# Patient Record
Sex: Male | Born: 1955
Health system: Southern US, Community
[De-identification: ages and names within clinical notes are randomized; demographics above are authoritative.]

## PROBLEM LIST (undated history)

## (undated) DIAGNOSIS — Z959 Presence of cardiac and vascular implant and graft, unspecified: Secondary | ICD-10-CM

## (undated) DIAGNOSIS — J309 Allergic rhinitis, unspecified: Secondary | ICD-10-CM

## (undated) DIAGNOSIS — I219 Acute myocardial infarction, unspecified: Secondary | ICD-10-CM

## (undated) DIAGNOSIS — I10 Essential (primary) hypertension: Secondary | ICD-10-CM

## (undated) DIAGNOSIS — E785 Hyperlipidemia, unspecified: Secondary | ICD-10-CM

## (undated) DIAGNOSIS — M1990 Unspecified osteoarthritis, unspecified site: Secondary | ICD-10-CM

## (undated) DIAGNOSIS — F411 Generalized anxiety disorder: Secondary | ICD-10-CM

## (undated) DIAGNOSIS — M545 Low back pain, unspecified: Secondary | ICD-10-CM

## (undated) DIAGNOSIS — Z95810 Presence of automatic (implantable) cardiac defibrillator: Secondary | ICD-10-CM

## (undated) DIAGNOSIS — I2510 Atherosclerotic heart disease of native coronary artery without angina pectoris: Secondary | ICD-10-CM

## (undated) DIAGNOSIS — I480 Paroxysmal atrial fibrillation: Secondary | ICD-10-CM

## (undated) DIAGNOSIS — I469 Cardiac arrest, cause unspecified: Secondary | ICD-10-CM

## (undated) HISTORY — DX: Hyperlipidemia, unspecified: E78.5

## (undated) HISTORY — DX: Cardiac arrest, cause unspecified: I46.9

## (undated) HISTORY — DX: Essential (primary) hypertension: I10

## (undated) HISTORY — DX: Presence of cardiac and vascular implant and graft, unspecified: Z95.9

## (undated) HISTORY — DX: Generalized anxiety disorder: F41.1

## (undated) HISTORY — DX: Allergic rhinitis, unspecified: J30.9

## (undated) HISTORY — DX: Paroxysmal atrial fibrillation: I48.0

## (undated) HISTORY — DX: Low back pain: M54.5

## (undated) HISTORY — DX: Low back pain, unspecified: M54.50

---

## 2003-07-31 ENCOUNTER — Emergency Department (HOSPITAL_COMMUNITY): Admission: EM | Admit: 2003-07-31 | Discharge: 2003-07-31 | Payer: Self-pay | Admitting: *Deleted

## 2003-08-07 ENCOUNTER — Emergency Department (HOSPITAL_COMMUNITY): Admission: EM | Admit: 2003-08-07 | Discharge: 2003-08-07 | Payer: Self-pay | Admitting: Emergency Medicine

## 2004-11-15 ENCOUNTER — Ambulatory Visit: Payer: Self-pay | Admitting: Internal Medicine

## 2004-11-30 ENCOUNTER — Ambulatory Visit: Payer: Self-pay | Admitting: Internal Medicine

## 2005-07-17 ENCOUNTER — Ambulatory Visit: Payer: Self-pay | Admitting: Internal Medicine

## 2005-11-27 ENCOUNTER — Emergency Department (HOSPITAL_COMMUNITY): Admission: EM | Admit: 2005-11-27 | Discharge: 2005-11-27 | Payer: Self-pay | Admitting: Emergency Medicine

## 2005-11-29 ENCOUNTER — Ambulatory Visit: Payer: Self-pay | Admitting: Internal Medicine

## 2006-03-13 ENCOUNTER — Ambulatory Visit: Payer: Self-pay | Admitting: Internal Medicine

## 2007-04-02 ENCOUNTER — Ambulatory Visit: Payer: Self-pay | Admitting: Internal Medicine

## 2007-04-02 LAB — CONVERTED CEMR LAB
ALT: 18 units/L (ref 0–40)
AST: 23 units/L (ref 0–37)
Albumin: 4.2 g/dL (ref 3.5–5.2)
Alkaline Phosphatase: 84 units/L (ref 39–117)
BUN: 5 mg/dL — ABNORMAL LOW (ref 6–23)
Bacteria, UA: NEGATIVE
Basophils Absolute: 0 10*3/uL (ref 0.0–0.1)
Basophils Relative: 0.3 % (ref 0.0–1.0)
Bilirubin Urine: NEGATIVE
Bilirubin, Direct: 0.1 mg/dL (ref 0.0–0.3)
CO2: 31 meq/L (ref 19–32)
Calcium: 9.3 mg/dL (ref 8.4–10.5)
Chloride: 107 meq/L (ref 96–112)
Cholesterol: 240 mg/dL (ref 0–200)
Creatinine, Ser: 0.7 mg/dL (ref 0.4–1.5)
Crystals: NEGATIVE
Direct LDL: 169.7 mg/dL
Eosinophils Absolute: 0 10*3/uL (ref 0.0–0.6)
Eosinophils Relative: 0.4 % (ref 0.0–5.0)
GFR calc Af Amer: 154 mL/min
GFR calc non Af Amer: 127 mL/min
Glucose, Bld: 95 mg/dL (ref 70–99)
HCT: 45.4 % (ref 39.0–52.0)
HDL: 45.4 mg/dL (ref >39.0)
Hemoglobin, Urine: NEGATIVE
Hemoglobin: 15.5 g/dL (ref 13.0–17.0)
Ketones, ur: NEGATIVE mg/dL
Leukocytes, UA: NEGATIVE
Lymphocytes Relative: 27.2 % (ref 12.0–46.0)
MCHC: 34.3 g/dL (ref 30.0–36.0)
MCV: 96.4 fL (ref 78.0–100.0)
Monocytes Absolute: 0.8 10*3/uL — ABNORMAL HIGH (ref 0.2–0.7)
Monocytes Relative: 8.8 % (ref 3.0–11.0)
Mucus, UA: NEGATIVE
Neutro Abs: 5.7 10*3/uL (ref 1.4–7.7)
Neutrophils Relative %: 63.3 % (ref 43.0–77.0)
Nitrite: NEGATIVE
PSA: 0.31 ng/mL (ref 0.10–4.00)
Platelets: 276 10*3/uL (ref 150–400)
Potassium: 4.3 meq/L (ref 3.5–5.1)
RBC / HPF: NONE SEEN
RBC: 4.71 M/uL (ref 4.22–5.81)
RDW: 13.1 % (ref 11.5–14.6)
Sodium: 143 meq/L (ref 135–145)
Specific Gravity, Urine: <=1.005 (ref 1.000–1.03)
Squamous Epithelial / LPF: NEGATIVE /lpf
TSH: 1.53 microintl units/mL (ref 0.35–5.50)
Total Bilirubin: 0.7 mg/dL (ref 0.3–1.2)
Total CHOL/HDL Ratio: 5.3
Total Protein, Urine: NEGATIVE mg/dL
Total Protein: 7.4 g/dL (ref 6.0–8.3)
Triglycerides: 147 mg/dL (ref 0–149)
Urine Glucose: NEGATIVE mg/dL
Urobilinogen, UA: 0.2 (ref 0.0–1.0)
VLDL: 29 mg/dL (ref 0–40)
WBC, UA: NONE SEEN cells/hpf
WBC: 8.9 10*3/uL (ref 4.5–10.5)
pH: 6.5 (ref 5.0–8.0)

## 2007-07-15 ENCOUNTER — Ambulatory Visit: Payer: Self-pay | Admitting: Internal Medicine

## 2007-07-15 LAB — CONVERTED CEMR LAB
Cholesterol: 200 mg/dL (ref 0–200)
HDL: 49.4 mg/dL (ref >39.0)
LDL Cholesterol: 133 mg/dL — ABNORMAL HIGH (ref 0–99)
Total CHOL/HDL Ratio: 4
Triglycerides: 87 mg/dL (ref 0–149)
VLDL: 17 mg/dL (ref 0–40)

## 2007-07-16 DIAGNOSIS — M545 Low back pain, unspecified: Secondary | ICD-10-CM | POA: Insufficient documentation

## 2007-07-16 DIAGNOSIS — F411 Generalized anxiety disorder: Secondary | ICD-10-CM | POA: Insufficient documentation

## 2007-07-16 DIAGNOSIS — J309 Allergic rhinitis, unspecified: Secondary | ICD-10-CM | POA: Insufficient documentation

## 2007-07-16 DIAGNOSIS — I10 Essential (primary) hypertension: Secondary | ICD-10-CM | POA: Insufficient documentation

## 2007-09-03 ENCOUNTER — Ambulatory Visit: Payer: Self-pay | Admitting: Internal Medicine

## 2007-09-03 LAB — CONVERTED CEMR LAB
ALT: 24 units/L (ref 0–53)
AST: 27 units/L (ref 0–37)
Albumin: 4 g/dL (ref 3.5–5.2)
Alkaline Phosphatase: 102 units/L (ref 39–117)
Bilirubin, Direct: 0.1 mg/dL (ref 0.0–0.3)
Cholesterol: 194 mg/dL (ref 0–200)
HDL: 38.3 mg/dL — ABNORMAL LOW (ref >39.0)
LDL Cholesterol: 122 mg/dL — ABNORMAL HIGH (ref 0–99)
Total Bilirubin: 0.6 mg/dL (ref 0.3–1.2)
Total CHOL/HDL Ratio: 5.1
Total Protein: 7.6 g/dL (ref 6.0–8.3)
Triglycerides: 171 mg/dL — ABNORMAL HIGH (ref 0–149)
VLDL: 34 mg/dL (ref 0–40)

## 2008-08-04 ENCOUNTER — Telehealth: Payer: Self-pay | Admitting: Internal Medicine

## 2009-09-01 ENCOUNTER — Ambulatory Visit: Payer: Self-pay | Admitting: Internal Medicine

## 2009-10-07 ENCOUNTER — Ambulatory Visit: Payer: Self-pay | Admitting: Internal Medicine

## 2009-12-24 ENCOUNTER — Ambulatory Visit: Payer: Self-pay | Admitting: Internal Medicine

## 2009-12-24 DIAGNOSIS — E785 Hyperlipidemia, unspecified: Secondary | ICD-10-CM | POA: Insufficient documentation

## 2009-12-25 LAB — CONVERTED CEMR LAB
ALT: 26 units/L (ref 0–53)
AST: 28 units/L (ref 0–37)
Albumin: 4.5 g/dL (ref 3.5–5.2)
Alkaline Phosphatase: 85 units/L (ref 39–117)
BUN: 11 mg/dL (ref 6–23)
Basophils Absolute: 0 10*3/uL (ref 0.0–0.1)
Basophils Relative: 0 % (ref 0.0–3.0)
Bilirubin Urine: NEGATIVE
Bilirubin, Direct: <0.1 mg/dL (ref 0.0–0.3)
CO2: 19 meq/L (ref 19–32)
Calcium: 8.9 mg/dL (ref 8.4–10.5)
Chloride: 105 meq/L (ref 96–112)
Cholesterol: 193 mg/dL (ref 0–200)
Creatinine, Ser: 0.67 mg/dL (ref 0.40–1.50)
Eosinophils Absolute: 0.1 10*3/uL (ref 0.0–0.7)
Eosinophils Relative: 1.1 % (ref 0.0–5.0)
Glucose, Bld: 71 mg/dL (ref 70–99)
HCT: 44.5 % (ref 39.0–52.0)
HDL: 39 mg/dL — ABNORMAL LOW (ref >39)
Hemoglobin, Urine: NEGATIVE
Hemoglobin: 14.6 g/dL (ref 13.0–17.0)
Ketones, ur: NEGATIVE mg/dL
LDL Cholesterol: 120 mg/dL — ABNORMAL HIGH (ref 0–99)
Leukocytes, UA: NEGATIVE
Lymphocytes Relative: 33.1 % (ref 12.0–46.0)
Lymphs Abs: 2.8 10*3/uL (ref 0.7–4.0)
MCHC: 32.9 g/dL (ref 30.0–36.0)
MCV: 98.8 fL (ref 78.0–100.0)
Monocytes Absolute: 0.7 10*3/uL (ref 0.1–1.0)
Monocytes Relative: 7.7 % (ref 3.0–12.0)
Neutro Abs: 4.9 10*3/uL (ref 1.4–7.7)
Neutrophils Relative %: 58.1 % (ref 43.0–77.0)
Nitrite: NEGATIVE
PSA: 0.43 ng/mL (ref 0.10–4.00)
Platelets: 254 10*3/uL (ref 150.0–400.0)
Potassium: 4.6 meq/L (ref 3.5–5.3)
RBC: 4.5 M/uL (ref 4.22–5.81)
RDW: 12.4 % (ref 11.5–14.6)
Sodium: 141 meq/L (ref 135–145)
Specific Gravity, Urine: <=1.005 (ref 1.000–1.030)
TSH: 0.92 microintl units/mL (ref 0.35–5.50)
Total Bilirubin: 0.5 mg/dL (ref 0.3–1.2)
Total CHOL/HDL Ratio: 4.9
Total Protein, Urine: NEGATIVE mg/dL
Total Protein: 8 g/dL (ref 6.0–8.3)
Triglycerides: 171 mg/dL — ABNORMAL HIGH (ref <150)
Urine Glucose: NEGATIVE mg/dL
Urobilinogen, UA: 0.2 (ref 0.0–1.0)
VLDL: 34 mg/dL (ref 0–40)
WBC: 8.5 10*3/uL (ref 4.5–10.5)
pH: 6.5 (ref 5.0–8.0)

## 2010-05-13 ENCOUNTER — Ambulatory Visit: Payer: Self-pay | Admitting: Internal Medicine

## 2010-10-21 ENCOUNTER — Ambulatory Visit: Payer: Self-pay | Admitting: Internal Medicine

## 2010-12-08 ENCOUNTER — Ambulatory Visit
Admission: RE | Admit: 2010-12-08 | Discharge: 2010-12-08 | Payer: Self-pay | Source: Home / Self Care | Attending: Internal Medicine | Admitting: Internal Medicine

## 2011-01-10 NOTE — Progress Notes (Signed)
Summary: flexeril denial  Phone Note Refill Request Message from:  Fax from Pharmacy on August 04, 2008 12:25 PM  Refills Requested: Medication #1:  Cyclobenzaprine 5mg    Notes: TID Not on pt's EMR med list, pt last seen 09/03/07. Is this ok [facility] [address] fax [phone]  Initial call taken by: Rock Nephew CMA,  August 04, 2008 12:27 PM  Follow-up for Phone Call        pt not seen for > 1 yr - this is controlled med - needs ROV Follow-up by: Corwin Levins MD,  August 04, 2008 12:34 PM  Additional Follow-up for Phone Call Additional follow up Details #1::        pharmacy notified Additional Follow-up by: Rock Nephew CMA,  August 04, 2008 1:28 PM

## 2011-01-10 NOTE — Assessment & Plan Note (Signed)
Summary: headache/face pain/cd   Vital Signs:  Patient profile:   55 year old male Height:      72 inches Weight:      175.50 pounds BMI:     23.89 O2 Sat:      96 % on Room air Temp:     97.7 degrees F oral Pulse rate:   78 / minute BP sitting:   152 / 80  (left arm) Cuff size:   regular  Vitals Entered By: Zella Ball Ewing CMA Duncan Dull) (October 21, 2010 2:17 PM)  O2 Flow:  Room air  CC: Headache, face pain, sore throat, right ear pain/RE   Primary Care Provider:  Corwin Levins MD  CC:  Headache, face pain, sore throat, and right ear pain/RE.  History of Present Illness: here with acute onset mild to mod 2-3 days facial pain, pressure, fever, headache,  and greenish d/c; with mild ST, no cough and Pt denies CP, worsening sob, doe, wheezing, orthopnea, pnd, worsening LE edema, palps, dizziness or syncope  Pt denies new neuro symptoms such as headache, facial or extremity weakness Pt denies polydipsia, polyuria.  No recent wt loss, night sweats, loss of appetite or other constitutional symptoms . Denies worsening depressive symptoms, suicidal ideation, or panic, and some less anxious recently after he has been able to transition to a new job.  BP at home < 140/90.  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    Problems Prior to Update: 1)  Sinusitis- Acute-nos  (ICD-461.9) 2)  Blepharitis, Unspecified  (ICD-373.00) 3)  Hyperlipidemia  (ICD-272.4) 4)  Otitis Media, Right  (ICD-382.9) 5)  Preventive Health Care  (ICD-V70.0) 6)  Sialadenitis, Left  (ICD-527.2) 7)  Hypertension  (ICD-401.9) 8)  Low Back Pain  (ICD-724.2) 9)  Anxiety  (ICD-300.00) 10)  Allergic Rhinitis  (ICD-477.9)  Medications Prior to Update: 1)  Alprazolam 0.25 Mg  Tbdp (Alprazolam) .Marland Kitchen.. 1 By Mouth Two Times A Day Prn 2)  Flexeril 5 Mg Tabs (Cyclobenzaprine Hcl) .Marland Kitchen.. 1 By Mouth Three Times A Day As  Needed 3)  Doxycycline Hyclate 100 Mg Caps (Doxycycline Hyclate) .Marland Kitchen.. 1po Two Times A Day 4)  Erythromycin 5 Mg/gm Oint (Erythromycin) .... Use Asd Four Times Per Day For 10 Days  Current Medications (verified): 1)  Alprazolam 0.25 Mg  Tbdp (Alprazolam) .Marland Kitchen.. 1 By Mouth Two Times A Day Prn 2)  Flexeril 5 Mg Tabs (Cyclobenzaprine Hcl) .Marland Kitchen.. 1 By Mouth Three Times A Day As Needed 3)  Levofloxacin 500 Mg Tabs (Levofloxacin) .Marland Kitchen.. 1po Once Daily  Allergies (verified): 1)  ! 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  , mild ill  Head:  normocephalic and atraumatic.   Eyes:  vision grossly intact, pupils equal, and pupils round.   Ears:  right and left TM's mild erythema, canals clear, sinus tender bilat Nose:  nasal dischargemucosal pallor and mucosal edema.   Mouth:  pharyngeal erythema  and fair dentition.   Neck:  supple and no masses.   Lungs:  normal respiratory effort and normal breath sounds.   Heart:  normal rate and regular rhythm.   Msk:  no joint tenderness and no joint swelling.  , spine nontender, mild bilat lumbar paravertebral tender noted Extremities:  no edema, no erythema  Neurologic:  strength normal in all extremities and gait normal.   Psych:  not anxious appearing and not depressed appearing.     Impression & Recommendations:  Problem # 1:  SINUSITIS- ACUTE-NOS (ICD-461.9)  His updated medication list for this problem includes:    Levofloxacin 500 Mg Tabs (Levofloxacin)  .Marland Kitchen... 1po once daily treat as above, f/u any worsening signs or symptoms   Problem # 2:  ANXIETY (ICD-300.00)  His updated medication list for this problem includes:    Alprazolam 0.25 Mg Tbdp (Alprazolam) .Marland Kitchen... 1 by mouth two times a day prn stable overall by hx and exam, ok to continue meds/tx as is   Problem # 3:  LOW BACK PAIN (ICD-724.2)  His updated medication list for this problem includes:    Flexeril 5 Mg Tabs (Cyclobenzaprine hcl) .Marland Kitchen... 1 by mouth three times a day as needed stable overall by hx and exam, ok to continue meds/tx as is   Problem # 4:  HYPERTENSION (ICD-401.9)  BP today: 152/80 Prior BP: 150/100 (05/13/2010)  Labs Reviewed: K+: 4.6 (12/24/2009) Creat: : 0.67 (12/24/2009)   Chol: 193 (12/24/2009)   HDL: 39 (12/24/2009)   LDL: 120 (12/24/2009)   TG: 171 (12/24/2009) I suspect he  needs BP med, pt decines, mild elev today, ? situational, to follow closely , continue same treatment  - avoid salt, wt control, regular excercise, ETOH in moderation  Complete Medication List: 1)  Alprazolam 0.25 Mg Tbdp (Alprazolam) .Marland Kitchen.. 1 by mouth two times a day prn 2)  Flexeril 5 Mg Tabs (Cyclobenzaprine hcl) .Marland Kitchen.. 1 by mouth three times a day as needed 3)  Levofloxacin 500 Mg Tabs (Levofloxacin) .Marland Kitchen.. 1po once daily  Patient Instructions: 1)  Please take all new medications as prescribed 2)  Continue all previous medications as before this visit  3)  You can also use Mucinex OTC or it's generic for congestion 4)  Check your Blood Pressure regularly. If it is above 140/90: you should make an appointment. 5)  Please schedule a follow-up appointment in 2 months for CPX with labs Prescriptions: FLEXERIL 5 MG TABS (CYCLOBENZAPRINE HCL) 1 by mouth three times a day as needed  #60 x 1   Entered and Authorized by:   Corwin Levins MD   Signed by:   Corwin Levins MD on 10/21/2010   Method used:   Print then Give to Patient   RxID:   [id number] ALPRAZOLAM 0.25 MG  TBDP  (ALPRAZOLAM) 1 by mouth two times a day prn  #60 x 1   Entered and Authorized by:   Corwin Levins MD   Signed by:   Corwin Levins MD on 10/21/2010   Method used:   Print then Give to Patient   RxID:   [id number] LEVOFLOXACIN 500 MG TABS (LEVOFLOXACIN) 1po once daily  #10 x 0   Entered and Authorized by:   Corwin Levins MD   Signed by:   Corwin Levins MD on 10/21/2010   Method used:   Print then Give to Patient   RxID:   [id number]    Orders Added: 1)  Est. Patient Level  IV [id number]

## 2011-01-10 NOTE — Assessment & Plan Note (Signed)
Summary: SOMETHING IN EYE OR SOMETHING BIT HIM / EYE IS SWOLLEN/NWS   Vital Signs:  Patient profile:   55 year old male Height:      72 inches Weight:      171 pounds BMI:     23.28 O2 Sat:      98 % on Room air Temp:     97.3 degrees F oral BP sitting:   150 / 100  (left arm) Cuff size:   regular  Vitals Entered ByZella Ball Ewing (May 13, 2010 10:24 AM)  O2 Flow:  Room air  CC: Left eye red, swollen for 1 week/RE   Referring Provider:  21  Primary Care Provider:  Corwin Levins MD  CC:  Left eye red and swollen for 1 week/RE.  History of Present Illness: overall doing ok, except for new onset 1 wk gradually worsening eyelid red, pain, tender and sweling with a small aspect of the mid lower lid with a firmness to it;  has slight itching but more pain than itch, no other rash to arms or other to suggest contact derm;  and has some watering of the eye as well (clearish) without vision problem or blurriness;  no high fever, chills, headache, ST, cough and Pt denies CP, sob, doe, wheezing, orthopnea, pnd, worsening LE edema, palps, dizziness or syncope   Overall good compliance with meds, good tolerance as well.    Preventive Screening-Counseling & Management      Drug Use:  no.    Problems Prior to Update: 1)  Blepharitis, Unspecified  (ICD-373.00) 2)  Hyperlipidemia  (ICD-272.4) 3)  Otitis Media, Right  (ICD-382.9) 4)  Preventive Health Care  (ICD-V70.0) 5)  Sialadenitis, Left  (ICD-527.2) 6)  Hypertension  (ICD-401.9) 7)  Low Back Pain  (ICD-724.2) 8)  Anxiety  (ICD-300.00) 9)  Allergic Rhinitis  (ICD-477.9)  Medications Prior to Update: 1)  Alprazolam 0.25 Mg  Tbdp (Alprazolam) .Marland Kitchen.. 1 By Mouth Two Times A Day Prn 2)  Flexeril 5 Mg Tabs (Cyclobenzaprine Hcl) .Marland Kitchen.. 1 By Mouth Three Times A Day As Needed 3)  Clarithromycin 500 Mg Tabs (Clarithromycin) .Marland Kitchen.. 1po Two Times A Day  Current Medications (verified): 1)  Alprazolam 0.25 Mg  Tbdp (Alprazolam) .Marland Kitchen.. 1 By Mouth Two  Times A Day Prn 2)  Flexeril 5 Mg Tabs (Cyclobenzaprine Hcl) .Marland Kitchen.. 1 By Mouth Three Times A Day As Needed 3)  Doxycycline Hyclate 100 Mg Caps (Doxycycline Hyclate) .Marland Kitchen.. 1po Two Times A Day 4)  Erythromycin 5 Mg/gm Oint (Erythromycin) .... Use Asd Four Times Per Day For 10 Days  Allergies (verified): 1)  ! 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   Head:  normocephalic and atraumatic.   Eyes:  vision grossly intact, pupils equal, and pupils round;  lower left lid with 2+ red, tender , swelling with very  small approx 5 mm?  firm area to mid lower lid adjacent to the eyelid edge   Ears:  R ear normal and L ear normal.   Nose:  no external deformity and no nasal discharge.   Mouth:  no gingival abnormalities and pharynx pink and moist.   Neck:  supple and no masses.   Lungs:  normal respiratory effort and normal breath sounds.   Heart:  normal rate and regular rhythm.   Extremities:  no edema, no erythema    Impression & Recommendations:  Problem # 1:  BLEPHARITIS, UNSPECIFIED (ICD-373.00) mild, for antibx treatment, has ? evolving hordoleum, but doubt abscess; to follow closely for any worsening symptoms  Problem # 2:  HYPERTENSION (ICD-401.9)  mild elev today, likely situational, ok to  follow, continue same treatment   BP today: 150/100 Prior BP: 138/88 (12/24/2009)  Labs Reviewed: K+: 4.6 (12/24/2009) Creat: : 0.67 (12/24/2009)   Chol: 193 (12/24/2009)   HDL: 39 (12/24/2009)   LDL: 120 (12/24/2009)   TG: 171 (12/24/2009)  Complete Medication List: 1)  Alprazolam 0.25 Mg Tbdp (Alprazolam) .Marland Kitchen.. 1 by mouth two times a day prn 2)  Flexeril 5 Mg Tabs (Cyclobenzaprine hcl) .Marland Kitchen.. 1 by mouth three times a day as needed 3)  Doxycycline Hyclate 100 Mg Caps (Doxycycline hyclate) .Marland Kitchen.. 1po two times a day 4)  Erythromycin 5 Mg/gm Oint (Erythromycin) .... Use asd four times per day for 10 days  Patient Instructions: 1)  Please take all new medications as prescribed 2)  Continue all previous medications as before this visit  3)  Please schedule a follow-up appointment in Jan 2012 with CPX labs Prescriptions: ERYTHROMYCIN 5 MG/GM OINT (ERYTHROMYCIN) use asd four times per day for 10 days  #1 x 0   Entered and Authorized by:   Corwin Levins MD   Signed by:   Corwin Levins MD on 05/13/2010   Method used:   Print then Give to Patient   RxID:   [id number] DOXYCYCLINE HYCLATE 100 MG CAPS (DOXYCYCLINE HYCLATE) 1po two times a day  #20 x 0   Entered and Authorized by:   Corwin Levins MD   Signed by:   Corwin Levins MD on 05/13/2010   Method used:   Print then Give to Patient   RxID:   [id number]

## 2011-01-10 NOTE — Assessment & Plan Note (Signed)
Summary: DR Sammuel Cooper PT  EAR ACHE-SORE THROAT-FEVER-X 2 DYS-#-STC   Vital Signs:  Patient profile:   55 year old male O2 Sat:      97 % Temp:     98.5 degrees F (36.94 degrees C) oral Pulse rate:   67 / minute BP sitting:   172 / 100  (left arm) Cuff size:   regular  Vitals Entered By: Orlan Leavens (September 01, 2009 10:28 AM) CC: earache/ sore throat/ fever x's 3 days Is Patient Diabetic? No Pain Assessment Patient in pain? no        Primary Care Provider:  Corwin Levins MD  CC:  earache/ sore throat/ fever x's 3 days.  History of Present Illness: here with earache and soreness to left side of face onset 3 days ago +fever this AM a/w sore throat no cough or chest pain no sneezing or nasal discharge no drainage from ear no teeth pain or recent dental work improved pain with OTC advil x 1 this AM worsening pain symptoms each day with increasing swelling at "jaw bend"  Current Medications (verified): 1)  Alprazolam 0.25 Mg  Tbdp (Alprazolam) .Marland Kitchen.. 1 By Mouth Two Times A Day Prn  Allergies (verified): 1)  ! Demerol  Past History:  Past Medical History: Allergic rhinitis Anxiety Low back pain Hypertension  Social History: works at [employer] lives with wife and teenage dtr  Review of Systems  The patient denies decreased hearing, hoarseness, headaches, and suspicious skin lesions.    Physical Exam  General:  alert, well-developed, well-nourished, and cooperative to examination.    Head:  +tender diffuse firm swelling over left parotid region - no individual mass appreciated within gland Eyes:  vision grossly intact; pupils equal, round and reactive to light.  conjunctiva and lids normal.    Ears:  erythema of left ear canal without exudate or drainage - TMs hazy but no effusion bilaterally - Nose:  External nasal examination shows no deformity or inflammation. Nasal mucosa are pink and moist without lesions or exudates. Mouth:  teeth and gums in good  repair; mucous membranes moist, without lesions or ulcers. oropharynx clear without exudate, erythema. unable to express pus or stone from left parotid salivary gland bit +erythema and inflammation in the region Lungs:  normal respiratory effort, no intercostal retractions or use of accessory muscles; normal breath sounds bilaterally - no crackles and no wheezes.    Heart:  normal rate, regular rhythm, no murmur, and no rub. BLE without edema   Impression & Recommendations:  Problem # 1:  SIALADENITIS, LEFT (ICD-527.2)  a/w pain, fever and swelling over left parotid region concerning for infection unable to express salivary stone today treat with 7 d abx and silogouges to increase salivary production - push by mouth fluids followup if continued fever to r/o abcess and get ENT referral but plan emperic tx without consultation at this time as nontoxic  Orders: Prescription Created Electronically [phone])  Problem # 2:  HYPERTENSION (ICD-401.9) pt denies dx - "i am just stressed and sick" reviewed criteria of HTN dx and medical risks of untreated HTN - pt agrees to follow up with PCP in 4 weeks and consider tx if still high educated on DASH diet and need for exercise + weight control BP today: 172/100  Labs Reviewed: K+: 4.3 (04/02/2007) Creat: : 0.7 (04/02/2007)   Chol: 194 (09/03/2007)   HDL: 38.3 (09/03/2007)   LDL: 122 (09/03/2007)   TG: 171 (09/03/2007)  Complete Medication List: 1)  Alprazolam 0.25 Mg Tbdp (Alprazolam) .Marland Kitchen.. 1 by mouth two times a day prn 2)  Augmentin 875-125 Mg Tabs (Amoxicillin-pot clavulanate) .Marland Kitchen.. 1 by mouth two times a day 3)  Pilocarpine Hcl 5 Mg Tabs (Pilocarpine hcl) .Marland Kitchen.. 1 by mouth three times a day  Patient Instructions: 1)  take augmentin (antibiotic) as instructed for the infection (sorry - no once daily alternative!) 2)  Take 400-600mg  of Ibuprofen (Advil, Motrin) with food every 4-6 hours as needed for relief of pain or comfort of fever. 3)  also  take piilocarpine three times a day to stimulate saliva production to help clear infection and drink as much fluid as you can tolerate for the next few days. 4)  May place ice pack to left chin/cheek area for pain/swelling as needed  5)  Take your antibiotic as prescribed until ALL of it is gone, but stop if you develop a rash or swelling and contact our office as soon as possible. 6)  Please schedule a follow-up appointment in 1 month to recheck blood pressure, sooner if problems. Prescriptions: PILOCARPINE HCL 5 MG TABS (PILOCARPINE HCL) 1 by mouth three times a day  #21 x 0   Entered and Authorized by:   Newt Lukes MD   Signed by:   Newt Lukes MD on 09/01/2009   Method used:   [facility] to        [facility] [phone][facility] (retail)       [address], [place]  [address]       Ph: [phone] or [phone]       Fax: [phone]   RxID:   [id number] AUGMENTIN 875-125 MG TABS (AMOXICILLIN-POT CLAVULANATE) 1 by mouth two times a day  #14 x 0   Entered and Authorized by:   Newt Lukes MD   Signed by:   Newt Lukes MD on 09/01/2009   Method used:   [facility]        [facility] [phone]* (retail)       [address], [place]  [address]       Ph: [phone] or [phone]       Fax: [phone]   RxID:   [id number]    Orders Added: 1)  Prescription Created Electronically [G8553] 2)  Est. Patient Level IV [02585]    Immunization History:  Tetanus/Td Immunization History:    Tetanus/Td:  historical (12/11/2005)

## 2011-01-10 NOTE — Assessment & Plan Note (Signed)
Summary: SORE THROAT/ SWOLLEN GLANDS/NWS  #   Vital Signs:  Patient profile:   55 year old male Height:      72 inches Weight:      174 pounds BMI:     23.68 O2 Sat:      97 % on Room air Temp:     97.2 degrees F oral Pulse rate:   73 / minute BP sitting:   138 / 88  (right arm) Cuff size:   large  Vitals Entered ByMarland Kitchen Zella Ball Ewing (December 24, 2009 12:21 PM)  O2 Flow:  Room air  Primary Care Provider:  Corwin Levins MD   History of Present Illness: overall doing well, incidently here with right earache, fever and congestion wtihout vertigo, dizziness or hearing loss; does have some right periauricular tender as well without erythema, swelling or ulceration;  no sinus symptom, ST, and Pt denies CP, sob, doe, wheezing, orthopnea, pnd, worsening LE edema, palps, dizziness or syncope   Pt denies new neuro symptoms such as headache, facial or extremity weakness .  Not really trying to follow low chol diet.  Problems Prior to Update: 1)  Otitis Media, Right  (ICD-382.9) 2)  Preventive Health Care  (ICD-V70.0) 3)  Sialadenitis, Left  (ICD-527.2) 4)  Hypertension  (ICD-401.9) 5)  Low Back Pain  (ICD-724.2) 6)  Anxiety  (ICD-300.00) 7)  Allergic Rhinitis  (ICD-477.9)  Medications Prior to Update: 1)  Alprazolam 0.25 Mg  Tbdp (Alprazolam) .Marland Kitchen.. 1 By Mouth Two Times A Day Prn  Current Medications (verified): 1)  Alprazolam 0.25 Mg  Tbdp (Alprazolam) .Marland Kitchen.. 1 By Mouth Two Times A Day Prn 2)  Flexeril 5 Mg Tabs (Cyclobenzaprine Hcl) .Marland Kitchen.. 1 By Mouth Three Times A Day As Needed 3)  Clarithromycin 500 Mg Tabs (Clarithromycin) .Marland Kitchen.. 1po Two Times A Day  Allergies (verified): 1)  ! Demerol  Past History:  Past Surgical History: Last updated: 07/16/2007 Denies surgical history  Social History: Last updated: 12/24/2009 works at [employer] lives with wife and teenage dtr work - [employer]  Risk Factors: Smoking Status: current (07/16/2007)  Past Medical History: Allergic  rhinitis Anxiety Low back pain Hypertension Hyperlipidemia  Family History: Reviewed history and no changes required. htn  Social History: Reviewed history from 09/01/2009 and no changes required. 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         all otherwise negative per pt -   Physical Exam  General:  alert and overweight-appearing.  , mild ill  Head:  normocephalic and atraumatic.   Eyes:  vision grossly intact, pupils equal, and pupils round.   Ears:  right TM severe erythema, left TM ok Nose:  no external deformity and no nasal discharge.   Mouth:  pharyngeal erythema and fair dentition.   Neck:  supple and no masses.   Lungs:  normal respiratory effort and normal breath sounds.   Heart:  normal rate and regular rhythm.   Abdomen:  soft, non-tender, and normal bowel sounds.   Msk:  no joint tenderness and no joint swelling.   Extremities:  no edema, no erythema  Neurologic:  cranial nerves II-XII intact and strength normal in all extremities.     Impression & Recommendations:  Problem # 1:  Preventive Health Care (ICD-V70.0)  Overall doing well, updated the age appropriate counseling and education;  routine health screening/prevention reviewed and done as appropriate unless declined, immunizations up to date or declined, diet counseling done if overweight, urged to quit smoking if smokes , most recent labs reviewed and current ordered if appropriate, ecg  reviewed or declined   Orders: TLB-BMP (Basic Metabolic Panel-BMET) (80048-METABOL) TLB-CBC Platelet - w/Differential (85025-CBCD) TLB-Hepatic/Liver Function Pnl (80076-HEPATIC) TLB-Lipid Panel (80061-LIPID) TLB-TSH (Thyroid Stimulating Hormone) (84443-TSH) TLB-PSA (Prostate Specific Antigen) (84153-PSA) TLB-Udip ONLY (81003-UDIP)  Problem # 2:  OTITIS MEDIA, RIGHT (ICD-382.9)  His updated medication list for this problem includes:    Clarithromycin 500 Mg Tabs (Clarithromycin) .Marland Kitchen... 1po two times a day treat as above, f/u any worsening signs or symptoms   Problem # 3:  HYPERTENSION (ICD-401.9)  BP today: 138/88 Prior BP: 172/100 (09/01/2009)  Labs Reviewed: K+: 4.3 (04/02/2007) Creat: : 0.7 (04/02/2007)   Chol: 194 (09/03/2007)   HDL: 38.3 (09/03/2007)   LDL: 122 (09/03/2007)   TG: 171 (09/03/2007) stable overall by hx and exam, ok to continue meds/tx as is  - no meds needed at this time  Complete Medication List: 1)  Alprazolam 0.25 Mg Tbdp (Alprazolam) .Marland Kitchen.. 1 by mouth two times a day prn 2)  Flexeril 5 Mg Tabs (Cyclobenzaprine hcl) .Marland Kitchen.. 1 by mouth three times a day as needed 3)  Clarithromycin 500 Mg Tabs (Clarithromycin) .Marland Kitchen.. 1po two times a day  Patient Instructions: 1)  Please take all new medications as prescribed 2)  Continue all previous medications as before this visit 3)  Please go to the Lab in the basement for your blood and/or urine tests today 4)  Please schedule a follow-up appointment in 1 year or sooner if needed Prescriptions: CLARITHROMYCIN 500 MG TABS (CLARITHROMYCIN) 1po two times a day  #20 x 0   Entered and Authorized by:   Corwin Levins MD   Signed by:   Corwin Levins MD on 12/24/2009   Method used:   Print then Give to Patient   RxID:   [id number] FLEXERIL 5 MG TABS (CYCLOBENZAPRINE HCL) 1 by mouth three times a day as needed  #60 x 1   Entered and Authorized by:   Corwin Levins MD   Signed by:   Corwin Levins MD on 12/24/2009   Method used:    Print then Give to Patient   RxID:   [id number] ALPRAZOLAM 0.25 MG  TBDP (ALPRAZOLAM) 1 by mouth two times a day prn  #60 x 2   Entered and Authorized by:   Corwin Levins MD   Signed by:   Corwin Levins MD on 12/24/2009   Method used:   Print then Give to Patient   RxID:   [id number]   Appended Document: Orders Update    Clinical Lists Changes  Orders: Added new Test order of T- * Misc. Laboratory test [phone]) - Signed

## 2011-01-12 ENCOUNTER — Other Ambulatory Visit: Payer: Self-pay

## 2011-01-12 ENCOUNTER — Ambulatory Visit: Admit: 2011-01-12 | Payer: Self-pay | Admitting: Internal Medicine

## 2011-01-12 NOTE — Assessment & Plan Note (Signed)
Summary: CONGESTION-COUGH--DIZZY---STC   Vital Signs:  Patient profile:   55 year old male Height:      72 inches Weight:      177 pounds BMI:     24.09 O2 Sat:      97 % on Room air Temp:     98.4 degrees F oral Pulse rate:   96 / minute BP sitting:   142 / 90  (left arm) Cuff size:   regular  Vitals Entered By: Zella Ball Ewing CMA Duncan Dull) (December 08, 2010 3:46 PM)  O2 Flow:  Room air  CC: Congestion, cough, light headed, nausea and ears stopped up/RE   Primary Care Provider:  Corwin Levins MD  CC:  Congestion, cough, light headed, and nausea and ears stopped up/RE.  History of Present Illness: here with 3 days mild to mod fever, facial pain, pressure and greenish d/c , on top of 2-3 months ongoing nasal allergy symtpoms with clearish d/c, no fever, no pain, and not taking any antihist or other meds.  Has some ear popping prior to more recent symptoms, but no hearing loss, vertigo, n/v.  Pt denies CP, worsening sob, doe, wheezing, orthopnea, pnd, worsening LE edema, palps, dizziness or syncope  Pt denies new neuro symptoms such as headache, facial or extremity weakness  Pt denies polydipsia, polyuria.   Overall good compliance with meds, trying to follow low chol diet, wt stable, little excercise however   Problems Prior to Update: 1)  Sinusitis- Acute-nos  (ICD-461.9) 2)  Blepharitis, Unspecified  (ICD-373.00) 3)  Hyperlipidemia  (ICD-272.4) 4)  Otitis Media, Right  (ICD-382.9) 5)  Preventive Health Care  (ICD-V70.0) 6)  Sialadenitis, Left  (ICD-527.2) 7)  Hypertension  (ICD-401.9) 8)  Low Back Pain  (ICD-724.2) 9)  Anxiety  (ICD-300.00) 10)  Allergic Rhinitis  (ICD-477.9)  Medications Prior to Update: 1)  Alprazolam 0.25 Mg  Tbdp (Alprazolam) .Marland Kitchen.. 1 By Mouth Two Times A Day Prn 2)  Flexeril 5 Mg Tabs (Cyclobenzaprine Hcl) .Marland Kitchen.. 1 By Mouth Three Times A Day As Needed 3)  Levofloxacin 500 Mg Tabs (Levofloxacin) .Marland Kitchen.. 1po Once Daily  Current Medications (verified): 1)   Alprazolam 0.25 Mg  Tbdp (Alprazolam) .Marland Kitchen.. 1 By Mouth Two Times A Day Prn 2)  Flexeril 5 Mg Tabs (Cyclobenzaprine Hcl) .Marland Kitchen.. 1 By Mouth Three Times A Day As Needed 3)  Levofloxacin 500 Mg Tabs (Levofloxacin) .Marland Kitchen.. 1po Once Daily 4)  Promethazine Hcl 25 Mg Tabs (Promethazine Hcl) .Marland Kitchen.. 1 By Mouth Q 6 Hrs As Needed Nausea  Allergies (verified): 1)  ! 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  , mild ill  Head:  normocephalic and atraumatic.   Eyes:  vision grossly intact, pupils equal, and pupils round.   Ears:  right and left TM's mild erythema, canals clear, sinus tender bilat Nose:  nasal dischargemucosal pallor and mucosal edema.   Mouth:  pharyngeal erythema and fair dentition.   Neck:  supple and no masses.   Lungs:  normal respiratory effort and normal breath sounds.  Heart:  normal rate and regular rhythm.   Extremities:  no edema, no erythema    Impression & Recommendations:  Problem # 1:  SINUSITIS- ACUTE-NOS (ICD-461.9) Assessment Deteriorated  His updated medication list for this problem includes:    Levofloxacin 500 Mg Tabs (Levofloxacin) .Marland Kitchen... 1po once daily treat as above, f/u any worsening signs or symptoms   Problem # 2:  ALLERGIC RHINITIS (ICD-477.9) Assessment: Deteriorated  His updated medication list for this problem includes:    Promethazine Hcl 25 Mg Tabs (Promethazine hcl) .Marland Kitchen... 1 by mouth q 6 hrs as needed nausea ok for OTC allegra as needed , consider nasal  steroid or allergy referral if not improved  Problem # 3:  HYPERTENSION (ICD-401.9) Assessment: Deteriorated  BP today: 142/90 Prior BP: 152/80 (10/21/2010)  Labs Reviewed: K+: 4.6 (12/24/2009) Creat: : 0.67 (12/24/2009)   Chol: 193 (12/24/2009)   HDL: 39 (12/24/2009)   LDL: 120 (12/24/2009)   TG: 171 (12/24/2009) d/w pt - ? sustained HTN, declines meds at this time, but if remains mild elev should likely be tx with antiHTN meds;  to cont to monitor at home and next visit  Complete Medication List: 1)  Alprazolam 0.25 Mg Tbdp (Alprazolam) .Marland Kitchen.. 1 by mouth two times a day prn 2)  Flexeril 5 Mg Tabs (Cyclobenzaprine hcl) .Marland Kitchen.. 1 by mouth three times a day as needed 3)  Levofloxacin 500 Mg Tabs (Levofloxacin) .Marland Kitchen.. 1po once daily 4)  Promethazine Hcl 25 Mg Tabs (Promethazine hcl) .Marland Kitchen.. 1 by mouth q 6 hrs as needed nausea  Patient Instructions: 1)  Please take all new medications as prescribed 2)  Continue all previous medications as before this visit  3)  Please schedule a follow-up appointment in 1 month for CPX with labs Prescriptions: PROMETHAZINE HCL 25 MG TABS (PROMETHAZINE HCL) 1 by mouth q 6 hrs as needed nausea  #40 x 0   Entered and Authorized by:   Corwin Levins MD   Signed by:   Corwin Levins MD on 12/08/2010   Method used:   Print then Give to Patient   RxID:   [id number] LEVOFLOXACIN 500 MG TABS (LEVOFLOXACIN) 1po once daily  #10 x 0   Entered and Authorized by:   Corwin Levins MD   Signed by:   Corwin Levins MD on 12/08/2010   Method used:   Print then Give to Patient   RxID:   [id number]    Orders Added: 1)  Est. Patient Level IV [59563]

## 2011-01-19 ENCOUNTER — Encounter: Payer: Self-pay | Admitting: Internal Medicine

## 2011-11-07 ENCOUNTER — Other Ambulatory Visit: Payer: Self-pay | Admitting: Internal Medicine

## 2011-11-07 MED ORDER — CYCLOBENZAPRINE HCL 5 MG PO TABS: 5.0000 mg | ORAL_TABLET | Freq: Three times a day (TID) | ORAL | Status: AC | PRN

## 2011-11-07 NOTE — Telephone Encounter (Signed)
Done per pt request 

## 2012-01-25 ENCOUNTER — Ambulatory Visit (INDEPENDENT_AMBULATORY_CARE_PROVIDER_SITE_OTHER): Payer: Self-pay | Admitting: Endocrinology

## 2012-01-25 ENCOUNTER — Encounter: Payer: Self-pay | Admitting: Endocrinology

## 2012-01-25 VITALS — BP 148/90 | HR 43 | Temp 97.9°F | Wt 183.0 lb

## 2012-01-25 DIAGNOSIS — J069 Acute upper respiratory infection, unspecified: Secondary | ICD-10-CM

## 2012-01-25 MED ORDER — DOXYCYCLINE HYCLATE 100 MG PO TABS: 100.0000 mg | ORAL_TABLET | Freq: Two times a day (BID) | ORAL | Status: AC

## 2012-01-25 NOTE — Progress Notes (Signed)
  Subjective:    Patient ID: Daniel Richard, male    DOB: [birth year], 56 y.o.   MRN: [id number]  HPI Pt states 3 days of slight prod-quality cough in the chest, and assoc nasal congestion. Past Medical History  Diagnosis Date  . ALLERGIC RHINITIS 07/16/2007  . ANXIETY 07/16/2007  . LOW BACK PAIN 07/16/2007  . HYPERTENSION 07/16/2007  . HYPERLIPIDEMIA 12/24/2009    No past surgical history on file.  History   Social History  . Marital Status: Married    Spouse Name: N/A    Number of Children: N/A  . Years of Education: N/A   Occupational History  . Maintenance/painting  [employer]   Social History Main Topics  . Smoking status: Current Everyday Smoker  . Smokeless tobacco: Not on file  . Alcohol Use: Not on file  . Drug Use: No  . Sexually Active: Not on file   Other Topics Concern  . Not on file   Social History Narrative   Lives with wife and teenage daughter    No current outpatient prescriptions on file prior to visit.    Allergies  Allergen Reactions  . Meperidine Hcl     Family History  Problem Relation Age of Onset  . Hypertension Other     BP 148/90  Pulse 43  Temp(Src) 97.9 F (36.6 C) (Oral)  Wt 183 lb (83.008 kg)  SpO2 97%    Review of Systems Denies fever    Objective:   Physical Exam VITAL SIGNS:  See vs page GENERAL: no distress head: no deformity eyes: no periorbital swelling, no proptosis external nose and ears are normal mouth: pharynx is red Both tm' are red.       Assessment & Plan:  Glenford Peers, new HTN, needs f/u

## 2012-01-25 NOTE — Patient Instructions (Addendum)
Despite not having insurance, you should see dr Jonny Ruiz for your blood pressure and any other health problems. i have sent a prescription to your pharmacy, for an antibiotic. I hope you feel better soon.  If you don't feel better by next week, please call back.

## 2013-02-07 ENCOUNTER — Ambulatory Visit (INDEPENDENT_AMBULATORY_CARE_PROVIDER_SITE_OTHER): Payer: BC Managed Care – PPO | Admitting: Internal Medicine

## 2013-02-07 ENCOUNTER — Other Ambulatory Visit (INDEPENDENT_AMBULATORY_CARE_PROVIDER_SITE_OTHER): Payer: BC Managed Care – PPO

## 2013-02-07 ENCOUNTER — Encounter: Payer: Self-pay | Admitting: Internal Medicine

## 2013-02-07 VITALS — BP 168/92 | HR 80 | Temp 98.3°F

## 2013-02-07 DIAGNOSIS — F10239 Alcohol dependence with withdrawal, unspecified: Secondary | ICD-10-CM

## 2013-02-07 DIAGNOSIS — R252 Cramp and spasm: Secondary | ICD-10-CM

## 2013-02-07 DIAGNOSIS — I10 Essential (primary) hypertension: Secondary | ICD-10-CM

## 2013-02-07 DIAGNOSIS — F1010 Alcohol abuse, uncomplicated: Secondary | ICD-10-CM

## 2013-02-07 LAB — HEPATIC FUNCTION PANEL
ALT: 28 U/L (ref 0–53)
AST: 28 U/L (ref 0–37)
Albumin: 4.4 g/dL (ref 3.5–5.2)
Alkaline Phosphatase: 81 U/L (ref 39–117)
Bilirubin, Direct: 0.1 mg/dL (ref 0.0–0.3)
Total Bilirubin: 1 mg/dL (ref 0.3–1.2)
Total Protein: 8 g/dL (ref 6.0–8.3)

## 2013-02-07 LAB — TSH: TSH: 1.12 u[IU]/mL (ref 0.35–5.50)

## 2013-02-07 LAB — CBC WITH DIFFERENTIAL/PLATELET
Basophils Absolute: 0 10*3/uL (ref 0.0–0.1)
Basophils Relative: 0.4 % (ref 0.0–3.0)
Eosinophils Absolute: 0.1 10*3/uL (ref 0.0–0.7)
Eosinophils Relative: 0.6 % (ref 0.0–5.0)
HCT: 46.2 % (ref 39.0–52.0)
Hemoglobin: 15.7 g/dL (ref 13.0–17.0)
Lymphocytes Relative: 33.1 % (ref 12.0–46.0)
Lymphs Abs: 3.2 10*3/uL (ref 0.7–4.0)
MCHC: 34 g/dL (ref 30.0–36.0)
MCV: 96.4 fl (ref 78.0–100.0)
Monocytes Absolute: 0.9 10*3/uL (ref 0.1–1.0)
Monocytes Relative: 8.8 % (ref 3.0–12.0)
Neutro Abs: 5.6 10*3/uL (ref 1.4–7.7)
Neutrophils Relative %: 57.1 % (ref 43.0–77.0)
Platelets: 248 10*3/uL (ref 150.0–400.0)
RBC: 4.8 Mil/uL (ref 4.22–5.81)
RDW: 13.3 % (ref 11.5–14.6)
WBC: 9.8 10*3/uL (ref 4.5–10.5)

## 2013-02-07 LAB — URINALYSIS, ROUTINE W REFLEX MICROSCOPIC
Bilirubin Urine: NEGATIVE
Hgb urine dipstick: NEGATIVE
Ketones, ur: NEGATIVE
Leukocytes, UA: NEGATIVE
Nitrite: NEGATIVE
Specific Gravity, Urine: <=1.005 (ref 1.000–1.030)
Total Protein, Urine: NEGATIVE
Urine Glucose: NEGATIVE
Urobilinogen, UA: 0.2 (ref 0.0–1.0)
pH: 6.5 (ref 5.0–8.0)

## 2013-02-07 LAB — LIPID PANEL
Cholesterol: 232 mg/dL — ABNORMAL HIGH (ref 0–200)
HDL: 39.2 mg/dL (ref >39.00)
Total CHOL/HDL Ratio: 6
Triglycerides: 145 mg/dL (ref 0.0–149.0)
VLDL: 29 mg/dL (ref 0.0–40.0)

## 2013-02-07 LAB — LDL CHOLESTEROL, DIRECT: Direct LDL: 171.1 mg/dL

## 2013-02-07 LAB — BASIC METABOLIC PANEL
BUN: 11 mg/dL (ref 6–23)
CO2: 28 mEq/L (ref 19–32)
Calcium: 9.5 mg/dL (ref 8.4–10.5)
Chloride: 103 mEq/L (ref 96–112)
Creatinine, Ser: 0.8 mg/dL (ref 0.4–1.5)
GFR: 106.35 mL/min (ref >60.00)
Glucose, Bld: 91 mg/dL (ref 70–99)
Potassium: 4.4 mEq/L (ref 3.5–5.1)
Sodium: 139 mEq/L (ref 135–145)

## 2013-02-07 LAB — PROTIME-INR
INR: 1.1 ratio — ABNORMAL HIGH (ref 0.8–1.0)
Prothrombin Time: 12 s (ref 10.2–12.4)

## 2013-02-07 LAB — APTT: aPTT: 30.1 s — ABNORMAL HIGH (ref 21.7–28.8)

## 2013-02-07 LAB — MAGNESIUM: Magnesium: 1.7 mg/dL (ref 1.5–2.5)

## 2013-02-07 MED ORDER — DIAZEPAM 5 MG PO TABS: 5.0000 mg | ORAL_TABLET | Freq: Two times a day (BID) | ORAL | Status: DC | PRN

## 2013-02-07 MED ORDER — AMLODIPINE BESYLATE 5 MG PO TABS: 5.0000 mg | ORAL_TABLET | Freq: Every day | ORAL | Status: DC

## 2013-02-07 NOTE — Patient Instructions (Signed)
It was good to see you today. Test(s) ordered today. Your results will be released to MyChart (or called to you) after review, usually within 72hours after test completion. If any changes need to be made, you will be notified at that same time. Use Valium half to whole tablet every 12 hours as needed for withdrawal symptoms Started amlodipine once daily for blood pressure control Your prescription(s) have been submitted to your pharmacy. Please take as directed and contact our office if you believe you are having problem(s) with the medication(s). Please schedule followup in 2 weeks for additional review with Dr Jonny Ruiz, call sooner if problems.

## 2013-02-07 NOTE — Progress Notes (Signed)
Subjective:    Patient ID: Daniel Richard, male    DOB: 11/23/1956, 57 y.o.   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 ALLERGIC RHINITIS 07/16/2007  . ANXIETY 07/16/2007  . LOW BACK PAIN 07/16/2007  . HYPERTENSION 07/16/2007  . HYPERLIPIDEMIA 12/24/2009    Review of Systems  Constitutional: Positive for diaphoresis, appetite change and fatigue. Negative for fever and unexpected weight change.  HENT: Negative for nosebleeds and neck stiffness.   Respiratory: Negative for cough and shortness of breath.   Cardiovascular: Positive for palpitations. Negative for chest pain and leg swelling.  Gastrointestinal: Negative for vomiting, abdominal pain, diarrhea, constipation, blood in stool and abdominal distention.  Neurological: Positive for tremors. Negative for dizziness, seizures, facial asymmetry, speech difficulty, weakness and headaches.       Objective:   Physical Exam BP 168/92  Pulse 80  Temp(Src) 98.3 F (36.8 C) (Oral)  SpO2 95% Wt Readings from Last 3 Encounters:  01/25/12 183 lb (83.008 kg)  12/08/10 177 lb (80.287 kg)  10/21/10 175 lb 8 oz (79.606 kg)   Constitutional:  He appears mildly tremulous, but well-developed and well-nourished. No distress. Wife and daughter at side HEENT: no scleral icterus or conjunctivitis.  Neck: Normal range of motion. Neck supple. No JVD present. No  thyromegaly present.  Cardiovascular: Normal rate, regular rhythm and normal heart sounds.  No murmur heard. no BLE edema Pulmonary/Chest: Effort normal and breath sounds normal. No respiratory distress. no wheezes.  Abdominal: Soft. Bowel sounds are normal. Patient exhibits no distension. There is no tenderness.  Neurological: he is alert and oriented to person, place, and time. No cranial nerve deficit. Coordination normal.  Skin: Skin is warm and dry.  No erythema or ulceration.  Psychiatric: he has a hyperactive and mildly anxious mood and affect. behavior is easily distractible, but judgment and thought content appear normal.   Lab Results  Component Value Date   WBC 8.5 12/24/2009   HGB 14.6 12/24/2009   HCT 44.5 12/24/2009   PLT 254.0 12/24/2009   GLUCOSE 71 12/24/2009   CHOL 193 12/24/2009   TRIG 171* 12/24/2009   HDL 39* 12/24/2009   LDLDIRECT 169.7 04/02/2007   LDLCALC 120* 12/24/2009   ALT 26 12/24/2009   AST 28 12/24/2009   NA 141 12/24/2009   K 4.6 12/24/2009   CL 105 12/24/2009   CREATININE 0.67 12/24/2009   BUN 11 12/24/2009   CO2 19 12/24/2009   TSH 0.92 12/24/2009   PSA 0.43 12/24/2009        Assessment & Plan:   EtOH WD - no evidence for DTs - >5 days since last drink - check labs and rx valium prn  HTN - suspect exacerbation by above - suspect prior hx same (hypertension) though patient denies same - start amlodipine 5mg  qd -followup 2 weeks, sooner if problems  Check coags, CBC, BMet, Mg and LFTs given history of heavy alcohol abuse and recent cessation  rtc 2 weeks for review, sooner if worse

## 2013-02-11 LAB — LEAD, BLOOD: Lead-Whole Blood: <1 ug/dL (ref <25.0)

## 2013-02-21 ENCOUNTER — Encounter: Payer: Self-pay | Admitting: Internal Medicine

## 2013-02-21 ENCOUNTER — Ambulatory Visit (INDEPENDENT_AMBULATORY_CARE_PROVIDER_SITE_OTHER): Payer: BC Managed Care – PPO | Admitting: Internal Medicine

## 2013-02-21 VITALS — BP 120/80 | HR 105 | Temp 97.6°F | Ht 72.0 in | Wt 184.1 lb

## 2013-02-21 DIAGNOSIS — Z0001 Encounter for general adult medical examination with abnormal findings: Secondary | ICD-10-CM | POA: Insufficient documentation

## 2013-02-21 DIAGNOSIS — Z0000 Encounter for general adult medical examination without abnormal findings: Secondary | ICD-10-CM

## 2013-02-21 DIAGNOSIS — J019 Acute sinusitis, unspecified: Secondary | ICD-10-CM

## 2013-02-21 DIAGNOSIS — E785 Hyperlipidemia, unspecified: Secondary | ICD-10-CM

## 2013-02-21 MED ORDER — AMLODIPINE BESYLATE 5 MG PO TABS: 5.0000 mg | ORAL_TABLET | Freq: Every day | ORAL | Status: DC

## 2013-02-21 MED ORDER — ASPIRIN 81 MG PO TBEC: 81.0000 mg | DELAYED_RELEASE_TABLET | Freq: Every day | ORAL | Status: DC

## 2013-02-21 MED ORDER — AZITHROMYCIN 250 MG PO TABS: ORAL_TABLET | ORAL | Status: DC

## 2013-02-21 NOTE — Patient Instructions (Addendum)
Please take all new medication as prescribed  - the generic lipitor at 20 mg per day, and the antibiotic Please continue all other medications as before, and refills have been done for the amlodipine Please also start the Aspirin 81 mg - 1 per day - Coated only Please have the pharmacy call with any other refills you may need. Please call or let us know by Mychart if you change your mind about the having the screening colonoscopy Please go to the LAB in the Basement IN 4 WEEKS   - (turn left off the elevator) for the tests to be done (the PSA, cholesterol and liver tests) You will be contacted by phone if any changes need to be made immediately.  Otherwise, you will receive a letter about your results with an explanation, but please check with MyChart first. Thank you for enrolling in MyChart. Please follow the instructions below to securely access your online medical record. MyChart allows you to send messages to your doctor, view your test results, renew your prescriptions, schedule appointments, and more To Log into My Chart online, please go by [facility] or [employer] to [email or web address], or download the MyChart App from the [employer] .  Your Username is: [email or web address], pass chelsea Please send a practice Message on Mychart later today. Please return in 1 year for your yearly visit, or sooner if needed, with Lab testing done 3-5 days before

## 2013-02-21 NOTE — Assessment & Plan Note (Signed)
Overall doing well, age appropriate education and counseling updated, referrals for preventative services and immunizations addressed, dietary and smoking counseling addressed, most recent labs reviewed.  I have personally reviewed and have noted: 1) the patient's medical and social history 2) The pt's use of alcohol, tobacco, and illicit drugs 3) The patient's current medications and supplements 4) Functional ability including ADL's, fall risk, home safety risk, hearing and visual impairment 5) Diet and physical activities 6) Evidence for depression or mood disorder 7) The patient's height, weight, and BMI have been recorded in the chart I have made referrals, and provided counseling and education based on review of the above Recent labs reviewwed with pt, will need psa with next draw Lab Results  Component Value Date   WBC 9.8 02/07/2013   HGB 15.7 02/07/2013   HCT 46.2 02/07/2013   PLT 248.0 02/07/2013   GLUCOSE 91 02/07/2013   CHOL 232* 02/07/2013   TRIG 145.0 02/07/2013   HDL 39.20 02/07/2013   LDLDIRECT 171.1 02/07/2013   LDLCALC 120* 12/24/2009   ALT 28 02/07/2013   AST 28 02/07/2013   NA 139 02/07/2013   K 4.4 02/07/2013   CL 103 02/07/2013   CREATININE 0.8 02/07/2013   BUN 11 02/07/2013   CO2 28 02/07/2013   TSH 1.12 02/07/2013   PSA 0.43 12/24/2009   INR 1.1* 02/07/2013

## 2013-02-21 NOTE — Progress Notes (Signed)
Subjective:    Patient ID: Daniel Richard, male    DOB: 08/28/1956, 57 y.o.   MRN: [id number]  HPI  Here for wellness and f/u;  Overall doing ok;  Pt denies CP, worsening SOB, DOE, wheezing, orthopnea, PND, worsening LE edema, palpitations, dizziness or syncope.  Pt denies neurological change such as new headache, facial or extremity weakness.  Pt denies polydipsia, polyuria, or low sugar symptoms. Pt states overall good compliance with treatment and medications, good tolerability, and has been trying to follow lower cholesterol diet.  Pt denies worsening depressive symptoms, suicidal ideation or panic. No fever, night sweats, wt loss, loss of appetite, or other constitutional symptoms.  Pt states good ability with ADL's, has low fall risk, home safety reviewed and adequate, no other significant changes in hearing or vision, and only occasionally active with exercise.  Has been without health ins for several yrs, now to work with benefits.  Incidentally -  Here with 2-3 days acute onset fever, facial pain, pressure, headache, general weakness and malaise, and greenish d/c, with mild ST and cough. Past Medical History  Diagnosis Date  . ALLERGIC RHINITIS 07/16/2007  . ANXIETY 07/16/2007  . LOW BACK PAIN 07/16/2007  . HYPERTENSION 07/16/2007  . HYPERLIPIDEMIA 12/24/2009   No past surgical history on file.  reports that he has been smoking.  He does not have any smokeless tobacco history on file. He reports that he does not use illicit drugs. His alcohol history is not on file. family history includes Hypertension in his other. Allergies  Allergen Reactions  . Meperidine Hcl    Current Outpatient Prescriptions on File Prior to Visit  Medication Sig Dispense Refill  . B Complex-C (SUPER B COMPLEX) TABS Take 1 tablet by mouth daily.      . diazepam (VALIUM) 5 MG tablet Take 1 tablet (5 mg total) by mouth every 12 (twelve) hours as needed for anxiety.  30 tablet  0  . Magnesium 250 MG TABS Take 1 tablet  by mouth daily.      . niacin 500 MG tablet Take 500 mg by mouth daily.      . vitamin C (ASCORBIC ACID) 500 MG tablet Take 500 mg by mouth daily.       No current facility-administered medications on file prior to visit.   Review of Systems Constitutional: Negative for diaphoresis, activity change, appetite change or unexpected weight change.  HENT: Negative for hearing loss, ear pain, facial swelling, mouth sores and neck stiffness.   Eyes: Negative for pain, redness and visual disturbance.  Respiratory: Negative for shortness of breath and wheezing.   Cardiovascular: Negative for chest pain and palpitations.  Gastrointestinal: Negative for diarrhea, blood in stool, abdominal distention or other pain Genitourinary: Negative for hematuria, flank pain or change in urine volume.  Musculoskeletal: Negative for myalgias and joint swelling.  Skin: Negative for color change and wound.  Neurological: Negative for syncope and numbness. other than noted Hematological: Negative for adenopathy.  Psychiatric/Behavioral: Negative for hallucinations, self-injury, decreased concentration and agitation.      Objective:   Physical Exam BP 120/80  Pulse 105  Temp(Src) 97.6 F (36.4 C) (Oral)  Ht 6' (1.829 m)  Wt 184 lb 2 oz (83.519 kg)  BMI 24.97 kg/m2  SpO2 96% VS noted, mild ill Constitutional: Pt is oriented to person, place, and time. Appears well-developed and well-nourished.  Head: Normocephalic and atraumatic.  Right Ear: External ear normal.  Left Ear: External ear normal.  Nose: Nose  normal.  Mouth/Throat: Oropharynx is moist.  Bilat tm's with mild erythema.  Max sinus areas mild tender.  Pharynx with mild erythema, no exudate Eyes: Conjunctivae and EOM are normal. Pupils are equal, round, and reactive to light.  Neck: Normal range of motion. Neck supple. No JVD present. No tracheal deviation present.  Cardiovascular: Normal rate, regular rhythm, normal heart sounds and intact distal  pulses.   Pulmonary/Chest: Effort normal and breath sounds normal.  Abdominal: Soft. Bowel sounds are normal. There is no tenderness. No HSM  Musculoskeletal: Normal range of motion. Exhibits no edema.  Lymphadenopathy:  Has no cervical adenopathy.  Neurological: Pt is alert and oriented to person, place, and time. Pt has normal reflexes. No cranial nerve deficit.  Skin: Skin is warm and dry. No rash noted.  Psychiatric:  Has  normal mood and affect.except mild nervous Behavior is normal.     Assessment & Plan:

## 2013-02-21 NOTE — Assessment & Plan Note (Signed)
Mild to mod, for antibx course,  to f/u any worsening symptoms or concerns 

## 2013-02-21 NOTE — Assessment & Plan Note (Signed)
Last ldl 171 two weeks ago; to start lower chol diet, and lipitor 20, f/u labs 4 wks

## 2013-02-24 ENCOUNTER — Other Ambulatory Visit: Payer: Self-pay

## 2013-02-24 MED ORDER — ATORVASTATIN CALCIUM 20 MG PO TABS: 20.0000 mg | ORAL_TABLET | Freq: Every day | ORAL | Status: DC

## 2013-03-24 ENCOUNTER — Other Ambulatory Visit (INDEPENDENT_AMBULATORY_CARE_PROVIDER_SITE_OTHER): Payer: BC Managed Care – PPO

## 2013-03-24 DIAGNOSIS — Z0000 Encounter for general adult medical examination without abnormal findings: Secondary | ICD-10-CM

## 2013-03-24 LAB — HEPATIC FUNCTION PANEL
ALT: 23 U/L (ref 0–53)
AST: 25 U/L (ref 0–37)
Albumin: 4.1 g/dL (ref 3.5–5.2)
Alkaline Phosphatase: 84 U/L (ref 39–117)
Bilirubin, Direct: 0.2 mg/dL (ref 0.0–0.3)
Total Bilirubin: 0.7 mg/dL (ref 0.3–1.2)
Total Protein: 7.1 g/dL (ref 6.0–8.3)

## 2013-03-24 LAB — LIPID PANEL
Cholesterol: 167 mg/dL (ref 0–200)
HDL: 38.5 mg/dL — ABNORMAL LOW (ref >39.00)
LDL Cholesterol: 108 mg/dL — ABNORMAL HIGH (ref 0–99)
Total CHOL/HDL Ratio: 4
Triglycerides: 103 mg/dL (ref 0.0–149.0)
VLDL: 20.6 mg/dL (ref 0.0–40.0)

## 2013-03-24 LAB — PSA: PSA: 0.32 ng/mL (ref 0.10–4.00)

## 2013-04-22 ENCOUNTER — Other Ambulatory Visit: Payer: Self-pay | Admitting: Internal Medicine

## 2013-04-22 ENCOUNTER — Other Ambulatory Visit: Payer: Self-pay

## 2013-04-22 MED ORDER — DIAZEPAM 5 MG PO TABS: 5.0000 mg | ORAL_TABLET | Freq: Two times a day (BID) | ORAL | Status: DC | PRN

## 2013-04-22 NOTE — Telephone Encounter (Signed)
Patient informed prescription has been sent in.

## 2013-04-22 NOTE — Telephone Encounter (Signed)
Rx faxed to [facility].  

## 2013-04-22 NOTE — Telephone Encounter (Signed)
Valium rx already done may 13

## 2013-07-13 ENCOUNTER — Other Ambulatory Visit: Payer: Self-pay | Admitting: Internal Medicine

## 2013-07-14 MED ORDER — DIAZEPAM 5 MG PO TABS: 5.0000 mg | ORAL_TABLET | Freq: Two times a day (BID) | ORAL | Status: DC | PRN

## 2013-08-29 ENCOUNTER — Other Ambulatory Visit (INDEPENDENT_AMBULATORY_CARE_PROVIDER_SITE_OTHER): Payer: BC Managed Care – PPO

## 2013-08-29 ENCOUNTER — Ambulatory Visit (INDEPENDENT_AMBULATORY_CARE_PROVIDER_SITE_OTHER): Payer: BC Managed Care – PPO | Admitting: Internal Medicine

## 2013-08-29 ENCOUNTER — Encounter: Payer: Self-pay | Admitting: Internal Medicine

## 2013-08-29 VITALS — BP 150/102 | HR 72 | Temp 97.6°F | Ht 72.0 in | Wt 181.0 lb

## 2013-08-29 DIAGNOSIS — R5381 Other malaise: Secondary | ICD-10-CM | POA: Insufficient documentation

## 2013-08-29 DIAGNOSIS — M791 Myalgia, unspecified site: Secondary | ICD-10-CM

## 2013-08-29 DIAGNOSIS — F411 Generalized anxiety disorder: Secondary | ICD-10-CM

## 2013-08-29 DIAGNOSIS — I10 Essential (primary) hypertension: Secondary | ICD-10-CM

## 2013-08-29 DIAGNOSIS — R110 Nausea: Secondary | ICD-10-CM | POA: Insufficient documentation

## 2013-08-29 DIAGNOSIS — R42 Dizziness and giddiness: Secondary | ICD-10-CM

## 2013-08-29 DIAGNOSIS — IMO0001 Reserved for inherently not codable concepts without codable children: Secondary | ICD-10-CM

## 2013-08-29 DIAGNOSIS — J309 Allergic rhinitis, unspecified: Secondary | ICD-10-CM

## 2013-08-29 LAB — CK: Total CK: 81 U/L (ref 7–232)

## 2013-08-29 LAB — CBC WITH DIFFERENTIAL/PLATELET
Basophils Absolute: 0 10*3/uL (ref 0.0–0.1)
Basophils Relative: 0.3 % (ref 0.0–3.0)
Eosinophils Absolute: 0 10*3/uL (ref 0.0–0.7)
Eosinophils Relative: 0.3 % (ref 0.0–5.0)
HCT: 43.4 % (ref 39.0–52.0)
Hemoglobin: 14.8 g/dL (ref 13.0–17.0)
Lymphocytes Relative: 25.6 % (ref 12.0–46.0)
Lymphs Abs: 2.6 10*3/uL (ref 0.7–4.0)
MCHC: 34.2 g/dL (ref 30.0–36.0)
MCV: 92.9 fl (ref 78.0–100.0)
Monocytes Absolute: 0.8 10*3/uL (ref 0.1–1.0)
Monocytes Relative: 8.3 % (ref 3.0–12.0)
Neutro Abs: 6.7 10*3/uL (ref 1.4–7.7)
Neutrophils Relative %: 65.5 % (ref 43.0–77.0)
Platelets: 297 10*3/uL (ref 150.0–400.0)
RBC: 4.67 Mil/uL (ref 4.22–5.81)
RDW: 14 % (ref 11.5–14.6)
WBC: 10.2 10*3/uL (ref 4.5–10.5)

## 2013-08-29 LAB — BASIC METABOLIC PANEL
BUN: 11 mg/dL (ref 6–23)
CO2: 27 mEq/L (ref 19–32)
Calcium: 9.4 mg/dL (ref 8.4–10.5)
Chloride: 103 mEq/L (ref 96–112)
Creatinine, Ser: 0.8 mg/dL (ref 0.4–1.5)
GFR: 106.14 mL/min (ref >60.00)
Glucose, Bld: 98 mg/dL (ref 70–99)
Potassium: 4.5 mEq/L (ref 3.5–5.1)
Sodium: 138 mEq/L (ref 135–145)

## 2013-08-29 LAB — HEPATIC FUNCTION PANEL
ALT: 23 U/L (ref 0–53)
AST: 24 U/L (ref 0–37)
Albumin: 4.5 g/dL (ref 3.5–5.2)
Alkaline Phosphatase: 87 U/L (ref 39–117)
Bilirubin, Direct: 0.1 mg/dL (ref 0.0–0.3)
Total Bilirubin: 0.7 mg/dL (ref 0.3–1.2)
Total Protein: 7.9 g/dL (ref 6.0–8.3)

## 2013-08-29 LAB — SEDIMENTATION RATE: Sed Rate: 22 mm/hr (ref 0–22)

## 2013-08-29 MED ORDER — METHYLPREDNISOLONE ACETATE 80 MG/ML IJ SUSP
80.0000 mg | Freq: Once | INTRAMUSCULAR | Status: AC
  Administered 2013-08-29: 80 mg via INTRAMUSCULAR

## 2013-08-29 MED ORDER — FLUTICASONE PROPIONATE 50 MCG/ACT NA SUSP: 2.0000 | Freq: Every day | NASAL | Status: DC

## 2013-08-29 MED ORDER — FEXOFENADINE HCL 180 MG PO TABS: 180.0000 mg | ORAL_TABLET | Freq: Every day | ORAL | Status: DC

## 2013-08-29 MED ORDER — PREDNISONE 10 MG PO TABS: ORAL_TABLET | ORAL | Status: DC

## 2013-08-29 NOTE — Assessment & Plan Note (Signed)
Likely due to lipitor, not related likely to other allergy or related symtpoms today, for CPK, o/w ok to stop lipitor for at least one month

## 2013-08-29 NOTE — Assessment & Plan Note (Addendum)
liekly secondary today, for labs, o/w  to f/u any worsening symptoms or concerns  Note:  Total time for pt hx, exam, review of record with pt in the room, determination of diagnoses and plan for further eval and tx is > 40 min, with over 50% spent in coordination and counseling of patient

## 2013-08-29 NOTE — Assessment & Plan Note (Signed)
Mild elev today, likely sitautional, ok to cont meds as is, pt will check BP at home and let us know if persistent elev or worse in 1-2 wks or sooner if needed

## 2013-08-29 NOTE — Assessment & Plan Note (Signed)
Etiology unclear, Exam otherwise benign, to check labs as documented, follow with expectant management  

## 2013-08-29 NOTE — Assessment & Plan Note (Signed)
Situational worsening, reassured today, for cont same meds,  to f/u any worsening symptoms or concerns

## 2013-08-29 NOTE — Progress Notes (Signed)
  Subjective:    Patient ID: Daniel Richard, male    DOB: 1956/02/13, 57 y.o.   MRN: [id number]  HPI  Here to f/u, states has really been trying to take lipitor, and has been compliant but now just can no longer take the lipitor if seems to be causing worsening myalgias and joint aches,has a physical job, also with worsening fatigue , low energy, gets drowsy sometimes during the day and wonders if on too much BP med as he has been looking at the side effect profiles from the information given at the pharmacy.  Admits to increased anxiety today, worse lately for unclear reason, maybe the pain seems to make it worse.  No worsening depression or SI.  Gets nausea nearly daily for 3 wks with feeling off balance, no hx of allergy tx but has several sinus pressure, ear fullness recently, and off and on allergy congestion, not tried any OTC meds.  Trying to not take valium unless really needed. Urged by boss at work to come today Past Medical History  Diagnosis Date  . ALLERGIC RHINITIS 07/16/2007  . ANXIETY 07/16/2007  . LOW BACK PAIN 07/16/2007  . HYPERTENSION 07/16/2007  . HYPERLIPIDEMIA 12/24/2009   No past surgical history on file.  reports that he has been smoking.  He does not have any smokeless tobacco history on file. He reports that he does not use illicit drugs. His alcohol history is not on file. family history includes Hypertension in his other. Allergies  Allergen Reactions  . Meperidine Hcl    Review of Systems  Constitutional: Negative for unexpected weight change, or unusual diaphoresis  HENT: Negative for tinnitus.   Eyes: Negative for photophobia and visual disturbance.  Respiratory: Negative for choking and stridor.   Gastrointestinal: Negative for vomiting and blood in stool.  Genitourinary: Negative for hematuria and decreased urine volume.  Musculoskeletal: Negative for acute joint swelling Skin: Negative for color change and wound.  Neurological: Negative for tremors and numbness  other than noted  Psychiatric/Behavioral: Negative for decreased concentration or  hyperactivity.       Objective:   Physical Exam BP 150/102  Pulse 72  Temp(Src) 97.6 F (36.4 C) (Oral)  Ht 6' (1.829 m)  Wt 181 lb (82.101 kg)  BMI 24.54 kg/m2  SpO2 97% VS noted, markedly nervous Constitutional: Pt appears well-developed and well-nourished.  HENT: Head: NCAT.  Right Ear: External ear normal.  Left Ear: External ear normal.  Bilat tm's with mod to severe erythema.  Max sinus areas non tender.  Pharynx with mild erythema, no exudate Eyes: Conjunctivae and EOM are normal. Pupils are equal, round, and reactive to light.  Neck: Normal range of motion. Neck supple.  Cardiovascular: Normal rate and regular rhythm.   Pulmonary/Chest: Effort normal and breath sounds normal.  Abd:  Soft, NT, non-distended, + BS Neurological: Pt is alert. Not confused , motor 5/5, sens/dtr intact Skin: Skin is warm. No erythema.  Psychiatric: Pt behavior is normal. Thought content normal. 2+ nervous    Assessment & Plan:

## 2013-08-29 NOTE — Patient Instructions (Signed)
You had the steroid shot today Please take all new medication as prescribed - the prednisone, allegra, and flonase as prescribed OK to stop the Lipitor for now for at least 1 month Please consider a different statin such as lovastatin if all the symtpoms resolve and the labs prove ok Please continue all other medications as before Please check your Blood Pressure daily for 2 wks, and call if remains elevated Please go to the LAB in the Basement (turn left off the elevator) for the tests to be done today You will be contacted by phone if any changes need to be made immediately.  Otherwise, you will receive a letter about your results with an explanation, but please check with MyChart first.  You are given the work note for today

## 2013-08-29 NOTE — Assessment & Plan Note (Signed)
Mod flare with pressure, dizziness and nausea likely associtated, for depomedrol IM, predpack short course, allegra/flonase,  to f/u any worsening symptoms or concerns

## 2013-08-29 NOTE — Assessment & Plan Note (Signed)
Likely seconary today, for labs,  to f/u any worsening symptoms or concerns

## 2013-09-25 ENCOUNTER — Other Ambulatory Visit: Payer: Self-pay

## 2013-09-25 MED ORDER — DIAZEPAM 5 MG PO TABS: 5.0000 mg | ORAL_TABLET | Freq: Two times a day (BID) | ORAL | Status: DC | PRN

## 2013-09-25 NOTE — Telephone Encounter (Signed)
Faxed hardcopy to [facility] [place]  

## 2013-09-25 NOTE — Telephone Encounter (Signed)
Done hardcopy to [facility]bin  

## 2013-10-16 ENCOUNTER — Other Ambulatory Visit: Payer: Self-pay

## 2013-10-24 ENCOUNTER — Other Ambulatory Visit: Payer: Self-pay | Admitting: Internal Medicine

## 2013-10-24 ENCOUNTER — Other Ambulatory Visit (INDEPENDENT_AMBULATORY_CARE_PROVIDER_SITE_OTHER): Payer: BC Managed Care – PPO

## 2013-10-24 ENCOUNTER — Ambulatory Visit (INDEPENDENT_AMBULATORY_CARE_PROVIDER_SITE_OTHER): Payer: BC Managed Care – PPO | Admitting: Internal Medicine

## 2013-10-24 ENCOUNTER — Encounter: Payer: Self-pay | Admitting: Internal Medicine

## 2013-10-24 VITALS — BP 152/100 | HR 91 | Temp 98.1°F | Ht 72.0 in | Wt 178.4 lb

## 2013-10-24 DIAGNOSIS — M545 Low back pain, unspecified: Secondary | ICD-10-CM

## 2013-10-24 DIAGNOSIS — J309 Allergic rhinitis, unspecified: Secondary | ICD-10-CM

## 2013-10-24 DIAGNOSIS — I10 Essential (primary) hypertension: Secondary | ICD-10-CM

## 2013-10-24 DIAGNOSIS — E785 Hyperlipidemia, unspecified: Secondary | ICD-10-CM

## 2013-10-24 DIAGNOSIS — J019 Acute sinusitis, unspecified: Secondary | ICD-10-CM

## 2013-10-24 LAB — LIPID PANEL
Cholesterol: 253 mg/dL — ABNORMAL HIGH (ref 0–200)
HDL: 44.1 mg/dL (ref >39.00)
Total CHOL/HDL Ratio: 6
Triglycerides: 125 mg/dL (ref 0.0–149.0)
VLDL: 25 mg/dL (ref 0.0–40.0)

## 2013-10-24 LAB — LDL CHOLESTEROL, DIRECT: Direct LDL: 195.1 mg/dL

## 2013-10-24 MED ORDER — TIZANIDINE HCL 4 MG PO TABS: 4.0000 mg | ORAL_TABLET | Freq: Four times a day (QID) | ORAL | Status: DC | PRN

## 2013-10-24 MED ORDER — FLUTICASONE PROPIONATE 50 MCG/ACT NA SUSP: 2.0000 | Freq: Every day | NASAL | Status: DC

## 2013-10-24 MED ORDER — FEXOFENADINE HCL 180 MG PO TABS: 180.0000 mg | ORAL_TABLET | Freq: Every day | ORAL | Status: DC

## 2013-10-24 MED ORDER — SIMVASTATIN 20 MG PO TABS: 20.0000 mg | ORAL_TABLET | Freq: Every day | ORAL | Status: DC

## 2013-10-24 MED ORDER — LEVOFLOXACIN 250 MG PO TABS: 250.0000 mg | ORAL_TABLET | Freq: Every day | ORAL | Status: DC

## 2013-10-24 NOTE — Patient Instructions (Addendum)
OK to change the amlodipine to 2.5 mg per day (a new prescription is sent to the pharmacy) Please take all new medication as prescribed  - the antibiotic, and the muscle relaxer Please continue all other medications as before, including the allergy meds You can also take Delsym OTC for cough, and/or Mucinex (or it's generic off brand) for congestion, and tylenol as needed for pain. Please have the pharmacy call with any other refills you may need.  Please go to the LAB in the Basement (turn left off the elevator) for the tests to be done today You will be contacted by phone if any changes need to be made immediately.  Otherwise, you will receive a letter about your results with an explanation, but please check with MyChart first.

## 2013-10-24 NOTE — Progress Notes (Signed)
  Subjective:    Patient ID: Daniel Richard, male    DOB: 1956/02/11, 57 y.o.   MRN: [id number]  HPI   Here with 2-3 days acute onset fever, facial pain, pressure, headache, general weakness and malaise, and greenish d/c, with mild ST and cough, but pt denies chest pain, wheezing, increased sob or doe, orthopnea, PND, increased LE swelling, palpitations, dizziness or syncope. Does have several wks ongoing nasal allergy symptoms with clearish congestion, itch and sneezing, without fever, pain, ST, cough, swelling or wheezing.   BP improved, only taking 1/2 the 5 mg amlodipine. Did not tolerated Lipitor due to myalgias, asks for lipid f/u after trying to follow better diet.  Pt continues to have recurring left LBP without change in severity, bowel or bladder change, fever, wt loss,  worsening LE pain/numbness/weakness, gait change or falls. Past Medical History  Diagnosis Date  . ALLERGIC RHINITIS 07/16/2007  . ANXIETY 07/16/2007  . LOW BACK PAIN 07/16/2007  . HYPERTENSION 07/16/2007  . HYPERLIPIDEMIA 12/24/2009   No past surgical history on file.  reports that he has been smoking.  He does not have any smokeless tobacco history on file. He reports that he does not use illicit drugs. His alcohol history is not on file. family history includes Hypertension in his other. Allergies  Allergen Reactions  . Lipitor [Atorvastatin]     myalgia  . Meperidine Hcl    Current Outpatient Prescriptions on File Prior to Visit  Medication Sig Dispense Refill  . aspirin 81 MG EC tablet Take 1 tablet (81 mg total) by mouth daily. Swallow whole.  30 tablet  12  . diazepam (VALIUM) 5 MG tablet Take 1 tablet (5 mg total) by mouth every 12 (twelve) hours as needed for anxiety.  30 tablet  1   No current facility-administered medications on file prior to visit.   Review of Systems  Constitutional: Negative for unexpected weight change, or unusual diaphoresis  HENT: Negative for tinnitus.   Eyes: Negative for  photophobia and visual disturbance.  Respiratory: Negative for choking and stridor.   Gastrointestinal: Negative for vomiting and blood in stool.  Genitourinary: Negative for hematuria and decreased urine volume.  Musculoskeletal: Negative for acute joint swelling Skin: Negative for color change and wound.  Neurological: Negative for tremors and numbness other than noted  Psychiatric/Behavioral: Negative for decreased concentration or  hyperactivity.       Objective:   Physical Exam BP 152/100  Pulse 91  Temp(Src) 98.1 F (36.7 C) (Oral)  Ht 6' (1.829 m)  Wt 178 lb 6 oz (80.91 kg)  BMI 24.19 kg/m2  SpO2 97% VS noted,  Constitutional: Pt appears well-developed and well-nourished.  HENT: Head: NCAT.  Right Ear: External ear normal.  Left Ear: External ear normal.  Bilat tm's with mild erythema.  Max sinus areas mild tender.  Pharynx with mild erythema, no exudate Eyes: Conjunctivae and EOM are normal. Pupils are equal, round, and reactive to light.  Neck: Normal range of motion. Neck supple.  Cardiovascular: Normal rate and regular rhythm.   Pulmonary/Chest: Effort normal and breath sounds normal.  Abd:  Soft, NT, non-distended, + BS Neurological: Pt is alert. Not confused , motor 5/5 Skin: Skin is warm. No erythema.  Psychiatric: Pt behavior is normal. Thought content normal.  Spine nontender    Assessment & Plan:

## 2013-10-26 NOTE — Assessment & Plan Note (Signed)
elev today, ? Situational, has had BP < 140/90 per pt at home, stable overall by history and exam, recent data reviewed with pt, and pt to continue medical treatment as before,  to f/u any worsening symptoms or concerns BP Readings from Last 3 Encounters:  10/24/13 152/100  08/29/13 150/102  02/21/13 120/80

## 2013-10-26 NOTE — Assessment & Plan Note (Signed)
For tizanidine prn, to f/u any worsening symptoms or concerns

## 2013-10-26 NOTE — Assessment & Plan Note (Signed)
For fu lipid, cont diet

## 2013-10-26 NOTE — Assessment & Plan Note (Signed)
For med restart,  to f/u any worsening symptoms or concerns  

## 2013-10-26 NOTE — Assessment & Plan Note (Addendum)
Mild to mod, for antibx course,  to f/u any worsening symptoms or concerns  Note:  Total time for pt hx, exam, review of record with pt in the room, determination of diagnoses and plan for further eval and tx is > 40 min, with over 50% spent in coordination and counseling of patient 

## 2013-10-29 ENCOUNTER — Other Ambulatory Visit: Payer: Self-pay | Admitting: *Deleted

## 2013-10-29 MED ORDER — AMLODIPINE BESYLATE 2.5 MG PO TABS: 2.5000 mg | ORAL_TABLET | Freq: Every day | ORAL | Status: DC

## 2013-11-19 ENCOUNTER — Telehealth: Payer: Self-pay | Admitting: *Deleted

## 2013-11-19 DIAGNOSIS — E785 Hyperlipidemia, unspecified: Secondary | ICD-10-CM

## 2013-11-19 NOTE — Telephone Encounter (Signed)
Pt called requesting to come in next week to have his Cholesterol checked due to medication change 11.2014.  Please advise

## 2013-11-19 NOTE — Telephone Encounter (Signed)
Orders in emr

## 2013-11-19 NOTE — Telephone Encounter (Signed)
Patient informed. 

## 2013-11-24 ENCOUNTER — Other Ambulatory Visit (INDEPENDENT_AMBULATORY_CARE_PROVIDER_SITE_OTHER): Payer: BC Managed Care – PPO

## 2013-11-24 DIAGNOSIS — Z0000 Encounter for general adult medical examination without abnormal findings: Secondary | ICD-10-CM

## 2013-11-24 DIAGNOSIS — E785 Hyperlipidemia, unspecified: Secondary | ICD-10-CM

## 2013-11-24 LAB — URINALYSIS, ROUTINE W REFLEX MICROSCOPIC
Bilirubin Urine: NEGATIVE
Hgb urine dipstick: NEGATIVE
Ketones, ur: NEGATIVE
Leukocytes, UA: NEGATIVE
Nitrite: NEGATIVE
RBC / HPF: NONE SEEN (ref 0–?)
Specific Gravity, Urine: <=1.005 (ref 1.000–1.030)
Total Protein, Urine: NEGATIVE
Urine Glucose: NEGATIVE
Urobilinogen, UA: 0.2 (ref 0.0–1.0)
pH: 6 (ref 5.0–8.0)

## 2013-11-24 LAB — CBC WITH DIFFERENTIAL/PLATELET
Basophils Absolute: 0 10*3/uL (ref 0.0–0.1)
Basophils Relative: 0.4 % (ref 0.0–3.0)
Eosinophils Absolute: 0.1 10*3/uL (ref 0.0–0.7)
Eosinophils Relative: 0.9 % (ref 0.0–5.0)
HCT: 44.5 % (ref 39.0–52.0)
Hemoglobin: 15 g/dL (ref 13.0–17.0)
Lymphocytes Relative: 37.1 % (ref 12.0–46.0)
Lymphs Abs: 3.6 10*3/uL (ref 0.7–4.0)
MCHC: 33.7 g/dL (ref 30.0–36.0)
MCV: 93.1 fl (ref 78.0–100.0)
Monocytes Absolute: 0.9 10*3/uL (ref 0.1–1.0)
Monocytes Relative: 9.4 % (ref 3.0–12.0)
Neutro Abs: 5 10*3/uL (ref 1.4–7.7)
Neutrophils Relative %: 52.2 % (ref 43.0–77.0)
Platelets: 276 10*3/uL (ref 150.0–400.0)
RBC: 4.78 Mil/uL (ref 4.22–5.81)
RDW: 13.4 % (ref 11.5–14.6)
WBC: 9.6 10*3/uL (ref 4.5–10.5)

## 2013-11-24 LAB — BASIC METABOLIC PANEL
BUN: 12 mg/dL (ref 6–23)
CO2: 30 mEq/L (ref 19–32)
Calcium: 9.7 mg/dL (ref 8.4–10.5)
Chloride: 102 mEq/L (ref 96–112)
Creatinine, Ser: 0.7 mg/dL (ref 0.4–1.5)
GFR: 116.03 mL/min (ref >60.00)
Glucose, Bld: 83 mg/dL (ref 70–99)
Potassium: 5.4 mEq/L — ABNORMAL HIGH (ref 3.5–5.1)
Sodium: 139 mEq/L (ref 135–145)

## 2013-11-24 LAB — LIPID PANEL
Cholesterol: 196 mg/dL (ref 0–200)
HDL: 40 mg/dL (ref >39.00)
LDL Cholesterol: 116 mg/dL — ABNORMAL HIGH (ref 0–99)
Total CHOL/HDL Ratio: 5
Triglycerides: 199 mg/dL — ABNORMAL HIGH (ref 0.0–149.0)
VLDL: 39.8 mg/dL (ref 0.0–40.0)

## 2013-11-24 LAB — HEPATIC FUNCTION PANEL
ALT: 27 U/L (ref 0–53)
AST: 27 U/L (ref 0–37)
Albumin: 4.6 g/dL (ref 3.5–5.2)
Alkaline Phosphatase: 90 U/L (ref 39–117)
Bilirubin, Direct: 0.1 mg/dL (ref 0.0–0.3)
Total Bilirubin: 0.6 mg/dL (ref 0.3–1.2)
Total Protein: 7.6 g/dL (ref 6.0–8.3)

## 2013-11-24 LAB — TSH: TSH: 1.8 u[IU]/mL (ref 0.35–5.50)

## 2013-11-24 LAB — PSA: PSA: 0.5 ng/mL (ref 0.10–4.00)

## 2014-01-30 ENCOUNTER — Ambulatory Visit (INDEPENDENT_AMBULATORY_CARE_PROVIDER_SITE_OTHER): Payer: BC Managed Care – PPO | Admitting: Internal Medicine

## 2014-01-30 ENCOUNTER — Encounter: Payer: Self-pay | Admitting: Internal Medicine

## 2014-01-30 VITALS — BP 122/80 | HR 92 | Temp 97.8°F | Ht 72.0 in | Wt 179.1 lb

## 2014-01-30 DIAGNOSIS — I10 Essential (primary) hypertension: Secondary | ICD-10-CM

## 2014-01-30 DIAGNOSIS — K1379 Other lesions of oral mucosa: Secondary | ICD-10-CM | POA: Insufficient documentation

## 2014-01-30 DIAGNOSIS — F411 Generalized anxiety disorder: Secondary | ICD-10-CM

## 2014-01-30 DIAGNOSIS — K1370 Unspecified lesions of oral mucosa: Secondary | ICD-10-CM

## 2014-01-30 MED ORDER — TRIAMCINOLONE ACETONIDE 0.1 % MT PSTE: 1.0000 "application " | PASTE | Freq: Two times a day (BID) | OROMUCOSAL | Status: DC

## 2014-01-30 NOTE — Patient Instructions (Addendum)
Please take all new medication as prescribed - the paste Please continue all other medications as before, and refills have been done if requested. Please have the pharmacy call with any other refills you may need.  Please call for referral to oral surgury in 3-5 days if not improving

## 2014-01-30 NOTE — Progress Notes (Signed)
Pre-visit discussion using our clinic review tool. No additional management support is needed unless otherwise documented below in the visit note.  
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
## 2014-01-31 NOTE — Assessment & Plan Note (Signed)
stable overall by history and exam, and pt to continue medical treatment as before,  to f/u any worsening symptoms or concerns 
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
## 2014-01-31 NOTE — Assessment & Plan Note (Signed)
stable overall by history and exam, recent data reviewed with pt, and pt to continue medical treatment as before,  to f/u any worsening symptoms or concerns BP Readings from Last 3 Encounters:  01/30/14 122/80  10/24/13 152/100  08/29/13 150/102

## 2014-03-18 ENCOUNTER — Other Ambulatory Visit (INDEPENDENT_AMBULATORY_CARE_PROVIDER_SITE_OTHER): Payer: BC Managed Care – PPO

## 2014-03-18 ENCOUNTER — Encounter: Payer: Self-pay | Admitting: Internal Medicine

## 2014-03-18 ENCOUNTER — Ambulatory Visit (INDEPENDENT_AMBULATORY_CARE_PROVIDER_SITE_OTHER): Payer: BC Managed Care – PPO | Admitting: Internal Medicine

## 2014-03-18 VITALS — BP 142/88 | HR 70 | Temp 98.0°F | Ht 72.0 in | Wt 179.0 lb

## 2014-03-18 DIAGNOSIS — R519 Headache, unspecified: Secondary | ICD-10-CM

## 2014-03-18 DIAGNOSIS — R110 Nausea: Secondary | ICD-10-CM

## 2014-03-18 DIAGNOSIS — R51 Headache: Secondary | ICD-10-CM

## 2014-03-18 DIAGNOSIS — I10 Essential (primary) hypertension: Secondary | ICD-10-CM

## 2014-03-18 LAB — CBC WITH DIFFERENTIAL/PLATELET
Basophils Absolute: 0.1 10*3/uL (ref 0.0–0.1)
Basophils Relative: 0.4 % (ref 0.0–3.0)
Eosinophils Absolute: 0 10*3/uL (ref 0.0–0.7)
Eosinophils Relative: 0.4 % (ref 0.0–5.0)
HCT: 43.1 % (ref 39.0–52.0)
Hemoglobin: 14.7 g/dL (ref 13.0–17.0)
Lymphocytes Relative: 34.2 % (ref 12.0–46.0)
Lymphs Abs: 4.1 10*3/uL — ABNORMAL HIGH (ref 0.7–4.0)
MCHC: 34 g/dL (ref 30.0–36.0)
MCV: 92.4 fl (ref 78.0–100.0)
Monocytes Absolute: 1.1 10*3/uL — ABNORMAL HIGH (ref 0.1–1.0)
Monocytes Relative: 8.9 % (ref 3.0–12.0)
Neutro Abs: 6.7 10*3/uL (ref 1.4–7.7)
Neutrophils Relative %: 56.1 % (ref 43.0–77.0)
Platelets: 265 10*3/uL (ref 150.0–400.0)
RBC: 4.66 Mil/uL (ref 4.22–5.81)
RDW: 14.1 % (ref 11.5–14.6)
WBC: 12 10*3/uL — ABNORMAL HIGH (ref 4.5–10.5)

## 2014-03-18 LAB — HEPATIC FUNCTION PANEL
ALT: 24 U/L (ref 0–53)
AST: 26 U/L (ref 0–37)
Albumin: 4.5 g/dL (ref 3.5–5.2)
Alkaline Phosphatase: 88 U/L (ref 39–117)
Bilirubin, Direct: 0.2 mg/dL (ref 0.0–0.3)
Total Bilirubin: 1.3 mg/dL — ABNORMAL HIGH (ref 0.3–1.2)
Total Protein: 8.1 g/dL (ref 6.0–8.3)

## 2014-03-18 LAB — BASIC METABOLIC PANEL
BUN: 11 mg/dL (ref 6–23)
CO2: 29 mEq/L (ref 19–32)
Calcium: 9.5 mg/dL (ref 8.4–10.5)
Chloride: 101 mEq/L (ref 96–112)
Creatinine, Ser: 0.8 mg/dL (ref 0.4–1.5)
GFR: 102.95 mL/min (ref >60.00)
Glucose, Bld: 86 mg/dL (ref 70–99)
Potassium: 4.3 mEq/L (ref 3.5–5.1)
Sodium: 138 mEq/L (ref 135–145)

## 2014-03-18 MED ORDER — TRAMADOL HCL 50 MG PO TABS: 50.0000 mg | ORAL_TABLET | Freq: Two times a day (BID) | ORAL | Status: DC | PRN

## 2014-03-18 MED ORDER — AMOXICILLIN-POT CLAVULANATE 875-125 MG PO TABS: 1.0000 | ORAL_TABLET | Freq: Two times a day (BID) | ORAL | Status: DC

## 2014-03-18 MED ORDER — INDOMETHACIN 50 MG PO CAPS
50.0000 mg | ORAL_CAPSULE | Freq: Three times a day (TID) | ORAL | Status: DC
Start: 2014-03-18 — End: 2015-11-23

## 2014-03-18 NOTE — Progress Notes (Signed)
Pre visit review using our clinic review tool, if applicable. No additional management support is needed unless otherwise documented below in the visit note. 

## 2014-03-18 NOTE — Assessment & Plan Note (Signed)
CT head Labs

## 2014-03-18 NOTE — Progress Notes (Signed)
   Subjective:    Patient ID: Daniel Richard, male    DOB: 09-26-1956, 58 y.o.   MRN: [id number]  Headache  This is a new problem. The current episode started more than 1 month ago. The problem has been waxing and waning. The pain is located in the left unilateral and temporal region. The pain radiates to the face and left neck. The pain quality is not similar to prior headaches. The quality of the pain is described as dull. The pain is at a severity of 10/10 (now it is 4/10 dull). The pain is severe.      Review of Systems  Neurological: Positive for headaches.       Objective:   Physical Exam        Assessment & Plan:

## 2014-03-18 NOTE — Assessment & Plan Note (Signed)
Severe HAx1 mo CT head Labs  PO Abx NSAIDs Tramadol prn

## 2014-03-19 ENCOUNTER — Ambulatory Visit (INDEPENDENT_AMBULATORY_CARE_PROVIDER_SITE_OTHER)
Admission: RE | Admit: 2014-03-19 | Discharge: 2014-03-19 | Disposition: A | Discharge status: Home / Self Care | Payer: BC Managed Care – PPO | Source: Ambulatory Visit | Attending: Internal Medicine | Admitting: Internal Medicine

## 2014-03-19 DIAGNOSIS — R519 Headache, unspecified: Secondary | ICD-10-CM

## 2014-03-19 DIAGNOSIS — R110 Nausea: Secondary | ICD-10-CM

## 2014-03-19 DIAGNOSIS — R51 Headache: Secondary | ICD-10-CM

## 2014-03-19 LAB — SEDIMENTATION RATE: Sed Rate: 20 mm/hr (ref 0–22)

## 2014-03-25 ENCOUNTER — Encounter: Payer: Self-pay | Admitting: Internal Medicine

## 2014-03-25 ENCOUNTER — Telehealth: Payer: Self-pay | Admitting: Internal Medicine

## 2014-03-25 ENCOUNTER — Ambulatory Visit (INDEPENDENT_AMBULATORY_CARE_PROVIDER_SITE_OTHER): Payer: BC Managed Care – PPO | Admitting: Internal Medicine

## 2014-03-25 VITALS — BP 120/80 | HR 94 | Temp 98.1°F | Ht 72.0 in | Wt 182.1 lb

## 2014-03-25 DIAGNOSIS — E785 Hyperlipidemia, unspecified: Secondary | ICD-10-CM

## 2014-03-25 DIAGNOSIS — M531 Cervicobrachial syndrome: Secondary | ICD-10-CM

## 2014-03-25 DIAGNOSIS — I10 Essential (primary) hypertension: Secondary | ICD-10-CM

## 2014-03-25 DIAGNOSIS — J329 Chronic sinusitis, unspecified: Secondary | ICD-10-CM | POA: Insufficient documentation

## 2014-03-25 DIAGNOSIS — M5481 Occipital neuralgia: Secondary | ICD-10-CM

## 2014-03-25 MED ORDER — NORTRIPTYLINE HCL 10 MG PO CAPS: 10.0000 mg | ORAL_CAPSULE | Freq: Every day | ORAL | Status: DC

## 2014-03-25 MED ORDER — NORTRIPTYLINE HCL 25 MG PO CAPS: 25.0000 mg | ORAL_CAPSULE | Freq: Every day | ORAL | Status: DC

## 2014-03-25 NOTE — Assessment & Plan Note (Signed)
Ok to finish antibiotic as rx.

## 2014-03-25 NOTE — Telephone Encounter (Signed)
Relevant patient education assigned to patient using Emmi. ° °

## 2014-03-25 NOTE — Progress Notes (Signed)
Subjective:    Patient ID: Daniel Richard, male    DOB: 12/15/1955, 58 y.o.   MRN: [id number]  HPI  Here to f/u, suggested ? 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  Sinus symptoms much improved.  Pt denies new neurological symptoms such as new headache, or facial or extremity weakness or numbness. Pain left post neck worse to lie down if doesnt take nsaid and half xanaflex.   Pt requests lab f/u as well discussion.  Pt denies chest pain, increased sob or doe, wheezing, orthopnea, PND, increased LE swelling, palpitations, dizziness or syncope. Past Medical History  Diagnosis Date  . ALLERGIC RHINITIS 07/16/2007  . ANXIETY 07/16/2007  . LOW BACK PAIN 07/16/2007  . HYPERTENSION 07/16/2007  . HYPERLIPIDEMIA 12/24/2009   No past surgical history on file.  reports that he has been smoking.  He does not have any smokeless tobacco history on file. He reports that he does not use illicit drugs. His alcohol history is not on file. family history includes Hypertension in his other. Allergies  Allergen Reactions  . Lipitor [Atorvastatin]     myalgia  . Meperidine Hcl    Current Outpatient Prescriptions on File Prior to Visit  Medication Sig Dispense Refill  . amLODipine (NORVASC) 2.5 MG tablet Take 1 tablet (2.5 mg total) by mouth daily.  90 tablet  3  . amoxicillin-clavulanate (AUGMENTIN) 875-125 MG per tablet Take 1 tablet by mouth 2 (two) times daily.  20 tablet  0  . aspirin 81 MG EC tablet Take 1 tablet (81 mg total) by mouth daily. Swallow whole.  30 tablet  12  . diazepam (VALIUM) 5 MG tablet Take 1 tablet (5 mg total) by mouth every 12 (twelve) hours as needed for anxiety.  30 tablet  1  . fexofenadine (ALLEGRA) 180 MG tablet Take 1 tablet (180 mg total) by mouth daily.  90 tablet  3  . fluticasone  (FLONASE) 50 MCG/ACT nasal spray Place 2 sprays into both nostrils daily.  16 g  5  . indomethacin (INDOCIN) 50 MG capsule Take 1 capsule (50 mg total) by mouth 3 (three) times daily with meals.  60 capsule  0  . simvastatin (ZOCOR) 20 MG tablet Take 1 tablet (20 mg total) by mouth at bedtime.  90 tablet  3  . tiZANidine (ZANAFLEX) 4 MG tablet Take 1 tablet (4 mg total) by mouth every 6 (six) hours as needed for muscle spasms.  40 tablet  1  . traMADol (ULTRAM) 50 MG tablet Take 1-2 tablets (50-100 mg total) by mouth 2 (two) times daily as needed.  60 tablet  3  . triamcinolone (KENALOG) 0.1 % paste Use as directed 1 application in the mouth or throat 2 (two) times daily.  5 g  12   No current facility-administered medications on file prior to visit.    Review of Systems  Constitutional: Negative for unexpected weight change, or unusual diaphoresis  HENT: Negative for tinnitus.   Eyes: Negative for photophobia and visual disturbance.  Respiratory: Negative for choking and stridor.   Gastrointestinal: Negative for vomiting and blood in stool.  Genitourinary: Negative for hematuria and decreased urine volume.  Musculoskeletal: Negative for acute joint swelling Skin: Negative for color change and wound.  Neurological: Negative for tremors and numbness other  than noted  Psychiatric/Behavioral: Negative for decreased concentration or  hyperactivity.       Objective:   Physical Exam BP 120/80  Pulse 94  Temp(Src) 98.1 F (36.7 C) (Oral)  Ht 6' (1.829 m)  Wt 182 lb 2 oz (82.611 kg)  BMI 24.70 kg/m2  SpO2 92% VS noted,  Constitutional: Pt appears well-developed and well-nourished.  HENT: Head: NCAT.  Right Ear: External ear normal.  Left Ear: External ear normal.  Eyes: Conjunctivae and EOM are normal. Pupils are equal, round, and reactive to light.  Neck: Normal range of motion. Neck supple.  Cardiovascular: Normal rate and regular rhythm.   Pulmonary/Chest: Effort normal and breath  sounds normal.  Abd:  Soft, NT, non-distended, + BS Neurological: Pt is alert. Not confused . Motor 5/5 Post neck NT, no swelling, redness Skin: Skin is warm. No erythema.  Psychiatric: Pt behavior is normal. Thought content normal.         Assessment & Plan:

## 2014-03-25 NOTE — Assessment & Plan Note (Signed)
Vs other cervicogenic pain, Ok to try nortyptilene 10 qhs for 1 wk, then 25 mg qhs after that

## 2014-03-25 NOTE — Assessment & Plan Note (Signed)
stable overall by history and exam, recent data reviewed with pt, and pt to continue medical treatment as before,  to f/u any worsening symptoms or concerns BP Readings from Last 3 Encounters:  03/25/14 120/80  03/18/14 142/88  01/30/14 122/80

## 2014-03-25 NOTE — Patient Instructions (Signed)
OK to finish your current treatment, and you should not need further antbiotics  Please take all new medication as prescribed - the 10 mg of nortryptilene at bedtime for 1 week, then 25 mg at bedtime after that  You can try off the medication in a few weeks to see if the pain has resolved, if it has, then ok to stop the nortryptilene  Please continue all other medications as before, and refills have been done if requested. Please have the pharmacy call with any other refills you may need.  Please continue your efforts at being more active, low cholesterol diet, and weight control.  No need for further lab work today

## 2014-03-25 NOTE — Progress Notes (Signed)
Pre visit review using our clinic review tool, if applicable. No additional management support is needed unless otherwise documented below in the visit note. 

## 2014-03-25 NOTE — Assessment & Plan Note (Signed)
stable overall by history and exam, recent data reviewed with pt, and pt to continue medical treatment as before,  to f/u any worsening symptoms or concerns Lab Results  Component Value Date   LDLCALC 116* 11/24/2013   For lower chol diet

## 2014-03-29 ENCOUNTER — Encounter: Payer: Self-pay | Admitting: Internal Medicine

## 2014-03-29 DIAGNOSIS — R519 Headache, unspecified: Secondary | ICD-10-CM | POA: Insufficient documentation

## 2014-03-29 DIAGNOSIS — R51 Headache: Secondary | ICD-10-CM | POA: Insufficient documentation

## 2014-03-29 NOTE — Assessment & Plan Note (Signed)
4/15 L sided HA ?etiology - poss sinusitis Augmentin Indocin Tramadol prn

## 2014-03-29 NOTE — Assessment & Plan Note (Signed)
BP Readings from Last 3 Encounters:  03/25/14 120/80  03/18/14 142/88  01/30/14 122/80

## 2014-04-28 ENCOUNTER — Encounter: Payer: Self-pay | Admitting: Internal Medicine

## 2014-05-13 ENCOUNTER — Ambulatory Visit (INDEPENDENT_AMBULATORY_CARE_PROVIDER_SITE_OTHER): Payer: BC Managed Care – PPO | Admitting: Internal Medicine

## 2014-05-13 ENCOUNTER — Encounter: Payer: Self-pay | Admitting: Internal Medicine

## 2014-05-13 VITALS — BP 134/82 | HR 85 | Temp 98.8°F | Wt 177.4 lb

## 2014-05-13 DIAGNOSIS — J029 Acute pharyngitis, unspecified: Secondary | ICD-10-CM

## 2014-05-13 DIAGNOSIS — J309 Allergic rhinitis, unspecified: Secondary | ICD-10-CM

## 2014-05-13 DIAGNOSIS — R05 Cough: Secondary | ICD-10-CM

## 2014-05-13 DIAGNOSIS — R059 Cough, unspecified: Secondary | ICD-10-CM

## 2014-05-13 MED ORDER — PROMETHAZINE-DM 6.25-15 MG/5ML PO SYRP: 5.0000 mL | ORAL_SOLUTION | Freq: Four times a day (QID) | ORAL | Status: DC | PRN

## 2014-05-13 MED ORDER — LEVOFLOXACIN 500 MG PO TABS: 500.0000 mg | ORAL_TABLET | Freq: Every day | ORAL | Status: DC

## 2014-05-13 NOTE — Patient Instructions (Signed)
It was good to see you today.  Levquin antibiotics and prescription cough syrup - Your prescription(s) have been submitted to your pharmacy. Please take as directed and contact our office if you believe you are having problem(s) with the medication(s).  Alternate between ibuprofen and tylenol for aches, pain and fever symptoms as discussed  Take allergy medications including Allegra and Flonase spray daily for next 10 days as discussed/reviewed  Hydrate, rest and call if worse or unimproved

## 2014-05-13 NOTE — Progress Notes (Signed)
Subjective:    Patient ID: Daniel Richard, male    DOB: [birth year], 57 y.o.   MRN: [id number]  Cough This is a new problem. The current episode started in the past 7 days. The problem has been gradually worsening. The problem occurs every few minutes. The cough is productive of sputum. Associated symptoms include chills, nasal congestion, postnasal drip and a sore throat. Pertinent negatives include no heartburn, hemoptysis, shortness of breath, sweats or wheezing.    Also reviewed chronic medical issues and interval medical events  Past Medical History  Diagnosis Date  . ALLERGIC RHINITIS   . ANXIETY   . LOW BACK PAIN   . HYPERTENSION   . HYPERLIPIDEMIA     Review of Systems  Constitutional: Positive for chills.  HENT: Positive for postnasal drip and sore throat.   Respiratory: Positive for cough. Negative for hemoptysis, shortness of breath and wheezing.   Gastrointestinal: Negative for heartburn.       Objective:   Physical Exam  BP 134/82  Pulse 85  Temp(Src) 98.8 F (37.1 C) (Oral)  Wt 177 lb 6.4 oz (80.468 kg)  SpO2 96% Wt Readings from Last 3 Encounters:  05/13/14 177 lb 6.4 oz (80.468 kg)  03/25/14 182 lb 2 oz (82.611 kg)  03/18/14 179 lb (81.194 kg)   Constitutional: he appears well-developed and well-nourished. No distress.  HENT: NCAT, Ears with hazy TMs but no erythema or effusion, OP with erythema, no exudates but evidence for postnasal drip Neck: Normal range of motion. Neck supple. No JVD or LAD present. No thyromegaly present.  Cardiovascular: Normal rate, regular rhythm and normal heart sounds.  No murmur heard. No BLE edema. Pulmonary/Chest: Effort normal and breath sounds with scattered rhonchi. No respiratory distress. he has no wheezes.  Psychiatric: he has a normal mood and affect. His behavior is normal. Judgment and thought content normal.   Lab Results  Component Value Date   WBC 12.0* 03/18/2014   HGB 14.7 03/18/2014   HCT 43.1 03/18/2014     PLT 265.0 03/18/2014   GLUCOSE 86 03/18/2014   CHOL 196 11/24/2013   TRIG 199.0* 11/24/2013   HDL 40.00 11/24/2013   LDLDIRECT 195.1 10/24/2013   LDLCALC 116* 11/24/2013   ALT 24 03/18/2014   AST 26 03/18/2014   NA 138 03/18/2014   K 4.3 03/18/2014   CL 101 03/18/2014   CREATININE 0.8 03/18/2014   BUN 11 03/18/2014   CO2 29 03/18/2014   TSH 1.80 11/24/2013   PSA 0.50 11/24/2013   INR 1.1* 02/07/2013    Ct Head Wo Contrast  03/19/2014   CLINICAL DATA:  Left-sided headache  EXAM: CT HEAD WITHOUT CONTRAST  TECHNIQUE: Contiguous axial images were obtained from the base of the skull through the vertex without intravenous contrast.  COMPARISON:  None.  FINDINGS: There is no evidence of mass effect, midline shift or extra-axial fluid collections. There is no evidence of a space-occupying lesion or intracranial hemorrhage. There is no evidence of a cortical-based area of acute infarction.  The ventricles and sulci are appropriate for the patient's age. The basal cisterns are patent.  Visualized portions of the orbits are unremarkable. The visualized portions of the paranasal sinuses and mastoid air cells are unremarkable.  The osseous structures are unremarkable.  IMPRESSION: Normal CT brain without intravenous contrast.   Electronically Signed   By: Kathreen Devoid   On: 03/19/2014 14:34       Assessment & Plan:   Cough Pharyngitis allergic  rhinitis   Will treat with empiric antibiotics and prescription cough suppressant  Continue allergy medication  Call if worse or unimproved

## 2014-05-13 NOTE — Progress Notes (Signed)
Pre visit review using our clinic review tool, if applicable. No additional management support is needed unless otherwise documented below in the visit note. 

## 2014-06-26 ENCOUNTER — Telehealth: Payer: Self-pay | Admitting: Internal Medicine

## 2014-06-26 MED ORDER — AMLODIPINE BESYLATE 2.5 MG PO TABS: 2.5000 mg | ORAL_TABLET | Freq: Every day | ORAL | Status: DC

## 2014-06-26 NOTE — Telephone Encounter (Signed)
Called the patient sent in refill to [facility] [address] for Amlodipine.

## 2014-06-26 NOTE — Telephone Encounter (Signed)
Pt request a call from the assistant concern about amlodipine. Please give pt a call

## 2014-10-15 ENCOUNTER — Other Ambulatory Visit: Payer: Self-pay | Admitting: *Deleted

## 2014-10-15 MED ORDER — TIZANIDINE HCL 4 MG PO TABS: 4.0000 mg | ORAL_TABLET | Freq: Four times a day (QID) | ORAL | Status: DC | PRN

## 2014-10-15 NOTE — Telephone Encounter (Signed)
Pt stated [facility] to get [facility]ill sent on his zanaflex. Inform [facility].../lmb

## 2014-12-21 ENCOUNTER — Other Ambulatory Visit: Payer: Self-pay | Admitting: Internal Medicine

## 2015-03-18 ENCOUNTER — Other Ambulatory Visit: Payer: Self-pay | Admitting: Internal Medicine

## 2015-05-12 ENCOUNTER — Telehealth: Payer: Self-pay | Admitting: Internal Medicine

## 2015-05-12 MED ORDER — SIMVASTATIN 20 MG PO TABS: 20.0000 mg | ORAL_TABLET | Freq: Every day | ORAL | Status: DC

## 2015-05-12 NOTE — Telephone Encounter (Signed)
Patient is requesting a refill for simvastatin (ZOCOR) 20 MG tablet [[id number]. I advised patient he would need to come in for ov, but he said he explained to Dr. Jenny Reichmann when he was in with his wife that he is very busy and hard to get away. [facility] [address]

## 2015-06-11 ENCOUNTER — Other Ambulatory Visit: Payer: Self-pay | Admitting: Internal Medicine

## 2015-06-16 ENCOUNTER — Other Ambulatory Visit: Payer: Self-pay | Admitting: Internal Medicine

## 2015-06-18 ENCOUNTER — Ambulatory Visit (INDEPENDENT_AMBULATORY_CARE_PROVIDER_SITE_OTHER): Payer: BLUE CROSS/BLUE SHIELD | Admitting: Internal Medicine

## 2015-06-18 ENCOUNTER — Other Ambulatory Visit (INDEPENDENT_AMBULATORY_CARE_PROVIDER_SITE_OTHER): Payer: BLUE CROSS/BLUE SHIELD

## 2015-06-18 ENCOUNTER — Encounter: Payer: Self-pay | Admitting: Internal Medicine

## 2015-06-18 VITALS — BP 126/82 | HR 93 | Temp 97.9°F | Ht 72.0 in | Wt 174.0 lb

## 2015-06-18 DIAGNOSIS — Z0000 Encounter for general adult medical examination without abnormal findings: Secondary | ICD-10-CM

## 2015-06-18 DIAGNOSIS — Z23 Encounter for immunization: Secondary | ICD-10-CM | POA: Diagnosis not present

## 2015-06-18 LAB — LIPID PANEL
Cholesterol: 203 mg/dL — ABNORMAL HIGH (ref 0–200)
HDL: 55.6 mg/dL (ref >39.00)
LDL Cholesterol: 129 mg/dL — ABNORMAL HIGH (ref 0–99)
NonHDL: 147.4
Total CHOL/HDL Ratio: 4
Triglycerides: 91 mg/dL (ref 0.0–149.0)
VLDL: 18.2 mg/dL (ref 0.0–40.0)

## 2015-06-18 LAB — CBC WITH DIFFERENTIAL/PLATELET
Basophils Absolute: 0 10*3/uL (ref 0.0–0.1)
Basophils Relative: 0.3 % (ref 0.0–3.0)
Eosinophils Absolute: 0.1 10*3/uL (ref 0.0–0.7)
Eosinophils Relative: 0.5 % (ref 0.0–5.0)
HCT: 43.5 % (ref 39.0–52.0)
Hemoglobin: 14.5 g/dL (ref 13.0–17.0)
Lymphocytes Relative: 31.1 % (ref 12.0–46.0)
Lymphs Abs: 2.9 10*3/uL (ref 0.7–4.0)
MCHC: 33.3 g/dL (ref 30.0–36.0)
MCV: 94.9 fl (ref 78.0–100.0)
Monocytes Absolute: 1 10*3/uL (ref 0.1–1.0)
Monocytes Relative: 11 % (ref 3.0–12.0)
Neutro Abs: 5.4 10*3/uL (ref 1.4–7.7)
Neutrophils Relative %: 57.1 % (ref 43.0–77.0)
Platelets: 271 10*3/uL (ref 150.0–400.0)
RBC: 4.59 Mil/uL (ref 4.22–5.81)
RDW: 13.6 % (ref 11.5–15.5)
WBC: 9.5 10*3/uL (ref 4.0–10.5)

## 2015-06-18 LAB — URINALYSIS, ROUTINE W REFLEX MICROSCOPIC
Bilirubin Urine: NEGATIVE
Hgb urine dipstick: NEGATIVE
Ketones, ur: NEGATIVE
Leukocytes, UA: NEGATIVE
Nitrite: NEGATIVE
RBC / HPF: NONE SEEN (ref 0–?)
Specific Gravity, Urine: <=1.005 — AB (ref 1.000–1.030)
Total Protein, Urine: NEGATIVE
Urine Glucose: NEGATIVE
Urobilinogen, UA: 0.2 (ref 0.0–1.0)
pH: 7 (ref 5.0–8.0)

## 2015-06-18 LAB — HEPATIC FUNCTION PANEL
ALT: 23 U/L (ref 0–53)
AST: 28 U/L (ref 0–37)
Albumin: 4.5 g/dL (ref 3.5–5.2)
Alkaline Phosphatase: 88 U/L (ref 39–117)
Bilirubin, Direct: 0.1 mg/dL (ref 0.0–0.3)
Total Bilirubin: 0.8 mg/dL (ref 0.2–1.2)
Total Protein: 7.6 g/dL (ref 6.0–8.3)

## 2015-06-18 LAB — PSA: PSA: 0.4 ng/mL (ref 0.10–4.00)

## 2015-06-18 LAB — BASIC METABOLIC PANEL
BUN: 11 mg/dL (ref 6–23)
CO2: 31 mEq/L (ref 19–32)
Calcium: 9.9 mg/dL (ref 8.4–10.5)
Chloride: 102 mEq/L (ref 96–112)
Creatinine, Ser: 0.76 mg/dL (ref 0.40–1.50)
GFR: 111.89 mL/min (ref >60.00)
Glucose, Bld: 81 mg/dL (ref 70–99)
Potassium: 4.7 mEq/L (ref 3.5–5.1)
Sodium: 139 mEq/L (ref 135–145)

## 2015-06-18 LAB — TSH: TSH: 2.07 u[IU]/mL (ref 0.35–4.50)

## 2015-06-18 MED ORDER — AMLODIPINE BESYLATE 2.5 MG PO TABS: 2.5000 mg | ORAL_TABLET | Freq: Every day | ORAL | Status: DC

## 2015-06-18 MED ORDER — SIMVASTATIN 20 MG PO TABS: 20.0000 mg | ORAL_TABLET | Freq: Every day | ORAL | Status: DC

## 2015-06-18 NOTE — Progress Notes (Signed)
Pre visit review using our clinic review tool, if applicable. No additional management support is needed unless otherwise documented below in the visit note. 

## 2015-06-18 NOTE — Assessment & Plan Note (Signed)

## 2015-06-18 NOTE — Patient Instructions (Addendum)

## 2015-06-18 NOTE — Addendum Note (Signed)
Addended by: Lyman Bishop on: 06/18/2015 04:50 PM   Modules accepted: Orders

## 2015-06-18 NOTE — Progress Notes (Signed)
Subjective:    Patient ID: Daniel Richard, male    DOB: 03-02-1956, 59 y.o.   MRN: [id number]  HPI    Here for wellness and f/u;  Overall doing ok;  Pt denies Chest pain, worsening SOB, DOE, wheezing, orthopnea, PND, worsening LE edema, palpitations, dizziness or syncope.  Pt denies neurological change such as new headache, facial or extremity weakness.  Pt denies polydipsia, polyuria, or low sugar symptoms. Pt states overall good compliance with treatment and medications, good tolerability, and has been trying to follow appropriate diet.  Pt denies worsening depressive symptoms, suicidal ideation or panic. No fever, night sweats, wt loss, loss of appetite, or other constitutional symptoms.  Pt states good ability with ADL's, has low fall risk, home safety reviewed and adequate, no other significant changes in hearing or vision, and only occasionally active with exercise. Bp at home average 120/88. Wt Readings from Last 3 Encounters:  06/18/15 174 lb (78.926 kg)  05/13/14 177 lb 6.4 oz (80.468 kg)  03/25/14 182 lb 2 oz (82.611 kg)  no tobacco now for 3 yrs. ETOH occas only. Declines colonosopcy.  Could not tolerate nortryptilene due to memory issues, but no issues with the statin Past Medical History  Diagnosis Date  . ALLERGIC RHINITIS   . ANXIETY   . LOW BACK PAIN   . HYPERTENSION   . HYPERLIPIDEMIA    No past surgical history on file.  reports that he quit smoking about 2 years ago. He does not have any smokeless tobacco history on file. He reports that he does not use illicit drugs. His alcohol history is not on file. family history includes Hypertension in his other. Allergies  Allergen Reactions  . Lipitor [Atorvastatin]     myalgia  . Meperidine Hcl    Current Outpatient Prescriptions on File Prior to Visit  Medication Sig Dispense Refill  . aspirin 81 MG EC tablet Take 1 tablet (81 mg total) by mouth daily. Swallow whole. 30 tablet 12  . fexofenadine (ALLEGRA) 180 MG tablet  Take 1 tablet (180 mg total) by mouth daily. 90 tablet 3  . fluticasone (FLONASE) 50 MCG/ACT nasal spray Place 2 sprays into both nostrils daily. 16 g 5  . indomethacin (INDOCIN) 50 MG capsule Take 1 capsule (50 mg total) by mouth 3 (three) times daily with meals. 60 capsule 0  . tiZANidine (ZANAFLEX) 4 MG tablet Take 1 tablet (4 mg total) by mouth every 6 (six) hours as needed for muscle spasms. 40 tablet 0  . traMADol (ULTRAM) 50 MG tablet Take 1-2 tablets (50-100 mg total) by mouth 2 (two) times daily as needed. 60 tablet 3  . triamcinolone (KENALOG) 0.1 % paste Use as directed 1 application in the mouth or throat 2 (two) times daily. 5 g 12  . levofloxacin (LEVAQUIN) 500 MG tablet Take 1 tablet (500 mg total) by mouth daily. (Patient not taking: Reported on 06/18/2015) 7 tablet 0  . promethazine-dextromethorphan (PROMETHAZINE-DM) 6.25-15 MG/5ML syrup Take 5 mLs by mouth 4 (four) times daily as needed for cough. (Patient not taking: Reported on 06/18/2015) 180 mL 0   No current facility-administered medications on file prior to visit.      Review of Systems Constitutional: Negative for increased diaphoresis, other activity, appetite or siginficant weight change other than noted HENT: Negative for worsening hearing loss, ear pain, facial swelling, mouth sores and neck stiffness.   Eyes: Negative for other worsening pain, redness or visual disturbance.  Respiratory: Negative for shortness of  breath and wheezing  Cardiovascular: Negative for chest pain and palpitations.  Gastrointestinal: Negative for diarrhea, blood in stool, abdominal distention or other pain Genitourinary: Negative for hematuria, flank pain or change in urine volume.  Musculoskeletal: Negative for myalgias or other joint complaints.  Skin: Negative for color change and wound or drainage.  Neurological: Negative for syncope and numbness. other than noted Hematological: Negative for adenopathy. or other  swelling Psychiatric/Behavioral: Negative for hallucinations, SI, self-injury, decreased concentration or other worsening agitation.      Objective:   Physical Exam BP 126/82 mmHg  Pulse 93  Temp(Src) 97.9 F (36.6 C) (Oral)  Ht 6' (1.829 m)  Wt 174 lb (78.926 kg)  BMI 23.59 kg/m2  SpO2 97% VS noted,  Constitutional: Pt is oriented to person, place, and time. Appears well-developed and well-nourished, in no significant distress Head: Normocephalic and atraumatic.  Right Ear: External ear normal.  Left Ear: External ear normal.  Nose: Nose normal.  Mouth/Throat: Oropharynx is clear and moist.  Eyes: Conjunctivae and EOM are normal. Pupils are equal, round, and reactive to light.  Neck: Normal range of motion. Neck supple. No JVD present. No tracheal deviation present or significant neck LA or mass Cardiovascular: Normal rate, regular rhythm, normal heart sounds and intact distal pulses.   Pulmonary/Chest: Effort normal and breath sounds without rales or wheezing  Abdominal: Soft. Bowel sounds are normal. NT. No HSM  Musculoskeletal: Normal range of motion. Exhibits no edema.  Lymphadenopathy:  Has no cervical adenopathy.  Neurological: Pt is alert and oriented to person, place, and time. Pt has normal reflexes. No cranial nerve deficit. Motor grossly intact Skin: Skin is warm and dry. No rash noted.  Psychiatric:  Has normal mood and affect. Behavior is normal.         Assessment & Plan:

## 2015-06-21 ENCOUNTER — Telehealth: Payer: Self-pay | Admitting: Internal Medicine

## 2015-06-21 NOTE — Telephone Encounter (Signed)
Patient is unable to see his lab work on [employer]. Can you please call him with lab results

## 2015-06-21 NOTE — Telephone Encounter (Signed)
Pt advised lab results normal, labs mailed also

## 2015-11-21 ENCOUNTER — Encounter (HOSPITAL_COMMUNITY)
Admission: EM | Disposition: A | Discharge status: Home / Self Care | Payer: Self-pay | Source: Home / Self Care | Attending: Internal Medicine

## 2015-11-21 ENCOUNTER — Inpatient Hospital Stay (HOSPITAL_COMMUNITY): Payer: BLUE CROSS/BLUE SHIELD

## 2015-11-21 ENCOUNTER — Emergency Department (HOSPITAL_COMMUNITY): Payer: BLUE CROSS/BLUE SHIELD

## 2015-11-21 ENCOUNTER — Encounter (HOSPITAL_COMMUNITY): Payer: Self-pay | Admitting: Emergency Medicine

## 2015-11-21 ENCOUNTER — Inpatient Hospital Stay (HOSPITAL_COMMUNITY)
Admission: EM | Admit: 2015-11-21 | Discharge: 2015-11-30 | DRG: 242 | Disposition: A | Discharge status: Home / Self Care | Payer: BLUE CROSS/BLUE SHIELD | Attending: Internal Medicine | Admitting: Internal Medicine

## 2015-11-21 DIAGNOSIS — E872 Acidosis: Secondary | ICD-10-CM | POA: Diagnosis present

## 2015-11-21 DIAGNOSIS — Z7982 Long term (current) use of aspirin: Secondary | ICD-10-CM

## 2015-11-21 DIAGNOSIS — J9602 Acute respiratory failure with hypercapnia: Secondary | ICD-10-CM | POA: Diagnosis present

## 2015-11-21 DIAGNOSIS — F419 Anxiety disorder, unspecified: Secondary | ICD-10-CM | POA: Diagnosis present

## 2015-11-21 DIAGNOSIS — Z79899 Other long term (current) drug therapy: Secondary | ICD-10-CM

## 2015-11-21 DIAGNOSIS — I2510 Atherosclerotic heart disease of native coronary artery without angina pectoris: Secondary | ICD-10-CM | POA: Diagnosis present

## 2015-11-21 DIAGNOSIS — I25119 Atherosclerotic heart disease of native coronary artery with unspecified angina pectoris: Secondary | ICD-10-CM | POA: Diagnosis present

## 2015-11-21 DIAGNOSIS — R0782 Intercostal pain: Secondary | ICD-10-CM | POA: Diagnosis present

## 2015-11-21 DIAGNOSIS — Z87891 Personal history of nicotine dependence: Secondary | ICD-10-CM

## 2015-11-21 DIAGNOSIS — I469 Cardiac arrest, cause unspecified: Secondary | ICD-10-CM | POA: Diagnosis present

## 2015-11-21 DIAGNOSIS — I4901 Ventricular fibrillation: Principal | ICD-10-CM | POA: Diagnosis present

## 2015-11-21 DIAGNOSIS — I10 Essential (primary) hypertension: Secondary | ICD-10-CM | POA: Diagnosis present

## 2015-11-21 DIAGNOSIS — R739 Hyperglycemia, unspecified: Secondary | ICD-10-CM | POA: Diagnosis present

## 2015-11-21 DIAGNOSIS — I472 Ventricular tachycardia: Secondary | ICD-10-CM | POA: Diagnosis present

## 2015-11-21 DIAGNOSIS — R509 Fever, unspecified: Secondary | ICD-10-CM

## 2015-11-21 DIAGNOSIS — J690 Pneumonitis due to inhalation of food and vomit: Secondary | ICD-10-CM | POA: Diagnosis present

## 2015-11-21 DIAGNOSIS — G9341 Metabolic encephalopathy: Secondary | ICD-10-CM | POA: Diagnosis present

## 2015-11-21 DIAGNOSIS — J811 Chronic pulmonary edema: Secondary | ICD-10-CM | POA: Diagnosis present

## 2015-11-21 DIAGNOSIS — F40240 Claustrophobia: Secondary | ICD-10-CM | POA: Diagnosis present

## 2015-11-21 DIAGNOSIS — I209 Angina pectoris, unspecified: Secondary | ICD-10-CM

## 2015-11-21 DIAGNOSIS — I119 Hypertensive heart disease without heart failure: Secondary | ICD-10-CM | POA: Diagnosis present

## 2015-11-21 DIAGNOSIS — G931 Anoxic brain damage, not elsewhere classified: Secondary | ICD-10-CM | POA: Diagnosis not present

## 2015-11-21 DIAGNOSIS — J9601 Acute respiratory failure with hypoxia: Secondary | ICD-10-CM | POA: Diagnosis present

## 2015-11-21 DIAGNOSIS — Z95810 Presence of automatic (implantable) cardiac defibrillator: Secondary | ICD-10-CM | POA: Diagnosis not present

## 2015-11-21 DIAGNOSIS — E876 Hypokalemia: Secondary | ICD-10-CM | POA: Diagnosis present

## 2015-11-21 DIAGNOSIS — Z789 Other specified health status: Secondary | ICD-10-CM

## 2015-11-21 DIAGNOSIS — I214 Non-ST elevation (NSTEMI) myocardial infarction: Secondary | ICD-10-CM | POA: Diagnosis not present

## 2015-11-21 DIAGNOSIS — Z888 Allergy status to other drugs, medicaments and biological substances status: Secondary | ICD-10-CM

## 2015-11-21 DIAGNOSIS — D649 Anemia, unspecified: Secondary | ICD-10-CM | POA: Diagnosis present

## 2015-11-21 DIAGNOSIS — Z885 Allergy status to narcotic agent status: Secondary | ICD-10-CM | POA: Diagnosis not present

## 2015-11-21 DIAGNOSIS — R000 Tachycardia, unspecified: Secondary | ICD-10-CM | POA: Diagnosis not present

## 2015-11-21 DIAGNOSIS — I480 Paroxysmal atrial fibrillation: Secondary | ICD-10-CM | POA: Diagnosis present

## 2015-11-21 DIAGNOSIS — E785 Hyperlipidemia, unspecified: Secondary | ICD-10-CM | POA: Diagnosis present

## 2015-11-21 DIAGNOSIS — G9340 Encephalopathy, unspecified: Secondary | ICD-10-CM | POA: Diagnosis present

## 2015-11-21 DIAGNOSIS — I481 Persistent atrial fibrillation: Secondary | ICD-10-CM | POA: Diagnosis present

## 2015-11-21 DIAGNOSIS — N170 Acute kidney failure with tubular necrosis: Secondary | ICD-10-CM | POA: Diagnosis present

## 2015-11-21 DIAGNOSIS — R570 Cardiogenic shock: Secondary | ICD-10-CM | POA: Diagnosis present

## 2015-11-21 DIAGNOSIS — Z959 Presence of cardiac and vascular implant and graft, unspecified: Secondary | ICD-10-CM | POA: Insufficient documentation

## 2015-11-21 DIAGNOSIS — J9600 Acute respiratory failure, unspecified whether with hypoxia or hypercapnia: Secondary | ICD-10-CM

## 2015-11-21 DIAGNOSIS — R079 Chest pain, unspecified: Secondary | ICD-10-CM | POA: Diagnosis not present

## 2015-11-21 DIAGNOSIS — I219 Acute myocardial infarction, unspecified: Secondary | ICD-10-CM

## 2015-11-21 DIAGNOSIS — Z9289 Personal history of other medical treatment: Secondary | ICD-10-CM

## 2015-11-21 HISTORY — PX: CARDIAC CATHETERIZATION: SHX172

## 2015-11-21 HISTORY — DX: Acute myocardial infarction, unspecified: I21.9

## 2015-11-21 HISTORY — DX: Cardiac arrest, cause unspecified: I46.9

## 2015-11-21 LAB — I-STAT TROPONIN, ED: Troponin i, poc: 0.11 ng/mL (ref 0.00–0.08)

## 2015-11-21 LAB — CBC
HCT: 44.4 % (ref 39.0–52.0)
Hemoglobin: 14.5 g/dL (ref 13.0–17.0)
MCH: 32.1 pg (ref 26.0–34.0)
MCHC: 32.7 g/dL (ref 30.0–36.0)
MCV: 98.2 fL (ref 78.0–100.0)
Platelets: 258 10*3/uL (ref 150–400)
RBC: 4.52 MIL/uL (ref 4.22–5.81)
RDW: 12.9 % (ref 11.5–15.5)
WBC: 20.8 10*3/uL — ABNORMAL HIGH (ref 4.0–10.5)

## 2015-11-21 LAB — I-STAT ARTERIAL BLOOD GAS, ED
Acid-base deficit: 8 mmol/L — ABNORMAL HIGH (ref 0.0–2.0)
Bicarbonate: 20.1 mEq/L (ref 20.0–24.0)
O2 Saturation: 97 %
Patient temperature: 96.8
TCO2: 22 mmol/L (ref 0–100)
pCO2 arterial: 50.4 mmHg — ABNORMAL HIGH (ref 35.0–45.0)
pH, Arterial: 7.203 — ABNORMAL LOW (ref 7.350–7.450)
pO2, Arterial: 112 mmHg — ABNORMAL HIGH (ref 80.0–100.0)

## 2015-11-21 LAB — I-STAT CG4 LACTIC ACID, ED: Lactic Acid, Venous: 9.44 mmol/L (ref 0.5–2.0)

## 2015-11-21 LAB — BASIC METABOLIC PANEL
Anion gap: 18 — ABNORMAL HIGH (ref 5–15)
BUN: 14 mg/dL (ref 6–20)
CO2: 17 mmol/L — ABNORMAL LOW (ref 22–32)
Calcium: 8.3 mg/dL — ABNORMAL LOW (ref 8.9–10.3)
Chloride: 105 mmol/L (ref 101–111)
Creatinine, Ser: 1.38 mg/dL — ABNORMAL HIGH (ref 0.61–1.24)
GFR calc Af Amer: >60 mL/min (ref >60)
GFR calc non Af Amer: 55 mL/min — ABNORMAL LOW (ref >60)
Glucose, Bld: 232 mg/dL — ABNORMAL HIGH (ref 65–99)
Potassium: 3.5 mmol/L (ref 3.5–5.1)
Sodium: 140 mmol/L (ref 135–145)

## 2015-11-21 LAB — I-STAT CHEM 8, ED
BUN: 18 mg/dL (ref 6–20)
Calcium, Ion: 1.08 mmol/L — ABNORMAL LOW (ref 1.12–1.23)
Chloride: 103 mmol/L (ref 101–111)
Creatinine, Ser: 1.2 mg/dL (ref 0.61–1.24)
Glucose, Bld: 233 mg/dL — ABNORMAL HIGH (ref 65–99)
HCT: 49 % (ref 39.0–52.0)
Hemoglobin: 16.7 g/dL (ref 13.0–17.0)
Potassium: 2.7 mmol/L — CL (ref 3.5–5.1)
Sodium: 142 mmol/L (ref 135–145)
TCO2: 20 mmol/L (ref 0–100)

## 2015-11-21 LAB — APTT: aPTT: 32 seconds (ref 24–37)

## 2015-11-21 LAB — TRIGLYCERIDES: Triglycerides: 182 mg/dL — ABNORMAL HIGH (ref <150)

## 2015-11-21 LAB — LACTIC ACID, PLASMA: Lactic Acid, Venous: 4 mmol/L (ref 0.5–2.0)

## 2015-11-21 LAB — GLUCOSE, CAPILLARY: Glucose-Capillary: 168 mg/dL — ABNORMAL HIGH (ref 65–99)

## 2015-11-21 LAB — PROTIME-INR
INR: 1.2 (ref 0.00–1.49)
Prothrombin Time: 15.4 seconds — ABNORMAL HIGH (ref 11.6–15.2)

## 2015-11-21 LAB — MAGNESIUM: Magnesium: 2.5 mg/dL — ABNORMAL HIGH (ref 1.7–2.4)

## 2015-11-21 LAB — CBG MONITORING, ED: Glucose-Capillary: 185 mg/dL — ABNORMAL HIGH (ref 65–99)

## 2015-11-21 SURGERY — LEFT HEART CATH AND CORONARY ANGIOGRAPHY
Anesthesia: General

## 2015-11-21 SURGERY — LEFT HEART CATH AND CORONARY ANGIOGRAPHY
Anesthesia: LOCAL

## 2015-11-21 MED ORDER — ASPIRIN 300 MG RE SUPP
300.0000 mg | RECTAL | Status: AC
  Administered 2015-11-21: 300 mg via RECTAL
  Filled 2015-11-21: qty 1

## 2015-11-21 MED ORDER — PROPOFOL 1000 MG/100ML IV EMUL
5.0000 ug/kg/min | Freq: Once | INTRAVENOUS | Status: DC
  Administered 2015-11-21: 30 ug/kg/min via INTRAVENOUS

## 2015-11-21 MED ORDER — VERAPAMIL HCL 2.5 MG/ML IV SOLN
INTRAVENOUS | Status: AC
  Filled 2015-11-21: qty 2

## 2015-11-21 MED ORDER — SODIUM CHLORIDE 0.9 % IV SOLN: 250.0000 mL | INTRAVENOUS | Status: DC | PRN

## 2015-11-21 MED ORDER — ANTISEPTIC ORAL RINSE SOLUTION (CORINZ)
7.0000 mL | OROMUCOSAL | Status: DC
  Administered 2015-11-22 – 2015-11-23 (×16): 7 mL via OROMUCOSAL

## 2015-11-21 MED ORDER — SODIUM CHLORIDE 0.9 % IV SOLN
INTRAVENOUS | Status: AC
  Administered 2015-11-21: 23:00:00 via INTRAVENOUS

## 2015-11-21 MED ORDER — MAGNESIUM SULFATE 2 GM/50ML IV SOLN
2.0000 g | Freq: Once | INTRAVENOUS | Status: AC
  Administered 2015-11-21: 2 g via INTRAVENOUS
  Filled 2015-11-21: qty 50

## 2015-11-21 MED ORDER — FENTANYL CITRATE (PF) 100 MCG/2ML IJ SOLN
100.0000 ug | INTRAMUSCULAR | Status: DC | PRN
  Administered 2015-11-22 (×2): 100 ug via INTRAVENOUS
  Filled 2015-11-21 (×2): qty 2

## 2015-11-21 MED ORDER — EPINEPHRINE HCL 1 MG/ML IJ SOLN
0.5000 ug/min | INTRAVENOUS | Status: DC
  Administered 2015-11-21: 10 ug/min via INTRAVENOUS
  Filled 2015-11-21 (×2): qty 4

## 2015-11-21 MED ORDER — NITROGLYCERIN 1 MG/10 ML FOR IR/CATH LAB
INTRA_ARTERIAL | Status: AC
  Filled 2015-11-21: qty 10

## 2015-11-21 MED ORDER — FENTANYL CITRATE (PF) 100 MCG/2ML IJ SOLN
50.0000 ug | Freq: Once | INTRAMUSCULAR | Status: AC
  Administered 2015-11-21: 50 ug via INTRAVENOUS

## 2015-11-21 MED ORDER — CLOPIDOGREL BISULFATE 75 MG PO TABS
300.0000 mg | ORAL_TABLET | Freq: Once | ORAL | Status: AC
  Administered 2015-11-21: 300 mg via ORAL
  Filled 2015-11-21: qty 4

## 2015-11-21 MED ORDER — INSULIN ASPART 100 UNIT/ML ~~LOC~~ SOLN
2.0000 [IU] | SUBCUTANEOUS | Status: DC
  Administered 2015-11-21 (×2): 4 [IU] via SUBCUTANEOUS
  Administered 2015-11-22 – 2015-11-25 (×4): 2 [IU] via SUBCUTANEOUS
  Filled 2015-11-21: qty 1

## 2015-11-21 MED ORDER — ENOXAPARIN SODIUM 40 MG/0.4ML ~~LOC~~ SOLN
40.0000 mg | SUBCUTANEOUS | Status: DC
  Administered 2015-11-22 – 2015-11-23 (×2): 40 mg via SUBCUTANEOUS
  Filled 2015-11-21 (×2): qty 0.4

## 2015-11-21 MED ORDER — PROPOFOL 1000 MG/100ML IV EMUL
0.0000 ug/kg/min | INTRAVENOUS | Status: DC
  Administered 2015-11-21: 20 ug/kg/min via INTRAVENOUS
  Administered 2015-11-21: 25 ug/kg/min via INTRAVENOUS
  Administered 2015-11-21: 20 ug/kg/min via INTRAVENOUS
  Administered 2015-11-21: 45 ug/kg/min via INTRAVENOUS
  Administered 2015-11-22 (×3): 50 ug/kg/min via INTRAVENOUS
  Filled 2015-11-21 (×4): qty 100

## 2015-11-21 MED ORDER — ASPIRIN 300 MG RE SUPP
300.0000 mg | RECTAL | Status: AC
  Administered 2015-11-21: 300 mg via RECTAL

## 2015-11-21 MED ORDER — FAMOTIDINE IN NACL 20-0.9 MG/50ML-% IV SOLN
20.0000 mg | Freq: Two times a day (BID) | INTRAVENOUS | Status: DC
Start: 2015-11-21 — End: 2015-11-24
  Administered 2015-11-21 – 2015-11-24 (×6): 20 mg via INTRAVENOUS
  Filled 2015-11-21 (×6): qty 50

## 2015-11-21 MED ORDER — SODIUM CHLORIDE 0.9 % IJ SOLN
3.0000 mL | Freq: Two times a day (BID) | INTRAMUSCULAR | Status: DC
Start: 2015-11-22 — End: 2015-11-30
  Administered 2015-11-22 – 2015-11-29 (×12): 3 mL via INTRAVENOUS

## 2015-11-21 MED ORDER — SODIUM CHLORIDE 0.9 % IV SOLN
2000.0000 mL | Freq: Once | INTRAVENOUS | Status: AC
  Administered 2015-11-21: 2000 mL via INTRAVENOUS

## 2015-11-21 MED ORDER — NITROGLYCERIN 1 MG/10 ML FOR IR/CATH LAB
INTRA_ARTERIAL | Status: DC | PRN
  Administered 2015-11-21: 21:00:00

## 2015-11-21 MED ORDER — PROPOFOL 1000 MG/100ML IV EMUL
INTRAVENOUS | Status: AC
  Filled 2015-11-21: qty 100

## 2015-11-21 MED ORDER — ASPIRIN 81 MG PO CHEW
81.0000 mg | CHEWABLE_TABLET | Freq: Every day | ORAL | Status: DC
  Administered 2015-11-22 – 2015-11-25 (×4): 81 mg via ORAL
  Filled 2015-11-21 (×4): qty 1

## 2015-11-21 MED ORDER — FENTANYL CITRATE (PF) 100 MCG/2ML IJ SOLN
100.0000 ug | INTRAMUSCULAR | Status: DC | PRN
  Administered 2015-11-22: 100 ug via INTRAVENOUS
  Filled 2015-11-21 (×2): qty 2

## 2015-11-21 MED ORDER — POTASSIUM CHLORIDE 20 MEQ/15ML (10%) PO SOLN
40.0000 meq | ORAL | Status: AC
  Administered 2015-11-21 (×2): 40 meq
  Filled 2015-11-21 (×2): qty 30

## 2015-11-21 MED ORDER — HEPARIN (PORCINE) IN NACL 2-0.9 UNIT/ML-% IJ SOLN
INTRAMUSCULAR | Status: DC | PRN
  Administered 2015-11-21: 21:00:00 via INTRA_ARTERIAL

## 2015-11-21 MED ORDER — HEPARIN SODIUM (PORCINE) 5000 UNIT/ML IJ SOLN
4000.0000 [IU] | Freq: Once | INTRAMUSCULAR | Status: AC
  Administered 2015-11-21: 4000 [IU] via INTRAVENOUS
  Filled 2015-11-21: qty 1

## 2015-11-21 MED ORDER — ACETAMINOPHEN 325 MG PO TABS
650.0000 mg | ORAL_TABLET | ORAL | Status: DC | PRN
  Administered 2015-11-23 – 2015-11-24 (×3): 650 mg via ORAL
  Filled 2015-11-21 (×5): qty 2

## 2015-11-21 MED ORDER — MIDAZOLAM HCL 2 MG/2ML IJ SOLN
INTRAMUSCULAR | Status: AC
  Filled 2015-11-21: qty 2

## 2015-11-21 MED ORDER — PROPOFOL 1000 MG/100ML IV EMUL
5.0000 ug/kg/min | INTRAVENOUS | Status: DC
Start: 2015-11-21 — End: 2015-11-22
  Administered 2015-11-22: 50 ug/kg/min via INTRAVENOUS

## 2015-11-21 MED ORDER — ASPIRIN 81 MG PO CHEW: 324.0000 mg | CHEWABLE_TABLET | ORAL | Status: AC

## 2015-11-21 MED ORDER — IOHEXOL 350 MG/ML SOLN
INTRAVENOUS | Status: DC | PRN
  Administered 2015-11-21: 100 mL via INTRA_ARTERIAL

## 2015-11-21 MED ORDER — BIVALIRUDIN BOLUS VIA INFUSION - CUPID: INTRAVENOUS | Status: DC | PRN

## 2015-11-21 MED ORDER — CLOPIDOGREL BISULFATE 75 MG PO TABS
75.0000 mg | ORAL_TABLET | Freq: Every day | ORAL | Status: DC
  Administered 2015-11-22 – 2015-11-28 (×7): 75 mg via ORAL
  Filled 2015-11-21 (×7): qty 1

## 2015-11-21 MED ORDER — MIDAZOLAM HCL 2 MG/2ML IJ SOLN
INTRAMUSCULAR | Status: DC | PRN
  Administered 2015-11-21: 2 mg via INTRAVENOUS

## 2015-11-21 MED ORDER — HEPARIN (PORCINE) IN NACL 2-0.9 UNIT/ML-% IJ SOLN
INTRAMUSCULAR | Status: AC
  Filled 2015-11-21: qty 1000

## 2015-11-21 MED ORDER — SODIUM CHLORIDE 0.9 % IV SOLN: 250.0000 mg | INTRAVENOUS | Status: DC | PRN

## 2015-11-21 MED ORDER — SODIUM CHLORIDE 0.9 % IJ SOLN: 3.0000 mL | INTRAMUSCULAR | Status: DC | PRN

## 2015-11-21 MED ORDER — FENTANYL CITRATE (PF) 100 MCG/2ML IJ SOLN
INTRAMUSCULAR | Status: AC
  Administered 2015-11-21: 50 ug
  Filled 2015-11-21: qty 2

## 2015-11-21 MED ORDER — LACTATED RINGERS IV BOLUS (SEPSIS)
1000.0000 mL | Freq: Once | INTRAVENOUS | Status: AC
  Administered 2015-11-21: 1000 mL via INTRAVENOUS

## 2015-11-21 MED ORDER — LIDOCAINE HCL (PF) 1 % IJ SOLN
INTRAMUSCULAR | Status: AC
  Filled 2015-11-21: qty 30

## 2015-11-21 MED ORDER — CHLORHEXIDINE GLUCONATE 0.12% ORAL RINSE (MEDLINE KIT)
15.0000 mL | Freq: Two times a day (BID) | OROMUCOSAL | Status: DC
  Administered 2015-11-21 – 2015-11-23 (×4): 15 mL via OROMUCOSAL

## 2015-11-21 SURGICAL SUPPLY — 10 items
CATH INFINITI 5FR ANG PIGTAIL (CATHETERS) ×2 IMPLANT
CATH OPTITORQUE JACKY 4.0 5F (CATHETERS) ×2 IMPLANT
DEVICE RAD COMP TR BAND LRG (VASCULAR PRODUCTS) ×2 IMPLANT
GLIDESHEATH SLEND SS 6F .021 (SHEATH) ×2 IMPLANT
KIT HEART LEFT (KITS) ×2 IMPLANT
PACK CARDIAC CATHETERIZATION (CUSTOM PROCEDURE TRAY) ×2 IMPLANT
SYR MEDRAD MARK V 150ML (SYRINGE) ×2 IMPLANT
TRANSDUCER W/STOPCOCK (MISCELLANEOUS) ×2 IMPLANT
TUBING CIL FLEX 10 FLL-RA (TUBING) ×2 IMPLANT
WIRE SAFE-T 1.5MM-J .035X260CM (WIRE) ×2 IMPLANT

## 2015-11-21 NOTE — ED Notes (Signed)
Ice packs applied, anticipate cooling

## 2015-11-21 NOTE — Progress Notes (Signed)
RT increased RR to 20 per MD verbal order and ABG results. RT will continue to monitor.

## 2015-11-21 NOTE — ED Notes (Signed)
Transferred patient to cath lab with RT, RR RN, and Dr. Fletcher Anon

## 2015-11-21 NOTE — ED Notes (Signed)
[place] from [place], pt arrested while on the stage. CPR was stared by bystanders immediately and initiated by fire at 0620. ROSC achieved at 0635. Initial rhythm VFIB. Fire shocked pt x2 with AED, shocked x2 by EMS. Pt received 1 epi by EMS, '300MG'$  amnioderone, 25 0 mcg fentanyl and '5mg'$  versed for intubation. Pt arrives with 7.5 ETT in place.  CBG 160. IO placed in L tibia, 20G R AC. Cold saline infusing.

## 2015-11-21 NOTE — Plan of Care (Signed)
On Call Cardiology   Code STEMI at 1927 in the ER. Information obtained from ER provider. Brought by EMS[place] Bystander CPR. AED 3 shocks. Amio and Epi. ROSC after 15 minutes. Intubated by EMS. Seen in ER. Unresponsive. SBP in 180s. HR 70's NSR. Intubated. Asked to give Heparin bolus IV and ASA via NG tube. Epi infusion.  ECG 1 probable AIVR at 71 bpm. ECG 2 NSR with nonspecific ST-T changes. ECG 3 in ER NSR. Strong femoral pulses. Bedside echo global hypokinesis.    Will discuss with interventional cardiology.   History of HTN and HLD.   Daniel Mannan, MD

## 2015-11-21 NOTE — Procedures (Signed)
Central Venous Catheter Insertion Procedure Note Daniel Richard [id number] [birth year]  Procedure: Insertion of Central Venous Catheter Indications: Assessment of intravascular volume, Drug and/or fluid administration and Frequent blood sampling  Procedure Details Consent: Risks of procedure as well as the alternatives and risks of each were explained to the (patient/caregiver).  Consent for procedure obtained. Time Out: Verified patient identification, verified procedure, site/side was marked, verified correct patient position, special equipment/implants available, medications/allergies/relevent history reviewed, required imaging and test results available.  Performed  Maximum sterile technique was used including antiseptics, cap, gloves, gown, hand hygiene, mask and sheet. Skin prep: Chlorhexidine; local anesthetic administered A antimicrobial bonded/coated triple lumen catheter was placed in the right internal jugular vein using the Seldinger technique.  Evaluation Blood flow good Complications: No apparent complications Patient did tolerate procedure well. Chest X-ray ordered to verify placement.  CXR: pending.  Daniel Richard 11/21/2015, 10:22 PM

## 2015-11-21 NOTE — Progress Notes (Signed)
Patient arrived by EMS. ETT tube already in place. CXR showed ETT tube in correct position. RT will continue to monitor. ABG pending

## 2015-11-21 NOTE — ED Notes (Signed)
Order for restraints placed by critical care BUT WERE [place] ED.

## 2015-11-21 NOTE — ED Notes (Signed)
Paged Code STEMI @ [phone]

## 2015-11-21 NOTE — ED Provider Notes (Signed)
CSN: [id number]     Arrival date & time 11/21/15  1918 History   First MD Initiated Contact with Patient 11/21/15 1918     No chief complaint on file.    (Consider location/radiation/quality/duration/timing/severity/associated sxs/prior Treatment) HPI  Patient is a 59 year old male with unknown past medical history, who presents to the emergency department post arrest. Patient was at church when he collapsed. Bystander CPR performed. When fire arrived, AED delivered 3 shocks.  when EMS arrived patient was in V. fib. He received another shock. Received 1 round of epinephrine. ROSC was achieved within 15 minutes. Patient was started on an Epi gtt at unknown rate. Patient was intubated in the field. EMS reports that patient has started to bite at the tube, spontaneous breathing, some purposeful movements. Per the patient's wife arrived after the patient he has been having 3 days of left-sided arm pain. She is uncertain if he had any chest pain prior to collapsing.  Past Medical History  Diagnosis Date  . ALLERGIC RHINITIS   . ANXIETY   . LOW BACK PAIN   . HYPERTENSION   . HYPERLIPIDEMIA    Past Surgical History  Procedure Laterality Date  . Cardiac catheterization N/A 11/21/2015    Procedure: Left Heart Cath and Coronary Angiography;  Surgeon: Wellington Hampshire, MD;  Location: [facility];  Service: Cardiovascular;  Laterality: N/A;   Family History  Problem Relation Age of Onset  . Hypertension Other    Social History  Substance Use Topics  . Smoking status: Former Smoker    Quit date: 02/08/2013  . Smokeless tobacco: None  . Alcohol Use: None    Review of Systems  Unable to perform ROS: Patient unresponsive      Allergies  Lipitor and Meperidine hcl  Home Medications   Prior to Admission medications   Medication Sig Start Date End Date Taking? Authorizing Provider  amLODipine (NORVASC) 2.5 MG tablet Take 1 tablet (2.5 mg total) by mouth daily. 06/18/15   Biagio Borg, MD  aspirin 81 MG EC tablet Take 1 tablet (81 mg total) by mouth daily. Swallow whole. 02/21/13   Biagio Borg, MD  fexofenadine (ALLEGRA) 180 MG tablet Take 1 tablet (180 mg total) by mouth daily. 10/24/13   Biagio Borg, MD  indomethacin (INDOCIN) 50 MG capsule Take 1 capsule (50 mg total) by mouth 3 (three) times daily with meals. 03/18/14   Aleksei Plotnikov V, MD  simvastatin (ZOCOR) 20 MG tablet Take 1 tablet (20 mg total) by mouth at bedtime. 06/18/15   Biagio Borg, MD  tiZANidine (ZANAFLEX) 4 MG tablet Take 1 tablet (4 mg total) by mouth every 6 (six) hours as needed for muscle spasms. 10/15/14   Biagio Borg, MD  traMADol (ULTRAM) 50 MG tablet Take 1-2 tablets (50-100 mg total) by mouth 2 (two) times daily as needed. 03/18/14   Aleksei Plotnikov V, MD  triamcinolone (KENALOG) 0.1 % paste Use as directed 1 application in the mouth or throat 2 (two) times daily. 01/30/14   Biagio Borg, MD   BP 106/65 mmHg  Pulse 76  Temp(Src) 99.9 F (37.7 C) (Core (Comment))  Resp 20  Ht '5\' 11"'$  (1.803 m)  Wt 82.9 kg  BMI 25.50 kg/m2  SpO2 100% Physical Exam  Constitutional: He is sedated and intubated.  HENT:  Head: Atraumatic.  Mouth/Throat: Oropharynx is clear and moist.  Eyes: Conjunctivae are normal. Pupils are equal, round, and reactive to light.  Neck:  No JVD present. No tracheal deviation present.  Cardiovascular: Normal rate, regular rhythm, normal heart sounds and intact distal pulses.   No murmur heard. Pulmonary/Chest: Effort normal and breath sounds normal. He is intubated. He has no wheezes. He has no rales.  ETT in place  Abdominal: Soft. He exhibits no distension. There is no tenderness.  Musculoskeletal: Normal range of motion.  Neurological:  Neurologic exam limited d/t sedation. Patient with purposeful movements to pull at ETT. Strength 5/5 in all extremities.   Skin: Skin is warm.  Psychiatric: He has a normal mood and affect.  Vitals reviewed.   ED Course  Procedures  (including critical care time) Labs Review Labs Reviewed  BASIC METABOLIC PANEL - Abnormal; Notable for the following:    CO2 17 (*)    Glucose, Bld 232 (*)    Creatinine, Ser 1.38 (*)    Calcium 8.3 (*)    GFR calc non Af Amer 55 (*)    Anion gap 18 (*)    All other components within normal limits  CBC - Abnormal; Notable for the following:    WBC 20.8 (*)    All other components within normal limits  PROTIME-INR - Abnormal; Notable for the following:    Prothrombin Time 15.4 (*)    All other components within normal limits  TRIGLYCERIDES - Abnormal; Notable for the following:    Triglycerides 182 (*)    All other components within normal limits  CBC - Abnormal; Notable for the following:    WBC 13.3 (*)    RBC 3.75 (*)    Hemoglobin 11.6 (*)    HCT 36.1 (*)    All other components within normal limits  BASIC METABOLIC PANEL - Abnormal; Notable for the following:    CO2 21 (*)    Glucose, Bld 138 (*)    Calcium 7.9 (*)    All other components within normal limits  MAGNESIUM - Abnormal; Notable for the following:    Magnesium 2.5 (*)    All other components within normal limits  LACTIC ACID, PLASMA - Abnormal; Notable for the following:    Lactic Acid, Venous 4.0 (*)    All other components within normal limits  GLUCOSE, CAPILLARY - Abnormal; Notable for the following:    Glucose-Capillary 168 (*)    All other components within normal limits  BASIC METABOLIC PANEL - Abnormal; Notable for the following:    Potassium 3.2 (*)    CO2 19 (*)    Glucose, Bld 148 (*)    Calcium 7.3 (*)    All other components within normal limits  BASIC METABOLIC PANEL - Abnormal; Notable for the following:    Chloride 112 (*)    Calcium 7.9 (*)    All other components within normal limits  GLUCOSE, CAPILLARY - Abnormal; Notable for the following:    Glucose-Capillary 136 (*)    All other components within normal limits  BLOOD GAS, ARTERIAL - Abnormal; Notable for the following:    pCO2  arterial 29.4 (*)    pO2, Arterial 152 (*)    Bicarbonate 19.8 (*)    Acid-base deficit 3.6 (*)    All other components within normal limits  GLUCOSE, CAPILLARY - Abnormal; Notable for the following:    Glucose-Capillary 101 (*)    All other components within normal limits  I-STAT CHEM 8, ED - Abnormal; Notable for the following:    Potassium 2.7 (*)    Glucose, Bld 233 (*)  Calcium, Ion 1.08 (*)    All other components within normal limits  I-STAT CG4 LACTIC ACID, ED - Abnormal; Notable for the following:    Lactic Acid, Venous 9.44 (*)    All other components within normal limits  I-STAT TROPOININ, ED - Abnormal; Notable for the following:    Troponin i, poc 0.11 (*)    All other components within normal limits  CBG MONITORING, ED - Abnormal; Notable for the following:    Glucose-Capillary 185 (*)    All other components within normal limits  I-STAT ARTERIAL BLOOD GAS, ED - Abnormal; Notable for the following:    pH, Arterial 7.203 (*)    pCO2 arterial 50.4 (*)    pO2, Arterial 112.0 (*)    Acid-base deficit 8.0 (*)    All other components within normal limits  MRSA PCR SCREENING  URINE CULTURE  CULTURE, BLOOD (ROUTINE X 2)  CULTURE, BLOOD (ROUTINE X 2)  APTT  MAGNESIUM  PHOSPHORUS  MAGNESIUM  PHOSPHORUS  GLUCOSE, CAPILLARY  GLUCOSE, CAPILLARY  LACTIC ACID, PLASMA  CBC  BASIC METABOLIC PANEL  MAGNESIUM  PHOSPHORUS  BLOOD GAS, ARTERIAL  I-STAT CG4 LACTIC ACID, ED  Randolm Idol, ED  POCT ACTIVATED CLOTTING TIME    Imaging Review Dg Chest Port 1 View  11/22/2015  CLINICAL DATA:  Fever and respiratory failure. EXAM: PORTABLE CHEST 1 VIEW COMPARISON:  11/21/2015 FINDINGS: Mild bibasilar atelectasis present. Endotracheal tube tip is approximately 3 cm above the carina. Right jugular central line tip stable at the cavoatrial junction. A nasogastric tube extends into the stomach. Temporary pacing pad overlies the left chest. No evidence of pulmonary edema, focal  airspace consolidation, pneumothorax or visualized pleural fluid. The heart size is normal. IMPRESSION: Mild bibasilar atelectasis. Electronically Signed   By: Aletta Edouard M.D.   On: 11/22/2015 18:40   Dg Chest Port 1 View  11/21/2015  CLINICAL DATA:  Central venous catheter placement. Initial encounter. EXAM: PORTABLE CHEST 1 VIEW COMPARISON:  Chest radiograph performed earlier today at 7:32 p.m. FINDINGS: The patient's right IJ line is noted ending about the distal SVC. An enteric tube is noted extending below the diaphragm. The patient's endotracheal tube is seen ending 4 cm above the carina. Mild vascular congestion is noted. Mild bilateral atelectasis is seen. No pleural effusion or pneumothorax is identified. The cardiomediastinal silhouette is borderline normal in size. No acute osseous abnormalities are identified. External pacing pads are noted. IMPRESSION: 1. Right IJ line noted ending about the distal SVC. 2. Endotracheal tube seen ending 4 cm above the carina. 3. Mild vascular congestion noted.  Mild bilateral atelectasis seen. Electronically Signed   By: Garald Balding M.D.   On: 11/21/2015 22:30   Dg Chest Portable 1 View  11/21/2015  CLINICAL DATA:  Post CPR and intubation. EXAM: PORTABLE CHEST - 1 VIEW COMPARISON:  Two-view chest x-ray 11/27/2005 FINDINGS: The heart size is exaggerated by low lung volumes. Diffuse interstitial and airspace opacities are noted in the upper lung fields bilaterally, right greater than left. Atherosclerotic calcifications are present in the aorta. The patient is intubated. The endotracheal tube terminates 2.5 cm above the carina and could be pulled back 1-2 cm for more optimal positioning. Pacing pads are in place. IMPRESSION: 1. Borderline cardiomegaly with diffuse interstitial and airspace opacities suggesting edema. Infection or aspiration is considered less likely. 2. The endotracheal tube terminates 2.5 cm from the carina and could be pulled back 1-2 cm  for more optimal positioning. 3. Atherosclerosis. Electronically  Signed   By: San Morelle M.D.   On: 11/21/2015 19:47   I have personally reviewed and evaluated these images and lab results as part of my medical decision-making.   EKG Interpretation   Date/Time:  Sunday November 21 2015 19:20:33 EST Ventricular Rate:  80 PR Interval:  156 QRS Duration: 126 QT Interval:  370 QTC Calculation: 427 R Axis:   62 Text Interpretation:  Sinus rhythm Probable left atrial enlargement IVCD,  consider atypical LBBB Baseline wander in lead(s) V3 ED PHYSICIAN  INTERPRETATION AVAILABLE IN [facility] Confirmed by TEST, Record  ([id number]) on 11/22/2015 6:51:55 AM      MDM   Final diagnoses:  Cardiac arrest [facility])    patient is a 59 year old male with past medical history significant for hypertension and hyperlipidemia, who presents to the emergency department post arrest. AED delivered 3 shocks, epinephrine 1, initial rhythm of V. fib when EMS arrived. He received another shock and started on the drip. ROSC after 15 minutes. On arrival patient is intubated, spontaneous respirations, localizing to pain. Initial BP 88/55, HR 68, SpO2 100%.   Critical care and cardiology consulted immediately upon arrival of the patient. Patient's epi drip was continued. EKG showed diffuse ST depression in anterior leads. Cath Lab was activated. Patient became hypertensive, epinephrine was titrated down. Started on propofol for sedation. Chest x-ray showed pulmonary edema, endotracheal tube in place. After discussion with the critical care, it was decided that we would not start hypothermia protocol on the patient since he is having purposeful movements. Was taken directly to the cath lab from the emergency department. Transferred in stable condition. Patient's family updated.     Nathaniel Man, MD 11/23/15 0134  Davonna Belling, MD 11/24/15 [phone]

## 2015-11-21 NOTE — Progress Notes (Signed)
Utilization review completed.  

## 2015-11-21 NOTE — ED Notes (Signed)
Paged STEMI doctor @ [phone]

## 2015-11-21 NOTE — H&P (Signed)
PULMONARY / CRITICAL CARE MEDICINE   Name: Daniel Richard MRN: [id number] DOB: 1956-08-23    ADMISSION DATE:  11/21/2015 CONSULTATION DATE:  11/21/15  REFERRING MD:  Alvino Chapel  CHIEF COMPLAINT:  S/p cardiac arrest, vtach   HISTORY OF PRESENT ILLNESS:   59yo caucasian male collapsed while at church. Bystander CPR. AED delivered 3 shocks. Epinephrine x 1. Achieved ROSC within 15 minutes approximately. Upon arrival to ED, patient was on epinephrine drip. He was assessed by cardiology and the cath lab team was called in.   PAST MEDICAL HISTORY :  He  has a past medical history of ALLERGIC RHINITIS; ANXIETY; LOW BACK PAIN; HYPERTENSION; and HYPERLIPIDEMIA.  PAST SURGICAL HISTORY: He  has no past surgical history on file.  Allergies  Allergen Reactions  . Lipitor [Atorvastatin]     myalgia  . Meperidine Hcl     No current facility-administered medications on file prior to encounter.   Current Outpatient Prescriptions on File Prior to Encounter  Medication Sig  . amLODipine (NORVASC) 2.5 MG tablet Take 1 tablet (2.5 mg total) by mouth daily.  Marland Kitchen aspirin 81 MG EC tablet Take 1 tablet (81 mg total) by mouth daily. Swallow whole.  . fexofenadine (ALLEGRA) 180 MG tablet Take 1 tablet (180 mg total) by mouth daily.  . fluticasone (FLONASE) 50 MCG/ACT nasal spray Place 2 sprays into both nostrils daily.  . indomethacin (INDOCIN) 50 MG capsule Take 1 capsule (50 mg total) by mouth 3 (three) times daily with meals.  . simvastatin (ZOCOR) 20 MG tablet Take 1 tablet (20 mg total) by mouth at bedtime.  Marland Kitchen tiZANidine (ZANAFLEX) 4 MG tablet Take 1 tablet (4 mg total) by mouth every 6 (six) hours as needed for muscle spasms.  . traMADol (ULTRAM) 50 MG tablet Take 1-2 tablets (50-100 mg total) by mouth 2 (two) times daily as needed.  . triamcinolone (KENALOG) 0.1 % paste Use as directed 1 application in the mouth or throat 2 (two) times daily.    FAMILY HISTORY:  His has no family status  information on file.   SOCIAL HISTORY: He  reports that he quit smoking about 2 years ago. He does not have any smokeless tobacco history on file. He reports that he does not use illicit drugs.  REVIEW OF SYSTEMS:   Unable to obtain 2/2 mental status, intubation  SUBJECTIVE:  Unable to obtain   VITAL SIGNS: There were no vitals taken for this visit.  HEMODYNAMICS:    VENTILATOR SETTINGS:    INTAKE / OUTPUT:    PHYSICAL EXAMINATION: General:  59yo CM who appears his stated age and is currently intubated and sedated. He appears well nourished and well developed  Neuro: Neuro exam limited 2/2 sedation . He did wake up from sedation and he was purposeful trying to get out of bed and trying to grab the ETT with both hands. He was restrained for safety. Strength was clearly 5/5 on all extremities as the patient required 4 people to hold him down.  HEENT: Head, normocephalic, atraumatic. Ears symmetric, permeable. Eyes: no conjunctival icterus, no erythema. Nose: permeable, midline septum, ETT in place Cardiovascular: S1S2 RRR no murmurs, rubs, or gallops auscultated. No thrills palpated.  Lungs:  Chest symmetrical with respirations, some scattered crackles noted.  Abdomen:  Soft, nontender, no guarding, nondistended. Present bowel sounds. Musculoskeletal:  No muscle atrophy noted nor weakness. No swelling nor tenderness of the joints.  Skin:  No notable scars, rashes, cruises. No bed sores noted.  Lymphatics:  no palpable lymphadenopathy of cervical, supraclvicular nor inguinal areas.     LABS:  BMET No results for input(s): NA, K, CL, CO2, BUN, CREATININE, GLUCOSE in the last 168 hours.  Electrolytes No results for input(s): CALCIUM, MG, PHOS in the last 168 hours.  CBC  Recent Labs Lab 11/21/15 1923 11/21/15 1942  WBC 20.8*  --   HGB 14.5 16.7  HCT 44.4 49.0  PLT 258  --     Coag's  Recent Labs Lab 11/21/15 1923  APTT 32  INR 1.20    Sepsis  Markers  Recent Labs Lab 11/21/15 1943  LATICACIDVEN 9.44*    ABG  Recent Labs Lab 11/21/15 2004  PHART 7.203*  PCO2ART 50.4*  PO2ART 112.0*    Liver Enzymes No results for input(s): AST, ALT, ALKPHOS, BILITOT, ALBUMIN in the last 168 hours.  Cardiac Enzymes No results for input(s): TROPONINI, PROBNP in the last 168 hours.  Glucose  Recent Labs Lab 11/21/15 2001  GLUCAP 185*    Imaging Dg Chest Portable 1 View  11/21/2015  CLINICAL DATA:  Post CPR and intubation. EXAM: PORTABLE CHEST - 1 VIEW COMPARISON:  Two-view chest x-ray 11/27/2005 FINDINGS: The heart size is exaggerated by low lung volumes. Diffuse interstitial and airspace opacities are noted in the upper lung fields bilaterally, right greater than left. Atherosclerotic calcifications are present in the aorta. The patient is intubated. The endotracheal tube terminates 2.5 cm above the carina and could be pulled back 1-2 cm for more optimal positioning. Pacing pads are in place. IMPRESSION: 1. Borderline cardiomegaly with diffuse interstitial and airspace opacities suggesting edema. Infection or aspiration is considered less likely. 2. The endotracheal tube terminates 2.5 cm from the carina and could be pulled back 1-2 cm for more optimal positioning. 3. Atherosclerosis. Electronically Signed   By: San Morelle M.D.   On: 11/21/2015 19:47     STUDIES:  CXR and EKG reviewed  CULTURES: n/a  ANTIBIOTICS: n/a  SIGNIFICANT EVENTS: Patient going to cath lab  LINES/TUBES: PIV  DISCUSSION: 59yo CM s/p cardiac arrest with 15 minute downtime, appears to be purposeful in ED although combative, cath lab called in.   ASSESSMENT / PLAN:  PULMONARY A: Acute hypoxemic respiratory failure s/p cardiac arrest, some hypercapnia on ABG P:  Vent management. Titrate PEEP/FiO2, RR and TV   CARDIOVASCULAR A: Cardiogenic shock on epinephrine s/p cardiac arrest, witnessed, w/15 minute downtime, bystander CPR and 4  AED shocks Original ECG with findings of diffuse ST depression on precordial leads   P: cardiology notified. Cath lab activated. Titrate epinephrine   RENAL A:  AKI 2/2 cardiac arrest/ATN, BL creat 1.20 Hypokalemia, 2.7  Lactic acidosis  P:  Trend lactate, replace K, Mg   GASTROINTESTINAL A:  No active issues  P: gi prophylaxis with famotidine    HEMATOLOGIC A:  Leukocytosis, likely reactive  P:  monitor  INFECTIOUS A:   No active issues  P:     ENDOCRINE A:  Hyperglycemia, potentially reactive    P:  Started SSI   NEUROLOGIC A:  Toxic metabolic encephalopathy. Patient was aware and purposefully attempting to get out of the bed and grab his endotracheal tube   P:  Sedation for procedure with fentanyl and propofol, titrate sedation.  RASS goal: 0  Critical care time spent titrating vent, pressors, sedation, discussions with family and consultants 50 minutes.   FAMILY  - Updates: care discussed with spouse and daughter who were at bedside  - Inter-disciplinary family meet or  Palliative Care meeting due by:  day [place], MD [place]  [facility] [place] Pager: [phone]  11/21/2015, 7:19 PM  Addendum: after placement of the central line, patient was following two step commands and nodding to questions.

## 2015-11-21 NOTE — Progress Notes (Signed)
Chaplain responded to CPR in progress transported to [place].  Met family and pastor in ED waiting area. Oriented family to ED and later to Cath Lab waiting area.  Shared in prayer led by pastor and provided emotional and spiritual support. Facilitated visits to bedside and escorted family to Cath Lab waiting area. Wife Izora Gala) and daughter were fairly emotional and in shock.  Wife was distraught over the fact that patient had suffered significant pain in his arm and chest in the previous days and indicated that his BP was exceedingly high ("through the roof") but he refused to get it checked out despite her requests that he do that.  Family has significant pastoral and church support; however, do not hesitate to page spiritual care if family requests it or further support is indicated.    Luana Shu [phone]   11/21/15 2024  Clinical Encounter Type  Visited With Family  Visit Type Initial;Critical Care;Spiritual support  Referral From Nurse  Consult/Referral To Chaplain  Spiritual Encounters  Spiritual Needs Prayer;Emotional  Stress Factors  Family Stress Factors Loss of control;Lack of knowledge;Health changes

## 2015-11-22 ENCOUNTER — Inpatient Hospital Stay (HOSPITAL_COMMUNITY): Payer: BLUE CROSS/BLUE SHIELD

## 2015-11-22 ENCOUNTER — Encounter (HOSPITAL_COMMUNITY): Payer: Self-pay | Admitting: Cardiovascular Disease

## 2015-11-22 ENCOUNTER — Ambulatory Visit (HOSPITAL_COMMUNITY): Payer: BLUE CROSS/BLUE SHIELD

## 2015-11-22 DIAGNOSIS — I25119 Atherosclerotic heart disease of native coronary artery with unspecified angina pectoris: Secondary | ICD-10-CM

## 2015-11-22 DIAGNOSIS — R570 Cardiogenic shock: Secondary | ICD-10-CM | POA: Diagnosis present

## 2015-11-22 DIAGNOSIS — G931 Anoxic brain damage, not elsewhere classified: Secondary | ICD-10-CM | POA: Diagnosis present

## 2015-11-22 DIAGNOSIS — J9601 Acute respiratory failure with hypoxia: Secondary | ICD-10-CM | POA: Diagnosis present

## 2015-11-22 DIAGNOSIS — R079 Chest pain, unspecified: Secondary | ICD-10-CM

## 2015-11-22 DIAGNOSIS — I214 Non-ST elevation (NSTEMI) myocardial infarction: Secondary | ICD-10-CM

## 2015-11-22 DIAGNOSIS — G9340 Encephalopathy, unspecified: Secondary | ICD-10-CM | POA: Diagnosis present

## 2015-11-22 DIAGNOSIS — I469 Cardiac arrest, cause unspecified: Secondary | ICD-10-CM

## 2015-11-22 LAB — BASIC METABOLIC PANEL
Anion gap: 11 (ref 5–15)
Anion gap: 5 (ref 5–15)
Anion gap: 8 (ref 5–15)
BUN: 14 mg/dL (ref 6–20)
BUN: 14 mg/dL (ref 6–20)
BUN: 14 mg/dL (ref 6–20)
CO2: 19 mmol/L — ABNORMAL LOW (ref 22–32)
CO2: 21 mmol/L — ABNORMAL LOW (ref 22–32)
CO2: 22 mmol/L (ref 22–32)
Calcium: 7.3 mg/dL — ABNORMAL LOW (ref 8.9–10.3)
Calcium: 7.9 mg/dL — ABNORMAL LOW (ref 8.9–10.3)
Calcium: 7.9 mg/dL — ABNORMAL LOW (ref 8.9–10.3)
Chloride: 110 mmol/L (ref 101–111)
Chloride: 111 mmol/L (ref 101–111)
Chloride: 112 mmol/L — ABNORMAL HIGH (ref 101–111)
Creatinine, Ser: 0.85 mg/dL (ref 0.61–1.24)
Creatinine, Ser: 0.95 mg/dL (ref 0.61–1.24)
Creatinine, Ser: 1.05 mg/dL (ref 0.61–1.24)
GFR calc Af Amer: >60 mL/min (ref >60)
GFR calc Af Amer: >60 mL/min (ref >60)
GFR calc Af Amer: >60 mL/min (ref >60)
GFR calc non Af Amer: >60 mL/min (ref >60)
GFR calc non Af Amer: >60 mL/min (ref >60)
GFR calc non Af Amer: >60 mL/min (ref >60)
Glucose, Bld: 138 mg/dL — ABNORMAL HIGH (ref 65–99)
Glucose, Bld: 148 mg/dL — ABNORMAL HIGH (ref 65–99)
Glucose, Bld: 95 mg/dL (ref 65–99)
Potassium: 3.2 mmol/L — ABNORMAL LOW (ref 3.5–5.1)
Potassium: 5 mmol/L (ref 3.5–5.1)
Potassium: 5.1 mmol/L (ref 3.5–5.1)
Sodium: 139 mmol/L (ref 135–145)
Sodium: 139 mmol/L (ref 135–145)
Sodium: 141 mmol/L (ref 135–145)

## 2015-11-22 LAB — BLOOD GAS, ARTERIAL
Acid-base deficit: 3.6 mmol/L — ABNORMAL HIGH (ref 0.0–2.0)
Bicarbonate: 19.8 mEq/L — ABNORMAL LOW (ref 20.0–24.0)
Drawn by: 36529
FIO2: 0.4
MECHVT: 600 mL
O2 Saturation: 99.2 %
PEEP: 5 cmH2O
Patient temperature: 98.6
RATE: 20 resp/min
TCO2: 20.7 mmol/L (ref 0–100)
pCO2 arterial: 29.4 mmHg — ABNORMAL LOW (ref 35.0–45.0)
pH, Arterial: 7.444 (ref 7.350–7.450)
pO2, Arterial: 152 mmHg — ABNORMAL HIGH (ref 80.0–100.0)

## 2015-11-22 LAB — CBC
HCT: 36.1 % — ABNORMAL LOW (ref 39.0–52.0)
Hemoglobin: 11.6 g/dL — ABNORMAL LOW (ref 13.0–17.0)
MCH: 30.9 pg (ref 26.0–34.0)
MCHC: 32.1 g/dL (ref 30.0–36.0)
MCV: 96.3 fL (ref 78.0–100.0)
Platelets: 193 10*3/uL (ref 150–400)
RBC: 3.75 MIL/uL — ABNORMAL LOW (ref 4.22–5.81)
RDW: 13.2 % (ref 11.5–15.5)
WBC: 13.3 10*3/uL — ABNORMAL HIGH (ref 4.0–10.5)

## 2015-11-22 LAB — POCT ACTIVATED CLOTTING TIME: Activated Clotting Time: 126 seconds

## 2015-11-22 LAB — GLUCOSE, CAPILLARY
Glucose-Capillary: 136 mg/dL — ABNORMAL HIGH (ref 65–99)
Glucose-Capillary: 87 mg/dL (ref 65–99)
Glucose-Capillary: 87 mg/dL (ref 65–99)
Glucose-Capillary: 101 mg/dL — ABNORMAL HIGH (ref 65–99)

## 2015-11-22 LAB — MAGNESIUM
Magnesium: 1.9 mg/dL (ref 1.7–2.4)
Magnesium: 2.3 mg/dL (ref 1.7–2.4)

## 2015-11-22 LAB — MRSA PCR SCREENING: MRSA by PCR: NEGATIVE

## 2015-11-22 LAB — PHOSPHORUS
Phosphorus: 2.6 mg/dL (ref 2.5–4.6)
Phosphorus: 2.9 mg/dL (ref 2.5–4.6)

## 2015-11-22 MED ORDER — ACETAMINOPHEN 160 MG/5ML PO SOLN
650.0000 mg | ORAL | Status: DC | PRN
  Administered 2015-11-22: 650 mg via ORAL
  Filled 2015-11-22: qty 20.3

## 2015-11-22 MED ORDER — POTASSIUM CHLORIDE 20 MEQ/15ML (10%) PO SOLN
40.0000 meq | Freq: Once | ORAL | Status: AC
  Administered 2015-11-22: 40 meq
  Filled 2015-11-22: qty 30

## 2015-11-22 MED ORDER — MIDAZOLAM HCL 5 MG/ML IJ SOLN
1.0000 mg/h | INTRAMUSCULAR | Status: DC
  Administered 2015-11-22: 1 mg/h via INTRAVENOUS
  Administered 2015-11-23: 2 mg/h via INTRAVENOUS
  Filled 2015-11-22 (×2): qty 10

## 2015-11-22 MED ORDER — VITAL AF 1.2 CAL PO LIQD
1000.0000 mL | ORAL | Status: DC
  Administered 2015-11-22 (×2): 1000 mL
  Filled 2015-11-22 (×4): qty 1000

## 2015-11-22 MED ORDER — SODIUM CHLORIDE 0.9 % IV SOLN
1.0000 g | Freq: Once | INTRAVENOUS | Status: AC
  Administered 2015-11-22: 1 g via INTRAVENOUS
  Filled 2015-11-22: qty 10

## 2015-11-22 MED ORDER — VANCOMYCIN HCL IN DEXTROSE 1-5 GM/200ML-% IV SOLN
1000.0000 mg | Freq: Three times a day (TID) | INTRAVENOUS | Status: DC
  Administered 2015-11-22 – 2015-11-24 (×5): 1000 mg via INTRAVENOUS
  Filled 2015-11-22 (×7): qty 200

## 2015-11-22 MED ORDER — PIPERACILLIN-TAZOBACTAM 3.375 G IVPB
3.3750 g | Freq: Three times a day (TID) | INTRAVENOUS | Status: DC
  Administered 2015-11-22 – 2015-11-25 (×9): 3.375 g via INTRAVENOUS
  Filled 2015-11-22 (×11): qty 50

## 2015-11-22 MED ORDER — CARVEDILOL 6.25 MG PO TABS
6.2500 mg | ORAL_TABLET | Freq: Two times a day (BID) | ORAL | Status: DC
  Administered 2015-11-22 – 2015-11-24 (×4): 6.25 mg via ORAL
  Filled 2015-11-22 (×5): qty 1

## 2015-11-22 MED ORDER — SODIUM CHLORIDE 0.9 % IV SOLN
25.0000 ug/h | INTRAVENOUS | Status: DC
  Administered 2015-11-22: 75 ug/h via INTRAVENOUS
  Administered 2015-11-22: 25 ug/h via INTRAVENOUS
  Administered 2015-11-22: 50 ug/h via INTRAVENOUS
  Filled 2015-11-22 (×2): qty 50

## 2015-11-22 MED ORDER — DEXTROSE 5 % IV SOLN
2.0000 ug/min | INTRAVENOUS | Status: DC
  Administered 2015-11-22: 5 ug/min via INTRAVENOUS
  Administered 2015-11-22: 2 ug/min via INTRAVENOUS
  Filled 2015-11-22: qty 4

## 2015-11-22 NOTE — Progress Notes (Signed)
[facility] Progress Note Patient Name: KYAN YURKOVICH DOB: 1956/10/10 MRN: [id number]   Date of Service  11/22/2015  HPI/Events of Note   Recent Labs Lab 11/21/15 1923 11/21/15 1942 11/22/15 0005  NA 140 142 141  K 3.5 2.7* 3.2*  CL 105 103 111  CO2 17*  --  19*  GLUCOSE 232* 233* 148*  BUN '14 18 14  '$ CREATININE 1.38* 1.20 1.05  CALCIUM 8.3*  --  7.3*  MG 2.5*  --  2.3  PHOS  --   --  2.6     eICU Interventions  reeplete k and calcium     Intervention Category Minor Interventions: Electrolytes abnormality - evaluation and management  Margie Brink 11/22/2015, 12:56 AM

## 2015-11-22 NOTE — Progress Notes (Signed)
Pt arrived to unit with one soft restraint to the left wrist. Restraint was removed upon arrival to unit. Pt is resting comfortably. Will continue to monitor.

## 2015-11-22 NOTE — Progress Notes (Signed)
[facility] Progress Note Patient Name: [name] DOB: 03-08-1956 MRN: [id number]   Date of Service  11/22/2015  HPI/Events of Note  Fever to 101.1 F. Not on Abx Rx. No CXR today.   eICU Interventions  Will order: 1. Blood Cultures X 2.  2. Portable CXR now. 3. Blood Cultures X 2.  4. Urine Culture now. 5. Vancomycin and Zosyn per pharmacy.      Intervention Category Major Interventions: Infection - evaluation and management  Sommer,Steven Eugene 11/22/2015, 5:12 PM

## 2015-11-22 NOTE — Progress Notes (Signed)
[facility] Progress Note Patient Name: Daniel Richard DOB: 1956-11-28 MRN: [id number]   Date of Service  11/22/2015  HPI/Events of Note   Recent Labs Lab 11/21/15 1923 11/21/15 1942 11/22/15 0005 11/22/15 0344  K 3.5 2.7* 3.2* 5.0     Recent Labs Lab 11/21/15 1923 11/21/15 1942 11/22/15 0005 11/22/15 0344  CREATININE 1.38* 1.20 1.05 0.95      eICU Interventions  k is now high normal  Plan Repeat bmet at 8am     Intervention Category Minor Interventions: Other:  Armond Cuthrell 11/22/2015, 5:14 AM

## 2015-11-22 NOTE — Progress Notes (Signed)
*  PRELIMINARY RESULTS* Echocardiogram 2D Echocardiogram has been performed.  Daniel Richard 11/22/2015, 12:39 PM

## 2015-11-22 NOTE — Progress Notes (Signed)
ANTIBIOTIC CONSULT NOTE - INITIAL  Pharmacy Consult for vancomycin and zosyn Indication: rule out sepsis  Allergies  Allergen Reactions  . Lipitor [Atorvastatin]     myalgia  . Meperidine Hcl     Patient Measurements: Height: '5\' 11"'$  (180.3 cm) Weight: 182 lb 12.2 oz (82.9 kg) IBW/kg (Calculated) : 75.3 Adjusted Body Weight:   Vital Signs: Temp: 101.1 F (38.4 C) (12/12 1700) Temp Source: Core (Comment) (12/12 1600) BP: 172/97 mmHg (12/12 1700) Pulse Rate: 90 (12/12 1700) Intake/Output from previous day: 12/11 0701 - 12/12 0700 In: 1263.8 [P.O.:625; I.V.:488.8; NG/GT:100; IV Piggyback:50] Out: 600 [Urine:600] Intake/Output from this shift: Total I/O In: 412.9 [I.V.:252.9; Other:40; NG/GT:70; IV Piggyback:50] Out: 955 [Urine:955]  Labs:  Recent Labs  11/21/15 1923 11/21/15 1942 11/22/15 0005 11/22/15 0344 11/22/15 0901  WBC 20.8*  --   --  13.3*  --   HGB 14.5 16.7  --  11.6*  --   PLT 258  --   --  193  --   CREATININE 1.38* 1.20 1.05 0.95 0.85   Estimated Creatinine Clearance: 100.9 mL/min (by C-G formula based on Cr of 0.85). No results for input(s): VANCOTROUGH, VANCOPEAK, VANCORANDOM, GENTTROUGH, GENTPEAK, GENTRANDOM, TOBRATROUGH, TOBRAPEAK, TOBRARND, AMIKACINPEAK, AMIKACINTROU, AMIKACIN in the last 72 hours.   Microbiology: Recent Results (from the past 720 hour(s))  MRSA PCR Screening     Status: None   Collection Time: 11/22/15  2:14 AM  Result Value Ref Range Status   MRSA by PCR NEGATIVE NEGATIVE Final    Comment:        The GeneXpert MRSA Assay (FDA approved for NASAL specimens only), is one component of a comprehensive MRSA colonization surveillance program. It is not intended to diagnose MRSA infection nor to guide or monitor treatment for MRSA infections.     Medical History: Past Medical History  Diagnosis Date  . ALLERGIC RHINITIS   . ANXIETY   . LOW BACK PAIN   . HYPERTENSION   . HYPERLIPIDEMIA     Medications:  Scheduled:   . antiseptic oral rinse  7 mL Mouth Rinse 10 times per day  . aspirin  81 mg Oral Daily  . carvedilol  6.25 mg Oral BID  . chlorhexidine gluconate  15 mL Mouth Rinse BID  . clopidogrel  75 mg Oral Q breakfast  . enoxaparin (LOVENOX) injection  40 mg Subcutaneous Q24H  . famotidine (PEPCID) IV  20 mg Intravenous Q12H  . insulin aspart  2-6 Units Subcutaneous 6 times per day  . sodium chloride  3 mL Intravenous Q12H   Infusions:  . epinephrine Stopped (11/22/15 0233)  . feeding supplement (VITAL AF 1.2 CAL) 1,000 mL (11/22/15 1631)  . fentaNYL infusion INTRAVENOUS 30 mcg/hr (11/22/15 1543)  . midazolam (VERSED) infusion 2 mg/hr (11/22/15 1245)  . norepinephrine (LEVOPHED) Adult infusion Stopped (11/22/15 0832)  . propofol (DIPRIVAN) infusion Stopped (11/22/15 1243)   Assessment: 59 yo male with sepsis will be started on vancomycin and zosyn.  CrCl > 100.   Goal of Therapy:  Vancomycin trough level 15-20 mcg/ml  Plan:  - zosyn 3.375g iv q8h (4h infusion) - vancomycin 1g iv q8h - monitor renal function and check vancomycin trough when it's appropriate  Hennessy Bartel, Tsz-Yin 11/22/2015,5:15 PM

## 2015-11-22 NOTE — Progress Notes (Signed)
CRITICAL VALUE ALERT  Critical value received:  Lactic Acid 4.0  Date of notification: 11/21/15  Time of notification:  2250  Critical value read back:Yes.    Nurse who received alert:  Ellsworth Lennox  MD notified :  Chase Caller  Time MD notified: [phone]

## 2015-11-22 NOTE — Progress Notes (Signed)
SUBJECTIVE:  Cathed last night.  Medical management only.  OBJECTIVE:   Vitals:   Filed Vitals:   11/22/15 0500 11/22/15 0600 11/22/15 0625 11/22/15 0743  BP: 123/84 132/96 171/94   Pulse: 64 72 85 72  Temp: 98.1 F (36.7 C) 98.4 F (36.9 C) 98.6 F (37 C) 99.1 F (37.3 C)  TempSrc:      Resp: '20 21 19 20  '$ Height:      Weight:      SpO2: 100% 100% 100% 100%   I&O's:   Intake/Output Summary (Last 24 hours) at 11/22/15 0946 Last data filed at 11/22/15 0600  Gross per 24 hour  Intake 1232.32 ml  Output    600 ml  Net 632.32 ml   TELEMETRY: Reviewed telemetry pt in NSR:     PHYSICAL EXAM General: Intubated , sedated Head:   Normal cephalic and atramatic  Lungs:  Coarse breath sounds bilaterally to auscultation. Heart:   HRRR S1 S2  No JVD.   Abdomen: abdomen soft and non-tender Msk:  Back normal,  Normal strength and tone for age. Extremities:   No edema.  2+ PT pulses bilaterally Neuro: Intubated Psych:  Intubated Skin: No rash   LABS: Basic Metabolic Panel:  Recent Labs  11/22/15 0005 11/22/15 0344 11/22/15 0901  NA 141 139 139  K 3.2* 5.0 5.1  CL 111 110 112*  CO2 19* 21* 22  GLUCOSE 148* 138* 95  BUN '14 14 14  '$ CREATININE 1.05 0.95 0.85  CALCIUM 7.3* 7.9* 7.9*  MG 2.3 1.9  --   PHOS 2.6 2.9  --    Liver Function Tests: No results for input(s): AST, ALT, ALKPHOS, BILITOT, PROT, ALBUMIN in the last 72 hours. No results for input(s): LIPASE, AMYLASE in the last 72 hours. CBC:  Recent Labs  11/21/15 1923 11/21/15 1942 11/22/15 0344  WBC 20.8*  --  13.3*  HGB 14.5 16.7 11.6*  HCT 44.4 49.0 36.1*  MCV 98.2  --  96.3  PLT 258  --  193   Cardiac Enzymes: No results for input(s): CKTOTAL, CKMB, CKMBINDEX, TROPONINI in the last 72 hours. BNP: Invalid input(s): POCBNP D-Dimer: No results for input(s): DDIMER in the last 72 hours. Hemoglobin A1C: No results for input(s): HGBA1C in the last 72 hours. Fasting Lipid Panel:  Recent Labs  11/21/15 1923  TRIG 182*   Thyroid Function Tests: No results for input(s): TSH, T4TOTAL, T3FREE, THYROIDAB in the last 72 hours.  Invalid input(s): FREET3 Anemia Panel: No results for input(s): VITAMINB12, FOLATE, FERRITIN, TIBC, IRON, RETICCTPCT in the last 72 hours. Coag Panel:   Lab Results  Component Value Date   INR 1.20 11/21/2015   INR 1.1* 02/07/2013    RADIOLOGY: Dg Chest Port 1 View  11/21/2015  CLINICAL DATA:  Central venous catheter placement. Initial encounter. EXAM: PORTABLE CHEST 1 VIEW COMPARISON:  Chest radiograph performed earlier today at 7:32 p.m. FINDINGS: The patient's right IJ line is noted ending about the distal SVC. An enteric tube is noted extending below the diaphragm. The patient's endotracheal tube is seen ending 4 cm above the carina. Mild vascular congestion is noted. Mild bilateral atelectasis is seen. No pleural effusion or pneumothorax is identified. The cardiomediastinal silhouette is borderline normal in size. No acute osseous abnormalities are identified. External pacing pads are noted. IMPRESSION: 1. Right IJ line noted ending about the distal SVC. 2. Endotracheal tube seen ending 4 cm above the carina. 3. Mild vascular congestion noted.  Mild bilateral  atelectasis seen. Electronically Signed   By: Garald Balding M.D.   On: 11/21/2015 22:30   Dg Chest Portable 1 View  11/21/2015  CLINICAL DATA:  Post CPR and intubation. EXAM: PORTABLE CHEST - 1 VIEW COMPARISON:  Two-view chest x-ray 11/27/2005 FINDINGS: The heart size is exaggerated by low lung volumes. Diffuse interstitial and airspace opacities are noted in the upper lung fields bilaterally, right greater than left. Atherosclerotic calcifications are present in the aorta. The patient is intubated. The endotracheal tube terminates 2.5 cm above the carina and could be pulled back 1-2 cm for more optimal positioning. Pacing pads are in place. IMPRESSION: 1. Borderline cardiomegaly with diffuse  interstitial and airspace opacities suggesting edema. Infection or aspiration is considered less likely. 2. The endotracheal tube terminates 2.5 cm from the carina and could be pulled back 1-2 cm for more optimal positioning. 3. Atherosclerosis. Electronically Signed   By: San Morelle M.D.   On: 11/21/2015 19:47      ASSESSMENT: / PLAN:    1) Cardiac arrest/CAD: I personally reviewed the cath films.  Left dominant system with diagonal disease.  Collaterals noted.  Agree with medical management.  Global LV dysfunction, likely due to arrest, shocks, CPR.  Medical therapy for now.  He was not cooled.  2) He will need beta blocker once pressors are off. BP readings appear better at this point.    Anemia:Will have to follow. Hbg ok but decreased from prior.  Long term, he will need ACE-I and statin along with the dual antiplatelet therapy.  Jettie Booze, MD  11/22/2015  9:46 AM

## 2015-11-22 NOTE — Plan of Care (Signed)
Problem: Consults Goal: Ventilated Patients Patient Education See Patient Education Module for education specifics. Outcome: Progressing Explained PEEP and fio2 to wife Goal: Skin Care Protocol Initiated - if Braden Score 18 or less If consults are not indicated, leave blank or document N/A Outcome: Progressing Prophylactic sacral pad in place.Skin - no sores or suspicious lesions or rashes or color changes  Goal: Diabetes Guidelines if Diabetic/Glucose > 140 If diabetic or lab glucose is > 140 mg/dl - Initiate Diabetes/Hyperglycemia Guidelines & Document Interventions  Outcome: Progressing No hx of diabetes but on q4 cbg's due to tube feeding

## 2015-11-22 NOTE — Progress Notes (Signed)
[place] Progress Note Patient Name: CAMREN LIPSETT DOB: Mar 18, 1956 MRN: [id number]   Date of Service  11/22/2015  HPI/Events of Note   Recent Labs Lab 11/21/15 1923 11/21/15 1942  K 3.5 2.7*    Recent Labs Lab 11/21/15 1943 11/21/15 2252  LATICACIDVEN 9.44* 4.0*    Per RN on epi gtt  eICU Interventions  Recheck bmet, mag, phos stat Change epi to levophed gtt     Intervention Category Minor Interventions: Clinical assessment - ordering diagnostic tests  Tyee Vandevoorde 11/22/2015, 12:04 AM

## 2015-11-22 NOTE — Progress Notes (Signed)
Initial Nutrition Assessment   INTERVENTION:   Initiate Vital AF 1.2 @ 20 ml/hr via OG tube and increase by 10 ml every 4 hours to goal rate of 70 ml/hr.   Tube feeding regimen provides 2016 kcal, 126 grams of protein, and 1362 ml of H2O.   NUTRITION DIAGNOSIS:   Inadequate oral intake related to inability to eat as evidenced by NPO status.  GOAL:   Patient will meet greater than or equal to 90% of their needs  MONITOR:   Vent status, Labs, Weight trends, TF tolerance  REASON FOR ASSESSMENT:   Consult Enteral/tube feeding initiation and management  ASSESSMENT:   59yo CM s/p cardiac arrest with 15 minute downtime  Patient is currently intubated on ventilator support MV: 14 L/min Temp (24hrs), Avg:98.5 F (36.9 C), Min:96.8 F (36 C), Max:99.9 F (37.7 C) OG tube   Diet Order:  Diet NPO time specified  Skin:  Reviewed, no issues  Last BM:  unknown  Height:   Ht Readings from Last 1 Encounters:  11/21/15 '5\' 11"'$  (1.803 m)   Weight:   Wt Readings from Last 1 Encounters:  11/22/15 182 lb 12.2 oz (82.9 kg)  80 kg on admission  Ideal Body Weight:  78.1 kg  BMI:  Body mass index is 25.5 kg/(m^2).  Estimated Nutritional Needs:   Kcal:  2097  Protein:  100-115 grams  Fluid:  > 2 L/day  EDUCATION NEEDS:   No education needs identified at this time  [place], [place], [place] Pager [phone] After Hours Pager

## 2015-11-22 NOTE — Progress Notes (Signed)
PULMONARY / CRITICAL CARE MEDICINE   Name: Daniel Richard MRN: [id number] DOB: 1956/09/26    ADMISSION DATE:  11/21/2015 CONSULTATION DATE:  11/21/15  REFERRING MD:  Alvino Chapel  CHIEF COMPLAINT:  S/p cardiac arrest, vtach   HISTORY OF PRESENT ILLNESS:   59yo caucasian male collapsed while at church. Bystander CPR. AED delivered 3 shocks. Epinephrine x 1. Achieved ROSC within 15 minutes approximately. Upon arrival to ED, patient was on epinephrine drip. He was assessed by cardiology and the cath lab team was called in.   SUBJECTIVE:  No events overnight.  VITAL SIGNS: BP 109/66 mmHg  Pulse 76  Temp(Src) 99.7 F (37.6 C) (Oral)  Resp 20  Ht '5\' 11"'$  (1.803 m)  Wt 82.9 kg (182 lb 12.2 oz)  BMI 25.50 kg/m2  SpO2 100%  HEMODYNAMICS:    VENTILATOR SETTINGS: Vent Mode:  [-] PRVC FiO2 (%):  [60 %-100 %] 60 % Set Rate:  [15 bmp-20 bmp] 20 bmp Vt Set:  [300 mL-600 mL] 600 mL PEEP:  [5 cmH20] 5 cmH20 Plateau Pressure:  [14 cmH20-16 cmH20] 14 cmH20  INTAKE / OUTPUT: I/O last 3 completed shifts: In: 1232.3 [P.O.:625; I.V.:457.3; NG/GT:100; IV Piggyback:50] Out: 600 [Urine:600]  PHYSICAL EXAMINATION: General:  59yo CM who appears his stated age and is currently intubated and sedated. He appears well nourished and well developed  Neuro: Neuro exam limited 2/2 sedation but reportedly followed command on wake up assessment. He was restrained for safety. Strength was clearly 5/5 on all extremities as the patient required 4 people to hold him down.  HEENT: Head, normocephalic, atraumatic. Ears symmetric, permeable. Eyes: no conjunctival icterus, no erythema. Nose: permeable, midline septum, ETT in place Cardiovascular: S1S2 RRR no murmurs, rubs, or gallops auscultated. No thrills palpated.  Lungs:  Chest symmetrical with respirations, some scattered crackles noted.  Abdomen:  Soft, nontender, no guarding, nondistended. Present bowel sounds. Musculoskeletal:  No muscle atrophy noted  nor weakness. No swelling nor tenderness of the joints.  Skin:  No notable scars, rashes, cruises. No bed sores noted.  Lymphatics: no palpable lymphadenopathy of cervical, supraclvicular nor inguinal areas.   LABS:  BMET  Recent Labs Lab 11/22/15 0005 11/22/15 0344 11/22/15 0901  NA 141 139 139  K 3.2* 5.0 5.1  CL 111 110 112*  CO2 19* 21* 22  BUN '14 14 14  '$ CREATININE 1.05 0.95 0.85  GLUCOSE 148* 138* 95    Electrolytes  Recent Labs Lab 11/21/15 1923 11/22/15 0005 11/22/15 0344 11/22/15 0901  CALCIUM 8.3* 7.3* 7.9* 7.9*  MG 2.5* 2.3 1.9  --   PHOS  --  2.6 2.9  --     CBC  Recent Labs Lab 11/21/15 1923 11/21/15 1942 11/22/15 0344  WBC 20.8*  --  13.3*  HGB 14.5 16.7 11.6*  HCT 44.4 49.0 36.1*  PLT 258  --  193    Coag's  Recent Labs Lab 11/21/15 1923  APTT 32  INR 1.20    Sepsis Markers  Recent Labs Lab 11/21/15 1943 11/21/15 2252  LATICACIDVEN 9.44* 4.0*    ABG  Recent Labs Lab 11/21/15 2004  PHART 7.203*  PCO2ART 50.4*  PO2ART 112.0*    Liver Enzymes No results for input(s): AST, ALT, ALKPHOS, BILITOT, ALBUMIN in the last 168 hours.  Cardiac Enzymes No results for input(s): TROPONINI, PROBNP in the last 168 hours.  Glucose  Recent Labs Lab 11/21/15 2001 11/21/15 2317 11/22/15 0348  GLUCAP 185* 168* 136*    Imaging Dg Chest [facility]  1 View  11/21/2015  CLINICAL DATA:  Central venous catheter placement. Initial encounter. EXAM: PORTABLE CHEST 1 VIEW COMPARISON:  Chest radiograph performed earlier today at 7:32 p.m. FINDINGS: The patient's right IJ line is noted ending about the distal SVC. An enteric tube is noted extending below the diaphragm. The patient's endotracheal tube is seen ending 4 cm above the carina. Mild vascular congestion is noted. Mild bilateral atelectasis is seen. No pleural effusion or pneumothorax is identified. The cardiomediastinal silhouette is borderline normal in size. No acute osseous abnormalities  are identified. External pacing pads are noted. IMPRESSION: 1. Right IJ line noted ending about the distal SVC. 2. Endotracheal tube seen ending 4 cm above the carina. 3. Mild vascular congestion noted.  Mild bilateral atelectasis seen. Electronically Signed   By: Garald Balding M.D.   On: 11/21/2015 22:30   Dg Chest Portable 1 View  11/21/2015  CLINICAL DATA:  Post CPR and intubation. EXAM: PORTABLE CHEST - 1 VIEW COMPARISON:  Two-view chest x-ray 11/27/2005 FINDINGS: The heart size is exaggerated by low lung volumes. Diffuse interstitial and airspace opacities are noted in the upper lung fields bilaterally, right greater than left. Atherosclerotic calcifications are present in the aorta. The patient is intubated. The endotracheal tube terminates 2.5 cm above the carina and could be pulled back 1-2 cm for more optimal positioning. Pacing pads are in place. IMPRESSION: 1. Borderline cardiomegaly with diffuse interstitial and airspace opacities suggesting edema. Infection or aspiration is considered less likely. 2. The endotracheal tube terminates 2.5 cm from the carina and could be pulled back 1-2 cm for more optimal positioning. 3. Atherosclerosis. Electronically Signed   By: San Morelle M.D.   On: 11/21/2015 19:47     STUDIES:  CXR and EKG reviewed  CULTURES: n/a  ANTIBIOTICS: n/a  SIGNIFICANT EVENTS: Patient going to cath lab  LINES/TUBES: PIV  DISCUSSION: 59yo CM s/p cardiac arrest with 15 minute downtime, appears to be purposeful in ED although combative, cath lab called in.   ASSESSMENT / PLAN:  PULMONARY A: Acute hypoxemic respiratory failure s/p cardiac arrest, some hypercapnia on ABG P:   - Vent management. Titrate PEEP/FiO2, RR and TV - Titrate O2 for sat of 88-92%. - ABG now and adjust vent accordingly.  CARDIOVASCULAR A: Cardiogenic shock on epinephrine s/p cardiac arrest, witnessed, w/15 minute downtime, bystander CPR and 4 AED shocks Original ECG with  findings of diffuse ST depression on precordial leads   P:  - Cardiology notified. Cath lab activated. - Titrate pressors to off. - Cardiology following and recommending medical management only. - D/C propofol for hypotension.  RENAL A:  AKI 2/2 cardiac arrest/ATN, BL creat 1.20 Hypokalemia, 2.7  Lactic acidosis  P:   - BMET in AM. - KVO IVF once BP is more stable. - Replace electrolytes as indicated.  GASTROINTESTINAL A:  No active issues  P:  - GI prophylaxis with famotidine  - Start TF per nutrition.  HEMATOLOGIC A:  Leukocytosis, likely reactive  P:  - CBC in AM. - Transfuse per ICU protocol.  INFECTIOUS A:   No active issues  P:   - Monitor WBC and fever curve. - No abx for now.  ENDOCRINE A:  Hyperglycemia, potentially reactive    P:   - CBG and SSI.  NEUROLOGIC A:  Toxic metabolic encephalopathy. Patient was aware and purposefully attempting to get out of the bed and grab his endotracheal tube   P:   - D/C propofol. - Fentanyl change to  a drip. - Versed drip in place of propofol. - RASS goal: 0  FAMILY  - Updates: No family bedside.  - Inter-disciplinary family meet or Palliative Care meeting due by:  day 7  The patient is critically ill with multiple organ systems failure and requires high complexity decision making for assessment and support, frequent evaluation and titration of therapies, application of advanced monitoring technologies and extensive interpretation of multiple databases.   Critical Care Time devoted to patient care services described in this note is  35  Minutes. This time reflects time of care of this signee Dr Jennet Maduro. This critical care time does not reflect procedure time, or teaching time or supervisory time of PA/NP/Med student/Med Resident etc but could involve care discussion time.  Rush Farmer, M.D. [facility] Pulmonary/Critical Care Medicine. Pager: [phone]. After hours pager: [phone].

## 2015-11-23 ENCOUNTER — Inpatient Hospital Stay (HOSPITAL_COMMUNITY): Payer: BLUE CROSS/BLUE SHIELD

## 2015-11-23 DIAGNOSIS — R509 Fever, unspecified: Secondary | ICD-10-CM

## 2015-11-23 DIAGNOSIS — G9340 Encephalopathy, unspecified: Secondary | ICD-10-CM

## 2015-11-23 DIAGNOSIS — I480 Paroxysmal atrial fibrillation: Secondary | ICD-10-CM

## 2015-11-23 LAB — BLOOD GAS, ARTERIAL
Acid-base deficit: 0.6 mmol/L (ref 0.0–2.0)
Bicarbonate: 22.2 mEq/L (ref 20.0–24.0)
Drawn by: 419771
FIO2: 0.3
MECHVT: 600 mL
O2 Saturation: 95.9 %
PEEP: 5 cmH2O
Patient temperature: 98.6
RATE: 20 resp/min
TCO2: 23.1 mmol/L (ref 0–100)
pCO2 arterial: 28.9 mmHg — ABNORMAL LOW (ref 35.0–45.0)
pH, Arterial: 7.498 — ABNORMAL HIGH (ref 7.350–7.450)
pO2, Arterial: 75.3 mmHg — ABNORMAL LOW (ref 80.0–100.0)

## 2015-11-23 LAB — BASIC METABOLIC PANEL
Anion gap: 5 (ref 5–15)
BUN: 7 mg/dL (ref 6–20)
CO2: 25 mmol/L (ref 22–32)
Calcium: 8.3 mg/dL — ABNORMAL LOW (ref 8.9–10.3)
Chloride: 107 mmol/L (ref 101–111)
Creatinine, Ser: 0.76 mg/dL (ref 0.61–1.24)
GFR calc Af Amer: >60 mL/min (ref >60)
GFR calc non Af Amer: >60 mL/min (ref >60)
Glucose, Bld: 103 mg/dL — ABNORMAL HIGH (ref 65–99)
Potassium: 3.7 mmol/L (ref 3.5–5.1)
Sodium: 137 mmol/L (ref 135–145)

## 2015-11-23 LAB — CBC
HCT: 35.3 % — ABNORMAL LOW (ref 39.0–52.0)
Hemoglobin: 11.3 g/dL — ABNORMAL LOW (ref 13.0–17.0)
MCH: 30.8 pg (ref 26.0–34.0)
MCHC: 32 g/dL (ref 30.0–36.0)
MCV: 96.2 fL (ref 78.0–100.0)
Platelets: 160 10*3/uL (ref 150–400)
RBC: 3.67 MIL/uL — ABNORMAL LOW (ref 4.22–5.81)
RDW: 13.3 % (ref 11.5–15.5)
WBC: 12.6 10*3/uL — ABNORMAL HIGH (ref 4.0–10.5)

## 2015-11-23 LAB — URINE CULTURE
Culture: NO GROWTH
Special Requests: NORMAL

## 2015-11-23 LAB — PHOSPHORUS: Phosphorus: 2.1 mg/dL — ABNORMAL LOW (ref 2.5–4.6)

## 2015-11-23 LAB — GLUCOSE, CAPILLARY
Glucose-Capillary: 109 mg/dL — ABNORMAL HIGH (ref 65–99)
Glucose-Capillary: 111 mg/dL — ABNORMAL HIGH (ref 65–99)
Glucose-Capillary: 113 mg/dL — ABNORMAL HIGH (ref 65–99)
Glucose-Capillary: 115 mg/dL — ABNORMAL HIGH (ref 65–99)
Glucose-Capillary: 137 mg/dL — ABNORMAL HIGH (ref 65–99)
Glucose-Capillary: 76 mg/dL (ref 65–99)
Glucose-Capillary: 90 mg/dL (ref 65–99)

## 2015-11-23 LAB — HEPARIN LEVEL (UNFRACTIONATED): Heparin Unfractionated: 0.4 IU/mL (ref 0.30–0.70)

## 2015-11-23 LAB — LACTIC ACID, PLASMA: Lactic Acid, Venous: 0.9 mmol/L (ref 0.5–2.0)

## 2015-11-23 LAB — MAGNESIUM: Magnesium: 1.8 mg/dL (ref 1.7–2.4)

## 2015-11-23 MED ORDER — MIDAZOLAM HCL 2 MG/2ML IJ SOLN
1.0000 mg | INTRAMUSCULAR | Status: DC | PRN
  Filled 2015-11-23: qty 2

## 2015-11-23 MED ORDER — POTASSIUM CHLORIDE 20 MEQ/15ML (10%) PO SOLN
40.0000 meq | Freq: Three times a day (TID) | ORAL | Status: DC
  Filled 2015-11-23: qty 30

## 2015-11-23 MED ORDER — MAGNESIUM SULFATE 2 GM/50ML IV SOLN
2.0000 g | Freq: Once | INTRAVENOUS | Status: AC
  Administered 2015-11-23: 2 g via INTRAVENOUS
  Filled 2015-11-23: qty 50

## 2015-11-23 MED ORDER — CETYLPYRIDINIUM CHLORIDE 0.05 % MT LIQD
7.0000 mL | Freq: Two times a day (BID) | OROMUCOSAL | Status: DC
  Administered 2015-11-23 – 2015-11-28 (×11): 7 mL via OROMUCOSAL

## 2015-11-23 MED ORDER — POTASSIUM PHOSPHATES 15 MMOLE/5ML IV SOLN
40.0000 meq | Freq: Once | INTRAVENOUS | Status: AC
  Administered 2015-11-23: 40 meq via INTRAVENOUS
  Filled 2015-11-23: qty 9.09

## 2015-11-23 MED ORDER — HEPARIN (PORCINE) IN NACL 100-0.45 UNIT/ML-% IJ SOLN
1500.0000 [IU]/h | INTRAMUSCULAR | Status: DC
  Administered 2015-11-23 – 2015-11-24 (×2): 1200 [IU]/h via INTRAVENOUS
  Filled 2015-11-23 (×3): qty 250

## 2015-11-23 MED ORDER — FUROSEMIDE 10 MG/ML IJ SOLN
40.0000 mg | Freq: Three times a day (TID) | INTRAMUSCULAR | Status: AC
  Administered 2015-11-23 (×2): 40 mg via INTRAVENOUS
  Filled 2015-11-23 (×2): qty 4

## 2015-11-23 NOTE — Progress Notes (Signed)
ANTICOAGULATION CONSULT NOTE - Follow up  Pharmacy Consult for Heparin  Indication: atrial fibrillation  Patient Measurements: Height: '5\' 11"'$  (180.3 cm) Weight: 183 lb 3.2 oz (83.1 kg) IBW/kg (Calculated) : 75.3 Heparin Dosing Weight: 83 kg   Vital Signs: Temp: 101.3 F (38.5 C) (12/13 1700) Temp Source: Core (Comment) (12/13 1600) BP: 143/81 mmHg (12/13 1700) Pulse Rate: 103 (12/13 1700)  Labs:  Recent Labs  11/21/15 1923 11/21/15 1942  11/22/15 0344 11/22/15 0901 11/23/15 0455 11/23/15 1810  HGB 14.5 16.7  --  11.6*  --  11.3*  --   HCT 44.4 49.0  --  36.1*  --  35.3*  --   PLT 258  --   --  193  --  160  --   APTT 32  --   --   --   --   --   --   LABPROT 15.4*  --   --   --   --   --   --   INR 1.20  --   --   --   --   --   --   HEPARINUNFRC  --   --   --   --   --   --  0.40  CREATININE 1.38* 1.20  < > 0.95 0.85 0.76  --   < > = values in this interval not displayed.   Assessment: 65 YOM who is s/p PCI for STEMI. He remains intubated. Currently in Afib with HR controlled which cardiology thinks might be provoked by illness. Pharmacy consulted to start IV heparin. H/H low. Plt wnl. He received a dose of prophylactic Lovenox this AM at 0804.   HL: 0.4 on 1200 units/hr of heparin, no sxs of bleeding, CBC as above  Goal of Therapy:  Heparin level 0.3-0.7 units/ml Monitor platelets by anticoagulation protocol: Yes   Plan:  1. Continue IV heparin at 1200 units/hr  2. Daily HL (confirmatory tom am) 3. Monitor daily HL, CBC and s/s of bleeding   Vincenza Hews, PharmD, BCPS 11/23/2015, 6:50 PM Pager: [phone]

## 2015-11-23 NOTE — Progress Notes (Signed)
ANTICOAGULATION CONSULT NOTE - Initial Consult  Pharmacy Consult for Heparin  Indication: atrial fibrillation  Allergies  Allergen Reactions  . Lipitor [Atorvastatin]     myalgia  . Meperidine Hcl     Patient Measurements: Height: '5\' 11"'$  (180.3 cm) Weight: 183 lb 3.2 oz (83.1 kg) IBW/kg (Calculated) : 75.3 Heparin Dosing Weight: 83 kg   Vital Signs: Temp: 101.5 F (38.6 C) (12/13 0900) Temp Source: Core (Comment) (12/13 0754) BP: 128/75 mmHg (12/13 0900) Pulse Rate: 116 (12/13 0900)  Labs:  Recent Labs  11/21/15 1923 11/21/15 1942  11/22/15 0344 11/22/15 0901 11/23/15 0455  HGB 14.5 16.7  --  11.6*  --  11.3*  HCT 44.4 49.0  --  36.1*  --  35.3*  PLT 258  --   --  193  --  160  APTT 32  --   --   --   --   --   LABPROT 15.4*  --   --   --   --   --   INR 1.20  --   --   --   --   --   CREATININE 1.38* 1.20  < > 0.95 0.85 0.76  < > = values in this interval not displayed.  Estimated Creatinine Clearance: 107.2 mL/min (by C-G formula based on Cr of 0.76).   Medical History: Past Medical History  Diagnosis Date  . ALLERGIC RHINITIS   . ANXIETY   . LOW BACK PAIN   . HYPERTENSION   . HYPERLIPIDEMIA     Medications:  Prescriptions prior to admission  Medication Sig Dispense Refill Last Dose  . amLODipine (NORVASC) 2.5 MG tablet Take 1 tablet (2.5 mg total) by mouth daily. 90 tablet 3 Past Week at Unknown time  . aspirin 81 MG EC tablet Take 1 tablet (81 mg total) by mouth daily. Swallow whole. 30 tablet 12 Past Week at Unknown time  . simvastatin (ZOCOR) 20 MG tablet Take 1 tablet (20 mg total) by mouth at bedtime. 90 tablet 3 Past Week at Unknown time  . tiZANidine (ZANAFLEX) 4 MG tablet Take 1 tablet (4 mg total) by mouth every 6 (six) hours as needed for muscle spasms. 40 tablet 0 Past Week at Unknown time    Assessment: 83 YOM who is s/p PCI for STEMI. He remains intubated. Currently in Afib with HR controlled which cardiology thinks might be provoked  by illness. Pharmacy consulted to start IV heparin. H/H low. Plt wnl. He received a dose of prophylactic Lovenox this AM at 0804.   Goal of Therapy:  Heparin level 0.3-0.7 units/ml Monitor platelets by anticoagulation protocol: Yes   Plan:  -Start IV heparin at 1200 units/hr WITHOUT bolus -F/u 6 hr HL -Monitor daily HL, CBC and s/s of bleeding   Albertina Parr, PharmD., BCPS Clinical Pharmacist Pager [phone]

## 2015-11-23 NOTE — Progress Notes (Signed)
Wasted 85m of fentanyl, and 575mof versed in sink. Witnessed by FrJulien GirtRN and ShAlben DeedsRN.

## 2015-11-23 NOTE — Progress Notes (Signed)
PULMONARY / CRITICAL CARE MEDICINE   Name: Daniel Richard MRN: [id number] DOB: 30-Jan-1956    ADMISSION DATE:  11/21/2015 CONSULTATION DATE:  11/21/15  REFERRING MD:  Alvino Chapel  CHIEF COMPLAINT:  S/p cardiac arrest, vtach   HISTORY OF PRESENT ILLNESS:   59yo caucasian male collapsed while at church. Bystander CPR. AED delivered 3 shocks. Epinephrine x 1. Achieved ROSC within 15 minutes approximately. Upon arrival to ED, patient was on epinephrine drip. He was assessed by cardiology and the cath lab team was called in.   SUBJECTIVE:  A-fib overnight, febrile.  VITAL SIGNS: BP 128/75 mmHg  Pulse 116  Temp(Src) 101.5 F (38.6 C) (Core (Comment))  Resp 20  Ht '5\' 11"'$  (1.803 m)  Wt 83.1 kg (183 lb 3.2 oz)  BMI 25.56 kg/m2  SpO2 100%  HEMODYNAMICS:    VENTILATOR SETTINGS: Vent Mode:  [-] PRVC FiO2 (%):  [30 %-40 %] 30 % Set Rate:  [20 bmp] 20 bmp Vt Set:  [600 mL] 600 mL PEEP:  [5 cmH20] 5 cmH20 Plateau Pressure:  [14 cmH20-18 cmH20] 17 cmH20  INTAKE / OUTPUT: I/O last 3 completed shifts: In: 3359.8 [P.O.:625; I.V.:1090.3; Other:40; NG/GT:954.5; IV Piggyback:650] Out: 27 [Urine:2430; Emesis/NG output:10]  PHYSICAL EXAMINATION: General:  59yo CM who appears his stated age and is currently intubated and sedated. He appears well nourished and well developed  Neuro: Neuro exam limited 2/2 sedation but reportedly followed command on wake up assessment. He was restrained for safety. Strength was clearly 5/5 on all extremities as the patient required 4 people to hold him down.  HEENT: Head, normocephalic, atraumatic. Ears symmetric, permeable. Eyes: no conjunctival icterus, no erythema. Nose: permeable, midline septum, ETT in place Cardiovascular: S1S2 RRR no murmurs, rubs, or gallops auscultated. No thrills palpated.  Lungs:  Chest symmetrical with respirations, some scattered crackles noted.  Abdomen:  Soft, nontender, no guarding, nondistended. Present bowel  sounds. Musculoskeletal:  No muscle atrophy noted nor weakness. No swelling nor tenderness of the joints.  Skin:  No notable scars, rashes, cruises. No bed sores noted.  Lymphatics: no palpable lymphadenopathy of cervical, supraclvicular nor inguinal areas.   LABS:  BMET  Recent Labs Lab 11/22/15 0344 11/22/15 0901 11/23/15 0455  NA 139 139 137  K 5.0 5.1 3.7  CL 110 112* 107  CO2 21* 22 25  BUN '14 14 7  '$ CREATININE 0.95 0.85 0.76  GLUCOSE 138* 95 103*   Electrolytes  Recent Labs Lab 11/22/15 0005 11/22/15 0344 11/22/15 0901 11/23/15 0455  CALCIUM 7.3* 7.9* 7.9* 8.3*  MG 2.3 1.9  --  1.8  PHOS 2.6 2.9  --  2.1*   CBC  Recent Labs Lab 11/21/15 1923 11/21/15 1942 11/22/15 0344 11/23/15 0455  WBC 20.8*  --  13.3* 12.6*  HGB 14.5 16.7 11.6* 11.3*  HCT 44.4 49.0 36.1* 35.3*  PLT 258  --  193 160   Coag's  Recent Labs Lab 11/21/15 1923  APTT 32  INR 1.20   Sepsis Markers  Recent Labs Lab 11/21/15 1943 11/21/15 2252 11/23/15 0455  LATICACIDVEN 9.44* 4.0* 0.9   ABG  Recent Labs Lab 11/21/15 2004 11/22/15 1231 11/23/15 0350  PHART 7.203* 7.444 7.498*  PCO2ART 50.4* 29.4* 28.9*  PO2ART 112.0* 152* 75.3*   Liver Enzymes No results for input(s): AST, ALT, ALKPHOS, BILITOT, ALBUMIN in the last 168 hours.  Cardiac Enzymes No results for input(s): TROPONINI, PROBNP in the last 168 hours.  Glucose  Recent Labs Lab 11/22/15 0348 11/22/15 0903 11/22/15  1733 11/22/15 2023 11/23/15 0108 11/23/15 0347  GLUCAP 136* 87 87 101* 90 115*   Imaging Dg Chest Port 1 View  11/23/2015  CLINICAL DATA:  Intubation. EXAM: PORTABLE CHEST 1 VIEW COMPARISON:  11/22/2015 . FINDINGS: Endotracheal tube, NG tube, right IJ line in stable position. Heart size stable. Bibasilar subsegmental atelectasis and/or infiltrates. No pleural effusion or pneumothorax . IMPRESSION: 1. Lines and tubes stable position. 2. Mild bibasilar subsegmental atelectasis and/or  infiltrates. No interim change. Electronically Signed   By: Marcello Moores  Register   On: 11/23/2015 07:16   Dg Chest Port 1 View  11/22/2015  CLINICAL DATA:  Fever and respiratory failure. EXAM: PORTABLE CHEST 1 VIEW COMPARISON:  11/21/2015 FINDINGS: Mild bibasilar atelectasis present. Endotracheal tube tip is approximately 3 cm above the carina. Right jugular central line tip stable at the cavoatrial junction. A nasogastric tube extends into the stomach. Temporary pacing pad overlies the left chest. No evidence of pulmonary edema, focal airspace consolidation, pneumothorax or visualized pleural fluid. The heart size is normal. IMPRESSION: Mild bibasilar atelectasis. Electronically Signed   By: Aletta Edouard M.D.   On: 11/22/2015 18:40   STUDIES:  CXR and EKG reviewed  CULTURES: Blood 12/12>>> Urine 12/12>>> Sputum 12/12>>>  ANTIBIOTICS: Vanc 12/12>>> Zosyn 12/12>>>  SIGNIFICANT EVENTS: Patient going to cath lab  LINES/TUBES: PIV  DISCUSSION: 59yo CM s/p cardiac arrest with 15 minute downtime, appears to be purposeful in ED although combative, cath lab called in.   ASSESSMENT / PLAN:  PULMONARY A: Acute hypoxemic respiratory failure s/p cardiac arrest, some hypercapnia on ABG P:   - Vent management. Titrate PEEP/FiO2, RR and TV. - Titrate O2 for sat of 88-92%. - ABG now and adjust vent accordingly. - Begin PS trials.  CARDIOVASCULAR A: Cardiogenic shock on epinephrine s/p cardiac arrest, witnessed, w/15 minute downtime, bystander CPR and 4 AED shocks Original ECG with findings of diffuse ST depression on precordial leads   P:  - Cardiology following, appreciate input. - Titrate pressors to off as able. - Cardiology following and recommending medical management only.  RENAL A:  AKI 2/2 cardiac arrest/ATN, BL creat 1.20 Hypokalemia, 2.7  Lactic acidosis  P:   - BMET in AM. - KVO IVF once BP is more stable. - Replace electrolytes as indicated.  GASTROINTESTINAL A:   No active issues  P:  - GI prophylaxis with famotidine  - Continue TF per nutrition.  HEMATOLOGIC A:  Leukocytosis, likely reactive  P:  - CBC in AM. - Transfuse per ICU protocol.  INFECTIOUS A:   No active issues  P:   - Monitor WBC and fever curve. - No abx for now.  ENDOCRINE A:  Hyperglycemia, potentially reactive     P:   - CBG and SSI.  NEUROLOGIC A:  Toxic metabolic encephalopathy. Patient was aware and purposefully attempting to get out of the bed and grab his endotracheal tube   P:   - D/Ced propofol. - Fentanyl change to a drip. - Change versed to pushes. - RASS goal: 0  FAMILY  - Updates: No family bedside.  - Inter-disciplinary family meet or Palliative Care meeting due by:  day 7  The patient is critically ill with multiple organ systems failure and requires high complexity decision making for assessment and support, frequent evaluation and titration of therapies, application of advanced monitoring technologies and extensive interpretation of multiple databases.   Critical Care Time devoted to patient care services described in this note is  35  Minutes. This  time reflects time of care of this signee Dr Jennet Maduro. This critical care time does not reflect procedure time, or teaching time or supervisory time of PA/NP/Med student/Med Resident etc but could involve care discussion time.  Rush Farmer, M.D. [facility] Pulmonary/Critical Care Medicine. Pager: [phone]. After hours pager: [phone].

## 2015-11-23 NOTE — Procedures (Signed)
Extubation Procedure Note  Patient Details:   Name: Daniel Richard DOB: 03-07-1956 MRN: [id number]   Airway Documentation:     Evaluation  O2 sats: stable throughout Complications: No apparent complications Patient did tolerate procedure well. Bilateral Breath Sounds: Clear Suctioning: Airway Yes  PT was extubate to a 3L   PT was able to speak. Sats are stable  Shevaun Lovan, Leonie Douglas 11/23/2015, 11:53 AM

## 2015-11-23 NOTE — Progress Notes (Signed)
Subjective:    Day of hospitalization: 2   Pt p/w witnessed out of hospital cardiac arrest. CPR was performed followed by defibrillation by EMS. EKG showed possible subtle anterolateral ST elevation.  LHC with significant branches disease with occlusion of first diagonal likely culprit for vfib.  However, vessel is 1.37m in diameter and is diffusely diseased but has collaterals.  No obstruction on main vessels.  Recommend medical mgmt.    Pt remains intubated.  Febrile overnight to 102.  Objective:   Temp:  [99 F (37.2 C)-101.7 F (38.7 C)] 101.7 F (38.7 C) (12/13 0600) Pulse Rate:  [70-105] 93 (12/13 0600) Resp:  [15-21] 20 (12/13 0600) BP: (105-191)/(65-163) 105/74 mmHg (12/13 0600) SpO2:  [99 %-100 %] 100 % (12/13 0600) FiO2 (%):  [30 %-60 %] 30 % (12/13 0400) Weight:  [83.1 kg (183 lb 3.2 oz)] 83.1 kg (183 lb 3.2 oz) (12/13 0500)    Filed Weights   11/21/15 2140 11/22/15 0356 11/23/15 0500  Weight: 80.2 kg (176 lb 12.9 oz) 82.9 kg (182 lb 12.2 oz) 83.1 kg (183 lb 3.2 oz)    Intake/Output Summary (Last 24 hours) at 11/23/15 0631 Last data filed at 11/23/15 0600  Gross per 24 hour  Intake 2050.46 ml  Output   1840 ml  Net 210.46 ml    Telemetry: AFib  Physical Exam: General: NAD, opens eyes and follows commands.  Lungs: CTAB. Cardiac: tachycardic, no m/r/g. Abdomen: +BS, NT/ND.   Extremities: No LE edema. Radial cath site without hematoma. Neuro: Sedated but responds to commands.  Lab Results:  Basic Metabolic Panel:  Recent Labs Lab 11/22/15 0005 11/22/15 0344 11/22/15 0901 11/23/15 0455  NA 141 139 139 137  K 3.2* 5.0 5.1 3.7  CL 111 110 112* 107  CO2 19* 21* 22 25  GLUCOSE 148* 138* 95 103*  BUN '14 14 14 7  '$ CREATININE 1.05 0.95 0.85 0.76  CALCIUM 7.3* 7.9* 7.9* 8.3*  MG 2.3 1.9  --  1.8    Liver Function Tests: No results for input(s): AST, ALT, ALKPHOS, BILITOT, PROT, ALBUMIN in the last 168 hours.  CBC:  Recent Labs Lab  11/21/15 1923 11/21/15 1942 11/22/15 0344 11/23/15 0455  WBC 20.8*  --  13.3* 12.6*  HGB 14.5 16.7 11.6* 11.3*  HCT 44.4 49.0 36.1* 35.3*  MCV 98.2  --  96.3 96.2  PLT 258  --  193 160    Cardiac Enzymes: No results for input(s): CKTOTAL, CKMB, CKMBINDEX, TROPONINI in the last 168 hours.  BNP: No results for input(s): PROBNP in the last 8760 hours.  Coagulation:  Recent Labs Lab 11/21/15 1923  INR 1.20    Radiology: Dg Chest Port 1 View  11/22/2015  CLINICAL DATA:  Fever and respiratory failure. EXAM: PORTABLE CHEST 1 VIEW COMPARISON:  11/21/2015 FINDINGS: Mild bibasilar atelectasis present. Endotracheal tube tip is approximately 3 cm above the carina. Right jugular central line tip stable at the cavoatrial junction. A nasogastric tube extends into the stomach. Temporary pacing pad overlies the left chest. No evidence of pulmonary edema, focal airspace consolidation, pneumothorax or visualized pleural fluid. The heart size is normal. IMPRESSION: Mild bibasilar atelectasis. Electronically Signed   By: GAletta EdouardM.D.   On: 11/22/2015 18:40   Dg Chest Port 1 View  11/21/2015  CLINICAL DATA:  Central venous catheter placement. Initial encounter. EXAM: PORTABLE CHEST 1 VIEW COMPARISON:  Chest radiograph performed earlier today at 7:32 p.m. FINDINGS: The patient's right IJ line  is noted ending about the distal SVC. An enteric tube is noted extending below the diaphragm. The patient's endotracheal tube is seen ending 4 cm above the carina. Mild vascular congestion is noted. Mild bilateral atelectasis is seen. No pleural effusion or pneumothorax is identified. The cardiomediastinal silhouette is borderline normal in size. No acute osseous abnormalities are identified. External pacing pads are noted. IMPRESSION: 1. Right IJ line noted ending about the distal SVC. 2. Endotracheal tube seen ending 4 cm above the carina. 3. Mild vascular congestion noted.  Mild bilateral atelectasis seen.  Electronically Signed   By: Garald Balding M.D.   On: 11/21/2015 22:30   Dg Chest Portable 1 View  11/21/2015  CLINICAL DATA:  Post CPR and intubation. EXAM: PORTABLE CHEST - 1 VIEW COMPARISON:  Two-view chest x-ray 11/27/2005 FINDINGS: The heart size is exaggerated by low lung volumes. Diffuse interstitial and airspace opacities are noted in the upper lung fields bilaterally, right greater than left. Atherosclerotic calcifications are present in the aorta. The patient is intubated. The endotracheal tube terminates 2.5 cm above the carina and could be pulled back 1-2 cm for more optimal positioning. Pacing pads are in place. IMPRESSION: 1. Borderline cardiomegaly with diffuse interstitial and airspace opacities suggesting edema. Infection or aspiration is considered less likely. 2. The endotracheal tube terminates 2.5 cm from the carina and could be pulled back 1-2 cm for more optimal positioning. 3. Atherosclerosis. Electronically Signed   By: San Morelle M.D.   On: 11/21/2015 19:47    Cardiac studies:   ECG:   Medications:   Scheduled Medications: . antiseptic oral rinse  7 mL Mouth Rinse 10 times per day  . aspirin  81 mg Oral Daily  . carvedilol  6.25 mg Oral BID  . chlorhexidine gluconate  15 mL Mouth Rinse BID  . clopidogrel  75 mg Oral Q breakfast  . enoxaparin (LOVENOX) injection  40 mg Subcutaneous Q24H  . famotidine (PEPCID) IV  20 mg Intravenous Q12H  . insulin aspart  2-6 Units Subcutaneous 6 times per day  . piperacillin-tazobactam (ZOSYN)  IV  3.375 g Intravenous Q8H  . sodium chloride  3 mL Intravenous Q12H  . vancomycin  1,000 mg Intravenous Q8H    Infusions: . epinephrine Stopped (11/22/15 0233)  . feeding supplement (VITAL AF 1.2 CAL) 1,000 mL (11/23/15 0500)  . fentaNYL infusion INTRAVENOUS 150 mcg/hr (11/23/15 0130)  . midazolam (VERSED) infusion 2 mg/hr (11/23/15 0204)  . norepinephrine (LEVOPHED) Adult infusion Stopped (11/22/15 0832)  . propofol  (DIPRIVAN) infusion Stopped (11/22/15 1243)    PRN Medications: sodium chloride, sodium chloride, acetaminophen (TYLENOL) oral liquid 160 mg/5 mL, acetaminophen, fentaNYL (SUBLIMAZE) injection, fentaNYL (SUBLIMAZE) injection, sodium chloride   Assessment and Plan:   Cardiac arrest/CAD Left dominant system with diagonal disease. Collaterals noted. Agree with medical management. Global LV dysfunction, likely due to arrest, shocks, CPR. Medical therapy for now. He was not cooled. -cont coreg 6.'25mg'$  bid -plavix/ASA -add statin  -will need to add ACEi eventually   Anemia Hgb stable.  -monitor  Fever Febrile up to 102 with leukocytosis.   -per PCCM -blood/urine cx pending -repeat CXR pending  -vanc/zosyn per pharmacy   ?Atrial fibrillation  Monitor showing afib with HR controlled.  Likely provoked by acute illness.   -EKG  -cont coreg  -heparin per pharmacy   Jones Bales, MD PGY-3, Internal Medicine Teaching Service 11/23/2015, 6:31 AM   I have examined the patient and reviewed assessment and plan and discussed with  patient.  Agree with above as stated.  Start IV heparin given new onset AFib.  Carvedilol for HTN and HR control. Still intubated.  Follows commands per report.  Bethani Brugger S.

## 2015-11-24 ENCOUNTER — Inpatient Hospital Stay (HOSPITAL_COMMUNITY): Payer: BLUE CROSS/BLUE SHIELD

## 2015-11-24 DIAGNOSIS — I25119 Atherosclerotic heart disease of native coronary artery with unspecified angina pectoris: Secondary | ICD-10-CM | POA: Diagnosis present

## 2015-11-24 DIAGNOSIS — I480 Paroxysmal atrial fibrillation: Secondary | ICD-10-CM | POA: Diagnosis present

## 2015-11-24 LAB — MAGNESIUM: Magnesium: 2.1 mg/dL (ref 1.7–2.4)

## 2015-11-24 LAB — CBC
HCT: 37.5 % — ABNORMAL LOW (ref 39.0–52.0)
Hemoglobin: 12.4 g/dL — ABNORMAL LOW (ref 13.0–17.0)
MCH: 31.6 pg (ref 26.0–34.0)
MCHC: 33.1 g/dL (ref 30.0–36.0)
MCV: 95.7 fL (ref 78.0–100.0)
Platelets: 175 10*3/uL (ref 150–400)
RBC: 3.92 MIL/uL — ABNORMAL LOW (ref 4.22–5.81)
RDW: 13.1 % (ref 11.5–15.5)
WBC: 13.7 10*3/uL — ABNORMAL HIGH (ref 4.0–10.5)

## 2015-11-24 LAB — BASIC METABOLIC PANEL
Anion gap: 8 (ref 5–15)
BUN: 7 mg/dL (ref 6–20)
CO2: 29 mmol/L (ref 22–32)
Calcium: 8.6 mg/dL — ABNORMAL LOW (ref 8.9–10.3)
Chloride: 101 mmol/L (ref 101–111)
Creatinine, Ser: 0.78 mg/dL (ref 0.61–1.24)
GFR calc Af Amer: >60 mL/min (ref >60)
GFR calc non Af Amer: >60 mL/min (ref >60)
Glucose, Bld: 112 mg/dL — ABNORMAL HIGH (ref 65–99)
Potassium: 3.7 mmol/L (ref 3.5–5.1)
Sodium: 138 mmol/L (ref 135–145)

## 2015-11-24 LAB — PHOSPHORUS: Phosphorus: 3.5 mg/dL (ref 2.5–4.6)

## 2015-11-24 LAB — GLUCOSE, CAPILLARY
Glucose-Capillary: 109 mg/dL — ABNORMAL HIGH (ref 65–99)
Glucose-Capillary: 116 mg/dL — ABNORMAL HIGH (ref 65–99)
Glucose-Capillary: 142 mg/dL — ABNORMAL HIGH (ref 65–99)
Glucose-Capillary: 86 mg/dL (ref 65–99)
Glucose-Capillary: 95 mg/dL (ref 65–99)
Glucose-Capillary: 95 mg/dL (ref 65–99)

## 2015-11-24 LAB — POCT I-STAT 3, ART BLOOD GAS (G3+)
Acid-Base Excess: 4 mmol/L — ABNORMAL HIGH (ref 0.0–2.0)
Bicarbonate: 27.9 mEq/L — ABNORMAL HIGH (ref 20.0–24.0)
O2 Saturation: 98 %
Patient temperature: 98.6
TCO2: 29 mmol/L (ref 0–100)
pCO2 arterial: 36.9 mmHg (ref 35.0–45.0)
pH, Arterial: 7.487 — ABNORMAL HIGH (ref 7.350–7.450)
pO2, Arterial: 96 mmHg (ref 80.0–100.0)

## 2015-11-24 LAB — HEPARIN LEVEL (UNFRACTIONATED): Heparin Unfractionated: 0.37 IU/mL (ref 0.30–0.70)

## 2015-11-24 LAB — VANCOMYCIN, TROUGH: Vancomycin Tr: 12 ug/mL (ref 10.0–20.0)

## 2015-11-24 LAB — TRIGLYCERIDES: Triglycerides: 141 mg/dL (ref <150)

## 2015-11-24 MED ORDER — CARVEDILOL 12.5 MG PO TABS
12.5000 mg | ORAL_TABLET | Freq: Two times a day (BID) | ORAL | Status: DC
  Administered 2015-11-24 – 2015-11-26 (×4): 12.5 mg via ORAL
  Filled 2015-11-24 (×4): qty 1

## 2015-11-24 MED ORDER — FAMOTIDINE 20 MG PO TABS
20.0000 mg | ORAL_TABLET | Freq: Two times a day (BID) | ORAL | Status: DC
  Administered 2015-11-24 – 2015-11-30 (×12): 20 mg via ORAL
  Filled 2015-11-24 (×12): qty 1

## 2015-11-24 NOTE — Progress Notes (Signed)
ANTICOAGULATION CONSULT NOTE - Follow up  Pharmacy Consult for Heparin  Indication: atrial fibrillation  Patient Measurements: Height: '5\' 11"'$  (180.3 cm) Weight: 176 lb 9.4 oz (80.1 kg) IBW/kg (Calculated) : 75.3 Heparin Dosing Weight: 83 kg   Vital Signs: Temp: 98.4 F (36.9 C) (12/14 0844) Temp Source: Oral (12/14 0844) BP: 130/79 mmHg (12/14 0700) Pulse Rate: 101 (12/14 0700)  Labs:  Recent Labs  11/21/15 1923  11/22/15 0344 11/22/15 0901 11/23/15 0455 11/23/15 1810 11/24/15 0444  HGB 14.5  < > 11.6*  --  11.3*  --  12.4*  HCT 44.4  < > 36.1*  --  35.3*  --  37.5*  PLT 258  --  193  --  160  --  175  APTT 32  --   --   --   --   --   --   LABPROT 15.4*  --   --   --   --   --   --   INR 1.20  --   --   --   --   --   --   HEPARINUNFRC  --   --   --   --   --  0.40 0.37  CREATININE 1.38*  < > 0.95 0.85 0.76  --  0.78  < > = values in this interval not displayed.   Assessment: 19 YOM who is s/p PCI for STEMI. He remains intubated. Currently in Afib with HR controlled which cardiology thinks might be provoked by illness. Pharmacy consulted to start IV heparin. H/H low. Plt wnl. He received a dose of prophylactic Lovenox this AM at 0804.   HL: 0.37 on 1200 units/hr of heparin, no sxs of bleeding, CBC as above  Goal of Therapy:  Heparin level 0.3-0.7 units/ml Monitor platelets by anticoagulation protocol: Yes   Plan:  1. Continue IV heparin at 1200 units/hr  2. Daily HL (confirmatory tom am) 3. Monitor daily HL, CBC and s/s of bleeding   Joya San, PharmD Clinical Pharmacy Resident Pager # [phone] 11/24/2015 11:30 AM

## 2015-11-24 NOTE — Progress Notes (Signed)
Subjective:    Day of hospitalization: 3   Pt p/w witnessed out of hospital cardiac arrest. CPR was performed followed by defibrillation by EMS. EKG showed possible subtle anterolateral ST elevation.  LHC with significant branches disease with occlusion of first diagonal likely culprit for vfib.  However, vessel is 1.64m in diameter and is diffusely diseased but has collaterals.  No obstruction on main vessels.  Recommend medical mgmt.    Pt extubated yesterday and doing well.  Having some chest soreness from the compressions and sore throat.   Objective:   Temp:  [98.4 F (36.9 C)-101.5 F (38.6 C)] 98.4 F (36.9 C) (12/14 0844) Pulse Rate:  [84-113] 101 (12/14 0700) Resp:  [17-29] 19 (12/14 0700) BP: (109-143)/(69-121) 130/79 mmHg (12/14 0700) SpO2:  [92 %-100 %] 100 % (12/14 0700) FiO2 (%):  [30 %] 30 % (12/13 1121) Weight:  [80.1 kg (176 lb 9.4 oz)] 80.1 kg (176 lb 9.4 oz) (12/14 0444)    Filed Weights   11/22/15 0356 11/23/15 0500 11/24/15 0444  Weight: 82.9 kg (182 lb 12.2 oz) 83.1 kg (183 lb 3.2 oz) 80.1 kg (176 lb 9.4 oz)    Intake/Output Summary (Last 24 hours) at 11/24/15 1051 Last data filed at 11/24/15 0800  Gross per 24 hour  Intake   1739 ml  Output   6200 ml  Net  -4461 ml    Telemetry: AFib  Physical Exam: General: NAD, sitting up in chair.  Lungs: CTAB. Cardiac: tachycardic, no m/r/g. Abdomen: +BS, NT/ND.   Extremities: No LE edema. Radial cath site without hematoma. Neuro: A&O x3.  Moving all extremities.   Lab Results:  Basic Metabolic Panel:  Recent Labs Lab 11/22/15 0344 11/22/15 0901 11/23/15 0455 11/24/15 0444  NA 139 139 137 138  K 5.0 5.1 3.7 3.7  CL 110 112* 107 101  CO2 21* '22 25 29  '$ GLUCOSE 138* 95 103* 112*  BUN '14 14 7 7  '$ CREATININE 0.95 0.85 0.76 0.78  CALCIUM 7.9* 7.9* 8.3* 8.6*  MG 1.9  --  1.8 2.1    Liver Function Tests: No results for input(s): AST, ALT, ALKPHOS, BILITOT, PROT, ALBUMIN in the last 168  hours.  CBC:  Recent Labs Lab 11/22/15 0344 11/23/15 0455 11/24/15 0444  WBC 13.3* 12.6* 13.7*  HGB 11.6* 11.3* 12.4*  HCT 36.1* 35.3* 37.5*  MCV 96.3 96.2 95.7  PLT 193 160 175    Cardiac Enzymes: No results for input(s): CKTOTAL, CKMB, CKMBINDEX, TROPONINI in the last 168 hours.  BNP: No results for input(s): PROBNP in the last 8760 hours.  Coagulation:  Recent Labs Lab 11/21/15 1923  INR 1.20    Radiology: Dg Chest Port 1 View  11/24/2015  CLINICAL DATA:  Endotracheal tube.  Shortness of breath. EXAM: PORTABLE CHEST 1 VIEW COMPARISON:  11/22/2015; 11/21/2015 ; 11/27/2005 FINDINGS: Grossly unchanged borderline enlarged cardiac silhouette and mediastinal contours given persistently reduced lung volumes. Interval removal of enteric tube. Interval extubation. Stable position of remaining support apparatus. Slight worsening of bilateral medial basilar heterogeneous opacities. Pulmonary venous congestion without frank evidence of edema. No pleural effusion or pneumothorax. Unchanged bones . IMPRESSION: 1. Interval extubation and removal of enteric tube.  No pneumothorax 2. Worsening bilateral medial basilar heterogeneous/consolidative opacities, atelectasis versus infiltrate. Continued attention on follow-up is recommended. 3. Pulmonary venous congestion without frank evidence of edema. Electronically Signed   By: JSandi MariscalM.D.   On: 11/24/2015 07:14   [facility]  [facility]  CLINICAL DATA:  Intubation. EXAM: PORTABLE CHEST 1 VIEW COMPARISON:  11/22/2015 . FINDINGS: Endotracheal tube, NG tube, right IJ line in stable position. Heart size stable. Bibasilar subsegmental atelectasis and/or infiltrates. No pleural effusion or pneumothorax . IMPRESSION: 1. Lines and tubes stable position. 2. Mild bibasilar subsegmental atelectasis and/or infiltrates. No interim change. Electronically Signed   By: Marcello Moores  Register   On: 11/23/2015 07:16   Dg Chest Port 1 View  11/22/2015   CLINICAL DATA:  Fever and respiratory failure. EXAM: PORTABLE CHEST 1 VIEW COMPARISON:  11/21/2015 FINDINGS: Mild bibasilar atelectasis present. Endotracheal tube tip is approximately 3 cm above the carina. Right jugular central line tip stable at the cavoatrial junction. A nasogastric tube extends into the stomach. Temporary pacing pad overlies the left chest. No evidence of pulmonary edema, focal airspace consolidation, pneumothorax or visualized pleural fluid. The heart size is normal. IMPRESSION: Mild bibasilar atelectasis. Electronically Signed   By: Aletta Edouard M.D.   On: 11/22/2015 18:40    Cardiac studies:   ECG:   Medications:   Scheduled Medications: . antiseptic oral rinse  7 mL Mouth Rinse BID  . aspirin  81 mg Oral Daily  . carvedilol  12.5 mg Oral BID WC  . clopidogrel  75 mg Oral Q breakfast  . famotidine (PEPCID) IV  20 mg Intravenous Q12H  . insulin aspart  2-6 Units Subcutaneous 6 times per day  . piperacillin-tazobactam (ZOSYN)  IV  3.375 g Intravenous Q8H  . sodium chloride  3 mL Intravenous Q12H  . vancomycin  1,000 mg Intravenous Q8H    Infusions: . fentaNYL infusion INTRAVENOUS Stopped (11/23/15 0956)  . heparin 1,200 Units/hr (11/24/15 0853)  . propofol (DIPRIVAN) infusion Stopped (11/22/15 1243)    PRN Medications: sodium chloride, sodium chloride, acetaminophen (TYLENOL) oral liquid 160 mg/5 mL, acetaminophen, fentaNYL (SUBLIMAZE) injection, fentaNYL (SUBLIMAZE) injection, midazolam, sodium chloride   Assessment and Plan:   Cardiac arrest/CAD Left dominant system with diagonal disease. Collaterals noted. Agree with medical management. Global LV dysfunction, likely due to arrest, shocks, CPR. Medical therapy for now. He was not cooled. -increase coreg to 12.'5mg'$  bid  -plavix/ASA -add statin  -will need to add ACEi eventually  -PT   Atrial fibrillation  Monitor showing afib with HR controlled.  Likely provoked by acute illness.  Slight  dizziness this AM but otherwise no symptoms.  Now on heparin, HR controlled.  -increase coreg to 12.'5mg'$  bid -heparin per pharmacy  Anemia Hgb stable.  -monitor  Fever Resolving.  Urine/blood cx NGTD.  CXR atx vs. infiltrate.  Afebrile overnight.  Abx were d/c.   -per PCCM -blood/urine cx NGTD    Jones Bales, MD PGY-3, Internal Medicine Teaching Service 11/24/2015, 10:51 AM   I have examined the patient and reviewed assessment and plan and discussed with patient.  Agree with above as stated.  If AFib persists, would have to consider cardioversion.  WOuld let him try to recover from all of this first.  Mental status favorable today on exam.  I am speaking to him for the first time, but he was coherent.  Filicia Scogin S.

## 2015-11-24 NOTE — Progress Notes (Signed)
PULMONARY / CRITICAL CARE MEDICINE   Name: Daniel Richard MRN: [id number] DOB: Jun 21, 1956    ADMISSION DATE:  11/21/2015 CONSULTATION DATE:  11/21/15  REFERRING MD:  Alvino Chapel  CHIEF COMPLAINT:  S/p cardiac arrest, vtach   HISTORY OF PRESENT ILLNESS:   59yo caucasian male collapsed while at church. Bystander CPR. AED delivered 3 shocks. Epinephrine x 1. Achieved ROSC within 15 minutes approximately. Upon arrival to ED, patient was on epinephrine drip. He was assessed by cardiology and the cath lab team was called in.   SUBJECTIVE:  A-fib overnight, febrile.  VITAL SIGNS: BP 130/79 mmHg  Pulse 101  Temp(Src) 98.4 F (36.9 C) (Oral)  Resp 19  Ht '5\' 11"'$  (1.803 m)  Wt 80.1 kg (176 lb 9.4 oz)  BMI 24.64 kg/m2  SpO2 100%  HEMODYNAMICS:    VENTILATOR SETTINGS:    INTAKE / OUTPUT: I/O last 3 completed shifts: In: 3092.3 [I.V.:578.5; NG/GT:804.8; IV Piggyback:1709] Out: 7085 [Urine:7085]  PHYSICAL EXAMINATION: General:  59yo CM who appears his stated age and is currently intubated and sedated. He appears well nourished and well developed  Neuro: Neuro exam limited 2/2 sedation but reportedly followed command on wake up assessment. He was restrained for safety. Strength was clearly 5/5 on all extremities as the patient required 4 people to hold him down.  HEENT: Head, normocephalic, atraumatic. Ears symmetric, permeable. Eyes: no conjunctival icterus, no erythema. Nose: permeable, midline septum, ETT in place Cardiovascular: S1S2 RRR no murmurs, rubs, or gallops auscultated. No thrills palpated.  Lungs:  Chest symmetrical with respirations, some scattered crackles noted.  Abdomen:  Soft, nontender, no guarding, nondistended. Present bowel sounds. Musculoskeletal:  No muscle atrophy noted nor weakness. No swelling nor tenderness of the joints.  Skin:  No notable scars, rashes, cruises. No bed sores noted.  Lymphatics: no palpable lymphadenopathy of cervical, supraclvicular  nor inguinal areas.   LABS:  BMET  Recent Labs Lab 11/22/15 0901 11/23/15 0455 11/24/15 0444  NA 139 137 138  K 5.1 3.7 3.7  CL 112* 107 101  CO2 '22 25 29  '$ BUN '14 7 7  '$ CREATININE 0.85 0.76 0.78  GLUCOSE 95 103* 112*   Electrolytes  Recent Labs Lab 11/22/15 0344 11/22/15 0901 11/23/15 0455 11/24/15 0444  CALCIUM 7.9* 7.9* 8.3* 8.6*  MG 1.9  --  1.8 2.1  PHOS 2.9  --  2.1* 3.5   CBC  Recent Labs Lab 11/22/15 0344 11/23/15 0455 11/24/15 0444  WBC 13.3* 12.6* 13.7*  HGB 11.6* 11.3* 12.4*  HCT 36.1* 35.3* 37.5*  PLT 193 160 175   Coag's  Recent Labs Lab 11/21/15 1923  APTT 32  INR 1.20   Sepsis Markers  Recent Labs Lab 11/21/15 1943 11/21/15 2252 11/23/15 0455  LATICACIDVEN 9.44* 4.0* 0.9   ABG  Recent Labs Lab 11/22/15 1231 11/23/15 0350 11/24/15 0352  PHART 7.444 7.498* 7.487*  PCO2ART 29.4* 28.9* 36.9  PO2ART 152* 75.3* 96.0   Liver Enzymes No results for input(s): AST, ALT, ALKPHOS, BILITOT, ALBUMIN in the last 168 hours.  Cardiac Enzymes No results for input(s): TROPONINI, PROBNP in the last 168 hours.  Glucose  Recent Labs Lab 11/23/15 1214 11/23/15 1618 11/23/15 2012 11/23/15 2340 11/24/15 0349 11/24/15 0848  GLUCAP 113* 111* 109* 137* 109* 142*   Imaging Dg Chest Port 1 View  11/24/2015  CLINICAL DATA:  Endotracheal tube.  Shortness of breath. EXAM: PORTABLE CHEST 1 VIEW COMPARISON:  11/22/2015; 11/21/2015 ; 11/27/2005 FINDINGS: Grossly unchanged borderline enlarged cardiac silhouette and  mediastinal contours given persistently reduced lung volumes. Interval removal of enteric tube. Interval extubation. Stable position of remaining support apparatus. Slight worsening of bilateral medial basilar heterogeneous opacities. Pulmonary venous congestion without frank evidence of edema. No pleural effusion or pneumothorax. Unchanged bones . IMPRESSION: 1. Interval extubation and removal of enteric tube.  No pneumothorax 2.  Worsening bilateral medial basilar heterogeneous/consolidative opacities, atelectasis versus infiltrate. Continued attention on follow-up is recommended. 3. Pulmonary venous congestion without frank evidence of edema. Electronically Signed   By: Sandi Mariscal M.D.   On: 11/24/2015 07:14   STUDIES:  CXR and EKG reviewed  CULTURES: Blood 12/12>>> Urine 12/12>>> Sputum 12/12>>>  ANTIBIOTICS: Vanc 12/12>>> Zosyn 12/12>>>  SIGNIFICANT EVENTS: Patient going to cath lab  LINES/TUBES: PIV  I reviewed CXR myself, no evidence of acute disease.  DISCUSSION: 59yo CM s/p cardiac arrest with 15 minute downtime, appears to be purposeful in ED although combative, cath lab called in.   ASSESSMENT / PLAN:  PULMONARY A: Acute hypoxemic respiratory failure s/p cardiac arrest, some hypercapnia on ABG P:   - Titrate O2 for sat of 88-92%. - IS per RT protocol.  CARDIOVASCULAR A: Cardiogenic shock on epinephrine s/p cardiac arrest, witnessed, w/15 minute downtime, bystander CPR and 4 AED shocks Original ECG with findings of diffuse ST depression on precordial leads   P:  - Cardiology following, appreciate input. - Titrate pressors to off as able. - Cardiology following and recommending medical management only.  RENAL A:  AKI 2/2 cardiac arrest/ATN, BL creat 1.20 Hypokalemia, 2.7  Lactic acidosis  P:   - BMET in AM. - KVO IVF once BP is more stable. - Replace electrolytes as indicated.  GASTROINTESTINAL A:  No active issues  P:  - GI prophylaxis with famotidine  - Heart healthy diet.  HEMATOLOGIC A:  Leukocytosis, likely reactive  P:  - CBC in AM. - Transfuse per ICU protocol.  INFECTIOUS A:   No active issues  P:   - Monitor WBC and fever curve. - No abx for now.  ENDOCRINE A:  Hyperglycemia, potentially reactive     P:   - CBG and SSI.  NEUROLOGIC A:  Toxic metabolic encephalopathy. Patient was aware and purposefully attempting to get out of the bed and grab  his endotracheal tube   P:   - D/Ced propofol. - D/C fentanyl. - D/C versed.  FAMILY  - Updates: No family bedside.  - Inter-disciplinary family meet or Palliative Care meeting due by:  day 7  Discussed with [facility]-MD, transfer to tele and to [facility] service with PCCM off 12/15.  Rush Farmer, M.D. [facility] Pulmonary/Critical Care Medicine. Pager: [phone]. After hours pager: [phone].

## 2015-11-25 DIAGNOSIS — E876 Hypokalemia: Secondary | ICD-10-CM

## 2015-11-25 DIAGNOSIS — R0782 Intercostal pain: Secondary | ICD-10-CM | POA: Diagnosis present

## 2015-11-25 DIAGNOSIS — J690 Pneumonitis due to inhalation of food and vomit: Secondary | ICD-10-CM | POA: Diagnosis present

## 2015-11-25 LAB — BASIC METABOLIC PANEL
Anion gap: 11 (ref 5–15)
BUN: 10 mg/dL (ref 6–20)
CO2: 26 mmol/L (ref 22–32)
Calcium: 8.8 mg/dL — ABNORMAL LOW (ref 8.9–10.3)
Chloride: 100 mmol/L — ABNORMAL LOW (ref 101–111)
Creatinine, Ser: 0.76 mg/dL (ref 0.61–1.24)
GFR calc Af Amer: >60 mL/min (ref >60)
GFR calc non Af Amer: >60 mL/min (ref >60)
Glucose, Bld: 89 mg/dL (ref 65–99)
Potassium: 3.3 mmol/L — ABNORMAL LOW (ref 3.5–5.1)
Sodium: 137 mmol/L (ref 135–145)

## 2015-11-25 LAB — GLUCOSE, CAPILLARY
Glucose-Capillary: 88 mg/dL (ref 65–99)
Glucose-Capillary: 107 mg/dL — ABNORMAL HIGH (ref 65–99)
Glucose-Capillary: 154 mg/dL — ABNORMAL HIGH (ref 65–99)
Glucose-Capillary: 100 mg/dL — ABNORMAL HIGH (ref 65–99)
Glucose-Capillary: 131 mg/dL — ABNORMAL HIGH (ref 65–99)

## 2015-11-25 LAB — CBC
HCT: 35.9 % — ABNORMAL LOW (ref 39.0–52.0)
Hemoglobin: 11.7 g/dL — ABNORMAL LOW (ref 13.0–17.0)
MCH: 31.2 pg (ref 26.0–34.0)
MCHC: 32.6 g/dL (ref 30.0–36.0)
MCV: 95.7 fL (ref 78.0–100.0)
Platelets: 200 10*3/uL (ref 150–400)
RBC: 3.75 MIL/uL — ABNORMAL LOW (ref 4.22–5.81)
RDW: 13 % (ref 11.5–15.5)
WBC: 11.6 10*3/uL — ABNORMAL HIGH (ref 4.0–10.5)

## 2015-11-25 LAB — PHOSPHORUS: Phosphorus: 3.8 mg/dL (ref 2.5–4.6)

## 2015-11-25 LAB — MAGNESIUM: Magnesium: 1.9 mg/dL (ref 1.7–2.4)

## 2015-11-25 LAB — HEPARIN LEVEL (UNFRACTIONATED): Heparin Unfractionated: 0.13 IU/mL — ABNORMAL LOW (ref 0.30–0.70)

## 2015-11-25 MED ORDER — KETOROLAC TROMETHAMINE 30 MG/ML IJ SOLN
30.0000 mg | Freq: Four times a day (QID) | INTRAMUSCULAR | Status: DC | PRN
  Administered 2015-11-25 – 2015-11-26 (×2): 30 mg via INTRAVENOUS
  Filled 2015-11-25 (×2): qty 1

## 2015-11-25 MED ORDER — POTASSIUM CHLORIDE CRYS ER 20 MEQ PO TBCR
40.0000 meq | EXTENDED_RELEASE_TABLET | Freq: Once | ORAL | Status: AC
  Administered 2015-11-25: 40 meq via ORAL
  Filled 2015-11-25: qty 2

## 2015-11-25 MED ORDER — APIXABAN 5 MG PO TABS
5.0000 mg | ORAL_TABLET | Freq: Two times a day (BID) | ORAL | Status: DC
  Administered 2015-11-25 – 2015-11-30 (×11): 5 mg via ORAL
  Filled 2015-11-25 (×11): qty 1

## 2015-11-25 MED ORDER — TRAMADOL HCL 50 MG PO TABS
50.0000 mg | ORAL_TABLET | Freq: Once | ORAL | Status: AC
  Administered 2015-11-25: 50 mg via ORAL
  Filled 2015-11-25: qty 1

## 2015-11-25 MED ORDER — LEVOFLOXACIN IN D5W 750 MG/150ML IV SOLN
750.0000 mg | INTRAVENOUS | Status: DC
Start: 2015-11-25 — End: 2015-11-27
  Administered 2015-11-25 – 2015-11-26 (×2): 750 mg via INTRAVENOUS
  Filled 2015-11-25 (×4): qty 150

## 2015-11-25 MED ORDER — HEPARIN (PORCINE) IN NACL 100-0.45 UNIT/ML-% IJ SOLN: 1500.0000 [IU]/h | INTRAMUSCULAR | Status: DC

## 2015-11-25 MED ORDER — ROSUVASTATIN CALCIUM 10 MG PO TABS
40.0000 mg | ORAL_TABLET | Freq: Every day | ORAL | Status: DC
  Administered 2015-11-25 – 2015-11-29 (×5): 40 mg via ORAL
  Filled 2015-11-25 (×6): qty 4

## 2015-11-25 NOTE — Discharge Instructions (Addendum)
Information on my medicine - ELIQUIS (apixaban)  Why was Eliquis prescribed for you? Eliquis was prescribed for you to reduce the risk of a blood clot forming that can cause a stroke if you have a medical condition called atrial fibrillation (a type of irregular heartbeat).  What do You need to know about Eliquis ? Take your Eliquis TWICE DAILY - one tablet in the morning and one tablet in the evening with or without food. If you have difficulty swallowing the tablet whole please discuss with your pharmacist how to take the medication safely.  Take Eliquis exactly as prescribed by your doctor and DO NOT stop taking Eliquis without talking to the doctor who prescribed the medication.  Stopping may increase your risk of developing a stroke.  Refill your prescription before you run out.  After discharge, you should have regular check-up appointments with your healthcare provider that is prescribing your Eliquis.  In the future your dose may need to be changed if your kidney function or weight changes by a significant amount or as you get older.  What do you do if you miss a dose? If you miss a dose, take it as soon as you remember on the same day and resume taking twice daily.  Do not take more than one dose of ELIQUIS at the same time to make up a missed dose.  Important Safety Information A possible side effect of Eliquis is bleeding. You should call your healthcare provider right away if you experience any of the following: ? Bleeding from an injury or your nose that does not stop. ? Unusual colored urine (red or dark brown) or unusual colored stools (red or black). ? Unusual bruising for unknown reasons. ? A serious fall or if you hit your head (even if there is no bleeding).  Some medicines may interact with Eliquis and might increase your risk of bleeding or clotting while on Eliquis. To help avoid this, consult your healthcare provider or pharmacist prior to using any new  prescription or non-prescription medications, including herbals, vitamins, non-steroidal anti-inflammatory drugs (NSAIDs) and supplements.  This website has more information on Eliquis (apixaban): http://www.eliquis.com/eliquis/home      Supplemental Discharge Instructions for  Pacemaker/Defibrillator Patients  Activity No heavy lifting or vigorous activity with your left arm for 6 to 8 weeks.  Do not raise your left arm above your head for one week.  Gradually raise your affected arm as drawn below.             12/04/15                   12/05/15                  12/06/15                 12/07/15 __  NO DRIVING for  6 months     WOUND CARE - Keep the wound area clean and dry.  Do not get this area wet for one day. You may shower on 11/30/15  . - The tape/steri-strips on your wound will fall off; do not pull them off.  No bandage is needed on the site.  DO  NOT apply any creams, oils, or ointments to the wound area. - If you notice any drainage or discharge from the wound, any swelling or bruising at the site, or you develop a fever > 101? F after you are discharged home, call the office at once.  Special  Instructions - You are still able to use cellular telephones; use the ear opposite the side where you have your pacemaker/defibrillator.  Avoid carrying your cellular phone near your device. - When traveling through airports, show security personnel your identification card to avoid being screened in the metal detectors.  Ask the security personnel to use the hand wand. - Avoid arc welding equipment, MRI testing (magnetic resonance imaging), TENS units (transcutaneous nerve stimulators).  Call the office for questions about other devices. - Avoid electrical appliances that are in poor condition or are not properly grounded. - Microwave ovens are safe to be near or to operate.  Additional information for defibrillator patients should your device go off: - If your device goes off  ONCE and you feel fine afterward, notify the device clinic nurses. - If your device goes off ONCE and you do not feel well afterward, call 911. - If your device goes off TWICE, call 911. - If your device goes off THREE times in one day, call 911.  DO NOT DRIVE YOURSELF OR A FAMILY MEMBER WITH A DEFIBRILLATOR TO THE HOSPITAL--CALL 911.

## 2015-11-25 NOTE — Progress Notes (Signed)
MD on call notified of pt c/o of back & rib pain. New orders given. MD also notified of pt no longer being on the continuous  Fentanyl nor propofol drip since transferring from the ICU. Will continue to monitor the pt. Hoover Brunette, RN

## 2015-11-25 NOTE — Progress Notes (Signed)
ANTICOAGULATION CONSULT NOTE - Follow up  Pharmacy Consult for Heparin  Indication: atrial fibrillation  Patient Measurements: Height: '5\' 11"'$  (180.3 cm) Weight: 172 lb 4.8 oz (78.155 kg) IBW/kg (Calculated) : 75.3 Heparin Dosing Weight: 83 kg   Vital Signs: Temp: 98.5 F (36.9 C) (12/15 0546) Temp Source: Oral (12/15 0546) BP: 114/76 mmHg (12/15 0546) Pulse Rate: 53 (12/15 0546)  Labs:  Recent Labs  11/23/15 0455 11/23/15 1810 11/24/15 0444 11/25/15 0403  HGB 11.3*  --  12.4* 11.7*  HCT 35.3*  --  37.5* 35.9*  PLT 160  --  175 200  HEPARINUNFRC  --  0.40 0.37 0.13*  CREATININE 0.76  --  0.78 0.76     Assessment: 75 YOM who is s/p PCI for STEMI. Pt on heparin for afib. Heparin level 0.13 (subtherapeutic) on 1200 units/hr. No issues with line or bleeding reported per RN. CBC stable.  Goal of Therapy:  Heparin level 0.3-0.7 units/ml Monitor platelets by anticoagulation protocol: Yes   Plan:  Increase heparin to 1500 units/hr  F/u 6 hr heparin level  Sherlon Handing, PharmD, BCPS Clinical pharmacist, pager [phone] 11/25/2015 6:15 AM

## 2015-11-25 NOTE — Progress Notes (Signed)
PT Cancellation Note  Patient Details Name: Daniel Richard MRN: [id number] DOB: Aug 23, 1956   Cancelled Treatment:    Reason Eval/Treat Not Completed:  (Declined over no sleep last night.)  Refusing pain meds for CPR-related chest pain.   Ramond Dial 11/25/2015, 8:44 AM   Mee Hives, PT MS Acute Rehab Dept. Number: [facility] [id number] and [place] [phone]

## 2015-11-25 NOTE — Plan of Care (Signed)
Problem: Consults Goal: Diabetes Guidelines if Diabetic/Glucose > 140 If diabetic or lab glucose is > 140 mg/dl - Initiate Diabetes/Hyperglycemia Guidelines & Document Interventions  Outcome: Progressing Pt's glucose has not been over 140 for this shift  Problem: Phase I Progression Outcomes Goal: Oral Care per Protocol Outcome: Completed/Met Date Met:  11/25/15 Pt is extubated

## 2015-11-25 NOTE — Progress Notes (Signed)
Subjective:    Day of hospitalization: 4   Pt p/w witnessed out of hospital cardiac arrest. CPR was performed followed by defibrillation by EMS. EKG showed possible subtle anterolateral ST elevation. LHC with significant branches disease with occlusion of first diagonal likely culprit for vfib. However, vessel is 1.31m in diameter and is diffusely diseased but has collaterals. No obstruction on main vessels. Recommend medical mgmt.   Pt extubated 11/23/15 and doing well. Having some chest soreness from the compressions. Up walking around. Looks great   Objective:   Temp:  [98.2 F (36.8 C)-98.9 F (37.2 C)] 98.5 F (36.9 C) (12/15 0546) Pulse Rate:  [53-120] 97 (12/15 1002) Resp:  [19-29] 20 (12/15 0546) BP: (110-136)/(69-99) 114/76 mmHg (12/15 1002) SpO2:  [91 %-95 %] 95 % (12/15 0546) Weight:  [172 lb 4.8 oz (78.155 kg)-172 lb 6.4 oz (78.2 kg)] 172 lb 4.8 oz (78.155 kg) (12/15 0546) Last BM Date: 11/22/15  Filed Weights   11/24/15 0444 11/24/15 1545 11/25/15 0546  Weight: 176 lb 9.4 oz (80.1 kg) 172 lb 6.4 oz (78.2 kg) 172 lb 4.8 oz (78.155 kg)    Intake/Output Summary (Last 24 hours) at 11/25/15 1009 Last data filed at 11/24/15 1800  Gross per 24 hour  Intake    490 ml  Output      0 ml  Net    490 ml    Telemetry: AFib  Physical Exam: General: NAD, opens eyes and follows commands.  Lungs: CTAB. Cardiac: tachycardic, no m/r/g. Abdomen: +BS, NT/ND.   Extremities: No LE edema. Radial cath site without hematoma. Neuro: Sedated but responds to commands.  Lab Results:  Basic Metabolic Panel:  Recent Labs Lab 11/23/15 0455 11/24/15 0444 11/25/15 0403  NA 137 138 137  K 3.7 3.7 3.3*  CL 107 101 100*  CO2 '25 29 26  '$ GLUCOSE 103* 112* 89  BUN '7 7 10  '$ CREATININE 0.76 0.78 0.76  CALCIUM 8.3* 8.6* 8.8*  MG 1.8 2.1 1.9    Liver Function Tests: No results for input(s): AST, ALT, ALKPHOS, BILITOT, PROT, ALBUMIN in the last 168 hours.  CBC:  Recent  Labs Lab 11/23/15 0455 11/24/15 0444 11/25/15 0403  WBC 12.6* 13.7* 11.6*  HGB 11.3* 12.4* 11.7*  HCT 35.3* 37.5* 35.9*  MCV 96.2 95.7 95.7  PLT 160 175 200    Cardiac Enzymes: No results for input(s): CKTOTAL, CKMB, CKMBINDEX, TROPONINI in the last 168 hours.  BNP: No results for input(s): PROBNP in the last 8760 hours.  Coagulation:  Recent Labs Lab 11/21/15 1923  INR 1.20    Radiology: Dg Chest Port 1 View  11/24/2015  CLINICAL DATA:  Endotracheal tube.  Shortness of breath. EXAM: PORTABLE CHEST 1 VIEW COMPARISON:  11/22/2015; 11/21/2015 ; 11/27/2005 FINDINGS: Grossly unchanged borderline enlarged cardiac silhouette and mediastinal contours given persistently reduced lung volumes. Interval removal of enteric tube. Interval extubation. Stable position of remaining support apparatus. Slight worsening of bilateral medial basilar heterogeneous opacities. Pulmonary venous congestion without frank evidence of edema. No pleural effusion or pneumothorax. Unchanged bones . IMPRESSION: 1. Interval extubation and removal of enteric tube.  No pneumothorax 2. Worsening bilateral medial basilar heterogeneous/consolidative opacities, atelectasis versus infiltrate. Continued attention on follow-up is recommended. 3. Pulmonary venous congestion without frank evidence of edema. Electronically Signed   By: JSandi MariscalM.D.   On: 11/24/2015 07:14      Medications:   Scheduled Medications: . antiseptic oral rinse  7 mL Mouth Rinse BID  .  aspirin  81 mg Oral Daily  . carvedilol  12.5 mg Oral BID WC  . clopidogrel  75 mg Oral Q breakfast  . famotidine  20 mg Oral BID  . insulin aspart  2-6 Units Subcutaneous 6 times per day  . piperacillin-tazobactam (ZOSYN)  IV  3.375 g Intravenous Q8H  . sodium chloride  3 mL Intravenous Q12H    Infusions: . heparin 1,500 Units/hr (11/25/15 0620)    PRN Medications: sodium chloride, sodium chloride, acetaminophen (TYLENOL) oral liquid 160 mg/5 mL,  acetaminophen, midazolam, sodium chloride   Assessment and Plan:   Cardiac arrest/CAD Left dominant system with diagonal disease. Collaterals noted. Agree with medical management. Global LV dysfunction, likely due to arrest, shocks, CPR. Medical therapy for now. He was not cooled. - 2D ECHO with normal LVEF and no WMAs - continue coreg to 12.'5mg'$  bid. Will check baseline LFTs/baseline and add statin . Does not tolerate Lipitor. Will add rosuvastatin '40mg'$  - continue plavix/ASA -will need to add ACEi eventually, but don't want to add now and we need to use AV nodal blocking agents to control afib  -PT   Atrial fibrillation  Monitor showing afib with HRs in 100-120s. Likely provoked by acute illness. Asymptomatic.  -continue coreg to 12.'5mg'$  bid -heparin per pharmacy. CHADSVASC 2 (HTN, CAD). Continue to monitor for now. May need to go home on a NOAC with plans for DCCV in 3-4 weeks if still in afib vs TEE/DCCV here.   Fever Resolved.   Eileen Stanford, PA-C  11/25/2015, 10:09 AM    I have examined the patient and reviewed assessment and plan and discussed with patient.  Agree with above as stated.  Discussed options for AFib.  He prefers switching to NOAC ad then DCCV in 3-4 weeks if still in AFib, rather than TEE/CV.  Heparin stopped.  Rate control with beta blocker.  Carvedilol increased yesterday. Holding amlodipine at this time.  If BP stays high, could try an ACE-I.  Eliquis starting today.  From cardiac standpoint, will be ok to go when rate controlled adequately.  ABx per primary team.   Edel Rivero S.

## 2015-11-25 NOTE — Progress Notes (Signed)
[facility] Progress Note Patient Name: DESEAN HEEMSTRA DOB: 08-10-1956 MRN: [id number]   Date of Service  11/25/2015  HPI/Events of Note  Pain in chest and back from cpr  eICU Interventions  Ultram '50mg'$  x 1 Dc diprivan and fent orders from ICU     Intervention Category Intermediate Interventions: Pain - evaluation and management  Romone Shaff 11/25/2015, 2:53 AM

## 2015-11-25 NOTE — Progress Notes (Signed)
[facility] PROGRESS NOTE  Daniel Richard WUJ:[id number] DOB: 07/27/1956 DOA: 11/21/2015 PCP: Cathlean Cower, MD  Brief narrative 59 year old male with history of hypertension, hyperlipidemia, anxiety, low back pain who collapsed while playing piano at church. Performed CPR by bystanders. Received 3 shocks by AED and does of epinephrine. Achieved ROS see within about 15 minutes. Patient was intubated and on epinephrine drip upon arrival to the ED. EKG showed possible anteriolateral ST elevation. Patient taken to cardiac catheterization which showed significant branches disease with occlusion of first diagonal likely contributed into V. fib. Vessel had collaterals with no obstruction on main vessels. Also found to be in A. fib and started on IV heparin. Patient extubated on 12/13 and transferred to hospitalist service on 12/15.  Assessment/Plan: Cardiac arrest Possibly secondary to V. fib with finding of diagnosis vessel disease on cardiac cath with global LV dysfunction.. has collaterals and cardiology recommends medical management. The echo showed normal EF with no wall motion abnormality. Currently hemodynamically stable. On Coreg 12.5 mg twice a day. placed on Crestor. Check lipid panel in a.m. Continue Plavix. -PT evaluation. -Patient reports significant soreness on his chest (secondary to CPR and shocks). He is hesitant to use narcotics as he is scared of falling asleep and getting another cardiac arrest. I will put him on when necessary Toradol alternating with Tylenol.  Atrial fibrillation Heart rate in low 100s. Asymptomatic. Continue Coreg. on IV heparin.CHADSVASC 2 (HTN, CAD). Anti-correlation options discussed by cardiology and patient prefers NOAC. Switched to [employer]. Plan on DC cardioversion in 3-4 weeks if Afib persists.  AKI/lactic acidosis Possibly secondary to Cardiogenic  shock. Improved with IV hydration.  hypokalemia Replenished. Normal mg  ? Aspiration  pneumonia On empiric vancomycin and Zosyn since 12/12. Cultures negative. Will transition to [place].  Toxic metabolic encephalopathy Resolved  DVT prophylaxis Eliquis  Diet: Heart healthy   Code Status: full code Family Communication: none at bedside Disposition Plan:home possibly in next 1-2 days if HR and BP stable   Consultants:  PC CM  Cardiology  Procedures:  Intubation   Left heart cath  2d echo    Antibiotics:  IV vanco and zosyn 12/12--12/15  levaquin 12/15--  HPI/Subjective:  Seen and examined. Reports soreness in the chest.  Objective: Filed Vitals:   11/25/15 0546 11/25/15 1002  BP: 114/76 114/76  Pulse: 53 97  Temp: 98.5 F (36.9 C)   Resp: 20     Intake/Output Summary (Last 24 hours) at 11/25/15 1342 Last data filed at 11/24/15 1800  Gross per 24 hour  Intake    240 ml  Output      0 ml  Net    240 ml   Filed Weights   11/24/15 0444 11/24/15 1545 11/25/15 0546  Weight: 80.1 kg (176 lb 9.4 oz) 78.2 kg (172 lb 6.4 oz) 78.155 kg (172 lb 4.8 oz)    Exam:   General:  Middle aged man not in distress  HEENT: No pallor, moist oral mucosa  Chest: Clear to auscultation bilaterally, tenderness to pressure over anterior chest  Cardiovascular:  S1 and S2 is regular, no murmurs rub or gallop  GI: Soft, nondistended, nontender, bowel sounds present  Musculoskeletal:  Warm, no edema  CNS: Alert and oriented   Data Reviewed: Basic Metabolic Panel:  Recent Labs Lab 11/22/15 0005 11/22/15 0344 11/22/15 0901 11/23/15 0455 11/24/15 0444 11/25/15 0403  NA 141 139 139 137 138 137  K 3.2* 5.0 5.1 3.7 3.7 3.3*  CL 111 110 112*  107 101 100*  CO2 19* 21* '22 25 29 26  '$ GLUCOSE 148* 138* 95 103* 112* 89  BUN '14 14 14 7 7 10  '$ CREATININE 1.05 0.95 0.85 0.76 0.78 0.76  CALCIUM 7.3* 7.9* 7.9* 8.3* 8.6* 8.8*  MG 2.3 1.9  --  1.8 2.1 1.9  PHOS 2.6 2.9  --  2.1* 3.5 3.8   Liver Function Tests: No results for input(s): AST, ALT,  ALKPHOS, BILITOT, PROT, ALBUMIN in the last 168 hours. No results for input(s): LIPASE, AMYLASE in the last 168 hours. No results for input(s): AMMONIA in the last 168 hours. CBC:  Recent Labs Lab 11/21/15 1923 11/21/15 1942 11/22/15 0344 11/23/15 0455 11/24/15 0444 11/25/15 0403  WBC 20.8*  --  13.3* 12.6* 13.7* 11.6*  HGB 14.5 16.7 11.6* 11.3* 12.4* 11.7*  HCT 44.4 49.0 36.1* 35.3* 37.5* 35.9*  MCV 98.2  --  96.3 96.2 95.7 95.7  PLT 258  --  193 160 175 200   Cardiac Enzymes: No results for input(s): CKTOTAL, CKMB, CKMBINDEX, TROPONINI in the last 168 hours. BNP (last 3 results) No results for input(s): BNP in the last 8760 hours.  ProBNP (last 3 results) No results for input(s): PROBNP in the last 8760 hours.  CBG:  Recent Labs Lab 11/24/15 2053 11/24/15 2344 11/25/15 0427 11/25/15 0727 11/25/15 1133  GLUCAP 86 116* 88 107* 154*    Recent Results (from the past 240 hour(s))  MRSA PCR Screening     Status: None   Collection Time: 11/22/15  2:14 AM  Result Value Ref Range Status   MRSA by PCR NEGATIVE NEGATIVE Final    Comment:        The GeneXpert MRSA Assay (FDA approved for NASAL specimens only), is one component of a comprehensive MRSA colonization surveillance program. It is not intended to diagnose MRSA infection nor to guide or monitor treatment for MRSA infections.   Culture, blood (routine x 2)     Status: None (Preliminary result)   Collection Time: 11/22/15  6:30 PM  Result Value Ref Range Status   Specimen Description BLOOD RIGHT ANTECUBITAL  Final   Special Requests BOTTLES DRAWN AEROBIC AND ANAEROBIC 10CC  Final   Culture NO GROWTH 2 DAYS  Final   Report Status PENDING  Incomplete  Culture, blood (routine x 2)     Status: None (Preliminary result)   Collection Time: 11/22/15  6:45 PM  Result Value Ref Range Status   Specimen Description BLOOD RIGHT HAND  Final   Special Requests BOTTLES DRAWN AEROBIC AND ANAEROBIC 10CC  Final   Culture  NO GROWTH 2 DAYS  Final   Report Status PENDING  Incomplete  Culture, Urine     Status: None   Collection Time: 11/22/15  6:50 PM  Result Value Ref Range Status   Specimen Description URINE, CATHETERIZED  Final   Special Requests Normal  Final   Culture NO GROWTH 1 DAY  Final   Report Status 11/23/2015 FINAL  Final     Studies: Dg Chest Port 1 View  11/24/2015  CLINICAL DATA:  Endotracheal tube.  Shortness of breath. EXAM: PORTABLE CHEST 1 VIEW COMPARISON:  11/22/2015; 11/21/2015 ; 11/27/2005 FINDINGS: Grossly unchanged borderline enlarged cardiac silhouette and mediastinal contours given persistently reduced lung volumes. Interval removal of enteric tube. Interval extubation. Stable position of remaining support apparatus. Slight worsening of bilateral medial basilar heterogeneous opacities. Pulmonary venous congestion without frank evidence of edema. No pleural effusion or pneumothorax. Unchanged bones . IMPRESSION:  1. Interval extubation and removal of enteric tube.  No pneumothorax 2. Worsening bilateral medial basilar heterogeneous/consolidative opacities, atelectasis versus infiltrate. Continued attention on follow-up is recommended. 3. Pulmonary venous congestion without frank evidence of edema. Electronically Signed   By: Sandi Mariscal M.D.   On: 11/24/2015 07:14    Scheduled Meds: . antiseptic oral rinse  7 mL Mouth Rinse BID  . apixaban  5 mg Oral BID  . carvedilol  12.5 mg Oral BID WC  . clopidogrel  75 mg Oral Q breakfast  . famotidine  20 mg Oral BID  . insulin aspart  2-6 Units Subcutaneous 6 times per day  . piperacillin-tazobactam (ZOSYN)  IV  3.375 g Intravenous Q8H  . rosuvastatin  40 mg Oral q1800  . sodium chloride  3 mL Intravenous Q12H   Continuous Infusions:      Time spent: 25 minutes    Tysha Grismore, [place]  [facility] Pager [phone]. If 7PM-7AM, please contact night-coverage at [email or web address], password [facility] 11/25/2015, 1:42 PM  LOS: 4 days

## 2015-11-26 ENCOUNTER — Inpatient Hospital Stay (HOSPITAL_COMMUNITY): Payer: BLUE CROSS/BLUE SHIELD

## 2015-11-26 DIAGNOSIS — I469 Cardiac arrest, cause unspecified: Secondary | ICD-10-CM

## 2015-11-26 DIAGNOSIS — J690 Pneumonitis due to inhalation of food and vomit: Secondary | ICD-10-CM

## 2015-11-26 LAB — CBC
HCT: 37.1 % — ABNORMAL LOW (ref 39.0–52.0)
Hemoglobin: 12.3 g/dL — ABNORMAL LOW (ref 13.0–17.0)
MCH: 31.8 pg (ref 26.0–34.0)
MCHC: 33.2 g/dL (ref 30.0–36.0)
MCV: 95.9 fL (ref 78.0–100.0)
Platelets: 234 10*3/uL (ref 150–400)
RBC: 3.87 MIL/uL — ABNORMAL LOW (ref 4.22–5.81)
RDW: 12.8 % (ref 11.5–15.5)
WBC: 10.2 10*3/uL (ref 4.0–10.5)

## 2015-11-26 LAB — GLUCOSE, CAPILLARY
Glucose-Capillary: 110 mg/dL — ABNORMAL HIGH (ref 65–99)
Glucose-Capillary: 115 mg/dL — ABNORMAL HIGH (ref 65–99)
Glucose-Capillary: 91 mg/dL (ref 65–99)
Glucose-Capillary: 91 mg/dL (ref 65–99)
Glucose-Capillary: 95 mg/dL (ref 65–99)
Glucose-Capillary: 96 mg/dL (ref 65–99)

## 2015-11-26 MED ORDER — DILTIAZEM HCL 30 MG PO TABS: 30.0000 mg | ORAL_TABLET | Freq: Four times a day (QID) | ORAL | Status: DC

## 2015-11-26 MED ORDER — CARVEDILOL 25 MG PO TABS
25.0000 mg | ORAL_TABLET | Freq: Two times a day (BID) | ORAL | Status: DC
Start: 2015-11-26 — End: 2015-11-30
  Administered 2015-11-26 – 2015-11-30 (×8): 25 mg via ORAL
  Filled 2015-11-26 (×8): qty 1

## 2015-11-26 NOTE — Progress Notes (Signed)
[facility] PROGRESS NOTE  Daniel Richard MBW:[id number] DOB: 1956/12/08 DOA: 11/21/2015 PCP: Cathlean Cower, MD  Brief narrative 59 year old male with history of hypertension, hyperlipidemia, anxiety, low back pain who collapsed while playing piano at church. Performed CPR by bystanders. Received 3 shocks by AED and does of epinephrine. Achieved ROS see within about 15 minutes. Patient was intubated and on epinephrine drip upon arrival to the ED. EKG showed possible anteriolateral ST elevation. Patient taken to cardiac catheterization which showed significant branches disease with occlusion of first diagonal likely contributed into V. fib. Vessel had collaterals with no obstruction on main vessels. Also found to be in A. fib and started on IV heparin. Patient extubated on 12/13 and transferred to hospitalist service on 12/15.  Assessment/Plan: Cardiac arrest Possibly secondary to V. fib with finding of diagnosis vessel disease on cardiac cath with global LV dysfunction.. has collaterals and cardiology recommends medical management. The echo showed normal EF with no wall motion abnormality. Currently hemodynamically stable. On Coreg 12.5 mg twice a day. placed on Crestor. Check lipid panel in a.m. Continue Plavix. -PT evaluation. -Chest soreness improved with when necessary Toradol.  Atrial fibrillation Heart rate 100-120s. Asymptomatic. Increase Coreg to 25 mg twice daily to better rate control.. on IV heparin.CHADSVASC 2 (HTN, CAD). anticoagulations options discussed by cardiology and patient prefers NOAC. Switched to [employer]. Plan on DC cardioversion in 3-4 weeks if Afib persists.  AKI/lactic acidosis Possibly secondary to Cardiogenic  shock. Improved with IV hydration.  hypokalemia Replenished. Normal mg  ? Aspiration pneumonia On empiric vancomycin and Zosyn since 12/12. Cultures negative.  transitioned to Levaquin.  Toxic metabolic encephalopathy Resolved  DVT  prophylaxis Eliquis  Diet: Heart healthy   Code Status: full code Family Communication: none at bedside Disposition Plan:home in a.m. if heart rate stable   Consultants:  PC CM  Cardiology  Procedures:  Intubation   Left heart cath  2d echo    Antibiotics:  IV vanco and zosyn 12/12--12/15  levaquin 12/15--  HPI/Subjective:  Seen and examined. Chest shortness much improved. Heart rate in 120s on the monitor  Objective: Filed Vitals:   11/25/15 2047 11/26/15 0547  BP: 114/71 143/95  Pulse: 84 101  Temp: 98.4 F (36.9 C) 98.1 F (36.7 C)  Resp: 16 18    Intake/Output Summary (Last 24 hours) at 11/26/15 1134 Last data filed at 11/26/15 0816  Gross per 24 hour  Intake    600 ml  Output      0 ml  Net    600 ml   Filed Weights   11/24/15 1545 11/25/15 0546 11/26/15 0547  Weight: 78.2 kg (172 lb 6.4 oz) 78.155 kg (172 lb 4.8 oz) 78.518 kg (173 lb 1.6 oz)    Exam:   General:   not in distress  HEENT:moist oral mucosa  Chest: Clear to auscultation bilaterally, intimal tenderness to pressure over anterior chest  Cardiovascular:  S1 and S2 irregular, no murmurs rub or gallop  GI: Soft, nondistended, nontender, bowel sounds present  Musculoskeletal:  Warm, no edema    Data Reviewed: Basic Metabolic Panel:  Recent Labs Lab 11/22/15 0005 11/22/15 0344 11/22/15 0901 11/23/15 0455 11/24/15 0444 11/25/15 0403  NA 141 139 139 137 138 137  K 3.2* 5.0 5.1 3.7 3.7 3.3*  CL 111 110 112* 107 101 100*  CO2 19* 21* '22 25 29 26  '$ GLUCOSE 148* 138* 95 103* 112* 89  BUN '14 14 14 7 7 10  '$ CREATININE 1.05 0.95  0.85 0.76 0.78 0.76  CALCIUM 7.3* 7.9* 7.9* 8.3* 8.6* 8.8*  MG 2.3 1.9  --  1.8 2.1 1.9  PHOS 2.6 2.9  --  2.1* 3.5 3.8   Liver Function Tests: No results for input(s): AST, ALT, ALKPHOS, BILITOT, PROT, ALBUMIN in the last 168 hours. No results for input(s): LIPASE, AMYLASE in the last 168 hours. No results for input(s): AMMONIA in the last  168 hours. CBC:  Recent Labs Lab 11/22/15 0344 11/23/15 0455 11/24/15 0444 11/25/15 0403 11/26/15 0415  WBC 13.3* 12.6* 13.7* 11.6* 10.2  HGB 11.6* 11.3* 12.4* 11.7* 12.3*  HCT 36.1* 35.3* 37.5* 35.9* 37.1*  MCV 96.3 96.2 95.7 95.7 95.9  PLT 193 160 175 200 234   Cardiac Enzymes: No results for input(s): CKTOTAL, CKMB, CKMBINDEX, TROPONINI in the last 168 hours. BNP (last 3 results) No results for input(s): BNP in the last 8760 hours.  ProBNP (last 3 results) No results for input(s): PROBNP in the last 8760 hours.  CBG:  Recent Labs Lab 11/25/15 1946 11/26/15 0014 11/26/15 0349 11/26/15 0713 11/26/15 1112  GLUCAP 131* 95 91 96 110*    Recent Results (from the past 240 hour(s))  MRSA PCR Screening     Status: None   Collection Time: 11/22/15  2:14 AM  Result Value Ref Range Status   MRSA by PCR NEGATIVE NEGATIVE Final    Comment:        The GeneXpert MRSA Assay (FDA approved for NASAL specimens only), is one component of a comprehensive MRSA colonization surveillance program. It is not intended to diagnose MRSA infection nor to guide or monitor treatment for MRSA infections.   Culture, blood (routine x 2)     Status: None (Preliminary result)   Collection Time: 11/22/15  6:30 PM  Result Value Ref Range Status   Specimen Description BLOOD RIGHT ANTECUBITAL  Final   Special Requests BOTTLES DRAWN AEROBIC AND ANAEROBIC 10CC  Final   Culture NO GROWTH 4 DAYS  Final   Report Status PENDING  Incomplete  Culture, blood (routine x 2)     Status: None (Preliminary result)   Collection Time: 11/22/15  6:45 PM  Result Value Ref Range Status   Specimen Description BLOOD RIGHT HAND  Final   Special Requests BOTTLES DRAWN AEROBIC AND ANAEROBIC 10CC  Final   Culture NO GROWTH 4 DAYS  Final   Report Status PENDING  Incomplete  Culture, Urine     Status: None   Collection Time: 11/22/15  6:50 PM  Result Value Ref Range Status   Specimen Description URINE,  CATHETERIZED  Final   Special Requests Normal  Final   Culture NO GROWTH 1 DAY  Final   Report Status 11/23/2015 FINAL  Final     Studies: No results found.  Scheduled Meds: . antiseptic oral rinse  7 mL Mouth Rinse BID  . apixaban  5 mg Oral BID  . carvedilol  25 mg Oral BID WC  . clopidogrel  75 mg Oral Q breakfast  . famotidine  20 mg Oral BID  . insulin aspart  2-6 Units Subcutaneous 6 times per day  . levofloxacin (LEVAQUIN) IV  750 mg Intravenous Q24H  . rosuvastatin  40 mg Oral q1800  . sodium chloride  3 mL Intravenous Q12H   Continuous Infusions:      Time spent: 25 minutes    Cherly Erno, [place]  [facility] Pager [phone]. If 7PM-7AM, please contact night-coverage at [email or web address], password [facility] 11/26/2015, 11:34 AM  LOS: 5 days

## 2015-11-26 NOTE — Care Management Note (Addendum)
Case Management Note  Patient Details  Name: Daniel Richard MRN: [id number] Date of Birth: 10/11/1956  Subjective/Objective:  Pt admitted for cardiac arrest. Plan will be for d/c home.                   Action/Plan: Eliquis 30 day free/ co pay card given to staff RN Gwen to provide to pt- Pt in MRI at the time. CO pay will be $50.00 and if he activates the $10.00 co pay card cost may decrease to $10.00. RN to provide information to patient. No further needs from CM at this time.    Expected Discharge Date:                  Expected Discharge Plan:  Home/Self Care  In-House Referral:  NA  Discharge planning Services  CM Consult, Medication Assistance  Post Acute Care Choice:  NA Choice offered to:  NA  DME Arranged:  N/A DME Agency:  NA  HH Arranged:  NA HH Agency:  NA  Status of Service:  Completed, signed off  Medicare Important Message Given:    Date Medicare IM Given:    Medicare IM give by:    Date Additional Medicare IM Given:    Additional Medicare Important Message give by:     If discussed at [place] of Stay Meetings, dates discussed:    Additional Comments: 1154 11-30-15 Jacqlyn Krauss, RN,BSN [phone] Pt ambulating independently. No needs for Outpatient PT at this time.   Bethena Roys, RN 11/26/2015, 4:45 PM

## 2015-11-26 NOTE — Evaluation (Signed)
Physical Therapy Evaluation Patient Details Name: Daniel Richard MRN: [id number] DOB: 07/20/1956 Today's Date: 11/26/2015   History of Present Illness  59 yo male with onset of MI with CPR and shock, now having ribcage pain.  now with lactic acidosis, AKI, a-fib and resolved toxic metabolic encephalopathy, hypokalemia.  Clinical Impression  Pt was able to walk and tolerate activity with controlled pulses today, despite initial high values.  His plan is to cover the endurance changes with therapy and PT recommended cardiac rehab to control the treatments.  He will have HHPT as a fall back if the insurance will not approve the rehab as requested.    Follow Up Recommendations Outpatient PT;Other (comment) (for cardiac rehab to restore endurance with HR and sats)    Equipment Recommendations  None recommended by PT    Recommendations for Other Services       Precautions / Restrictions Precautions Precautions: Fall (telemetry, monitoring pulses) Restrictions Weight Bearing Restrictions: No      Mobility  Bed Mobility Overal bed mobility: Modified Independent                Transfers Overall transfer level: Modified independent Equipment used: None                Ambulation/Gait Ambulation/Gait assistance: Supervision Ambulation Distance (Feet): 200 Feet Assistive device: None Gait Pattern/deviations: Step-through pattern;Wide base of support Gait velocity: reduced and controlled Gait velocity interpretation: Below normal speed for age/gender General Gait Details: verbally asked pt to control his pace so as not to stress HR since his pulses were running high today per nsg  Stairs            Wheelchair Mobility    Modified Rankin (Stroke Patients Only)       Balance Overall balance assessment: No apparent balance deficits (not formally assessed)                                           Pertinent Vitals/Pain Pain Assessment:  Faces Faces Pain Scale: Hurts little more Pain Location: ribcage Pain Descriptors / Indicators: Aching;Guarding Pain Intervention(s): Monitored during session;Premedicated before session    Home Living Family/patient expects to be discharged to:: Private residence Living Arrangements: Spouse/significant other;Children Available Help at Discharge: Family;Available PRN/intermittently Type of Home: House       Home Layout: Two level;Able to live on main level with bedroom/bathroom Home Equipment: None      Prior Function Level of Independence: Independent               Hand Dominance        Extremity/Trunk Assessment   Upper Extremity Assessment: Overall WFL for tasks assessed           Lower Extremity Assessment: Overall WFL for tasks assessed      Cervical / Trunk Assessment: Normal  Communication   Communication: No difficulties  Cognition Arousal/Alertness: Awake/alert Behavior During Therapy: WFL for tasks assessed/performed Overall Cognitive Status: Within Functional Limits for tasks assessed                      General Comments General comments (skin integrity, edema, etc.): HR with O2 sats checked and was 116 pulse and sat 93% initially, now 89% and pulse 82 after walking.  He was concerned but O2 sat recovered to 95% after a short rest.  Exercises        Assessment/Plan    PT Assessment Patient needs continued PT services  PT Diagnosis Difficulty walking   PT Problem List Cardiopulmonary status limiting activity  PT Treatment Interventions DME instruction;Gait training;Functional mobility training;Therapeutic activities;Therapeutic exercise;Balance training;Neuromuscular re-education;Patient/family education   PT Goals (Current goals can be found in the Care Plan section) Acute Rehab PT Goals Patient Stated Goal: to avoid issues in the future with his heart PT Goal Formulation: With patient Time For Goal Achievement:  12/10/15 Potential to Achieve Goals: Good    Frequency Min 3X/week   Barriers to discharge Decreased caregiver support home alone at times    Co-evaluation               End of Session   Activity Tolerance: Patient tolerated treatment well Patient left: in chair;with call bell/phone within reach;with nursing/sitter in room Nurse Communication: Mobility status         Time: 5498-2641 PT Time Calculation (min) (ACUTE ONLY): 15 min   Charges:   PT Evaluation $Initial PT Evaluation Tier I: 1 Procedure     PT G CodesRamond Dial 04-Dec-2015, 1:59 PM   Mee Hives, PT MS Acute Rehab Dept. Number: [facility] [id number] and [place] [phone]

## 2015-11-26 NOTE — Progress Notes (Signed)
Pt came down for cardiac MRI.  Pt expressed anxiety and that his chest was very tender.  He said he would try the exam.  One set of pictures into the exam pt set the alarm and stated his chest hurt really bad and he was feeling claustrophobic.  I called the RN who said she would try to find out if he could have something, but pt refused further imaging before RN could call back.  Perhaps exam could be retried on Monday as cardiacs are specialized exams and are not performed on the weekends.  Or perhaps pt could be scheduled as an OP.

## 2015-11-26 NOTE — Discharge Summary (Signed)
ELECTROPHYSIOLOGY CONSULT NOTE    Patient ID: Daniel Richard MRN: [id number], DOB/AGE: [birth year] 59 y.o.  Admit date: 11/21/2015 Date of Consult: 11/26/2015  Primary Physician: Cathlean Cower, MD Primary Cardiologist: Irish Lack  Reason for Consultation: cardiac arrest  HPI:  Daniel Richard is a 59 y.o. male with a past medical history significant for hypertension and hyperlipidemia. He collapsed while playing piano at church and received bystander CPR.  EMS was called and AED delivered 3 shocks. He had ROSC within 15 minutes and was taken to [facility] for further evaluation.  He does not remember episode. Cardiac catheterization demonstrated EF 40%, occluded first diagonal with collaterals not amenable to revascularization. He was not cooled.  Echo demonstrated EF 55-60%, no RWMA, trivial MR.   Lab work is notable for [employer] K of 2.7 on arrival, but lab draw within 30 minutes demonstrated K of 3.5.  Mag normal. Lactate elevated but now normalized.  Troponin mildly elevated with flat trend.   He has also developed asymptomatic atrial fibrillation with RVR.  He has been anticoagulated since start of AF.   EP has been asked to evaluate for treatment options.  He currently states that he has persistent chest soreness from CPR.  He denies anginal pain, shortness of breath, LE edema, recent fevers, chills, nausea or vomiting.  He was very active at home walking up to 1.5 miles per day without chest pain, shortness of breath or dizziness. He has not had prior syncope.   Past Medical History  Diagnosis Date  . ALLERGIC RHINITIS   . ANXIETY   . LOW BACK PAIN   . HYPERTENSION   . HYPERLIPIDEMIA      Surgical History:  Past Surgical History  Procedure Laterality Date  . Cardiac catheterization N/A 11/21/2015    Procedure: Left Heart Cath and Coronary Angiography;  Surgeon: Wellington Hampshire, MD;  Location: [facility];  Service: Cardiovascular;  Laterality: N/A;     Prescriptions  prior to admission  Medication Sig Dispense Refill Last Dose  . amLODipine (NORVASC) 2.5 MG tablet Take 1 tablet (2.5 mg total) by mouth daily. 90 tablet 3 Past Week at Unknown time  . aspirin 81 MG EC tablet Take 1 tablet (81 mg total) by mouth daily. Swallow whole. 30 tablet 12 Past Week at Unknown time  . simvastatin (ZOCOR) 20 MG tablet Take 1 tablet (20 mg total) by mouth at bedtime. 90 tablet 3 Past Week at Unknown time  . tiZANidine (ZANAFLEX) 4 MG tablet Take 1 tablet (4 mg total) by mouth every 6 (six) hours as needed for muscle spasms. 40 tablet 0 Past Week at Unknown time    Inpatient Medications:  . antiseptic oral rinse  7 mL Mouth Rinse BID  . apixaban  5 mg Oral BID  . carvedilol  25 mg Oral BID WC  . clopidogrel  75 mg Oral Q breakfast  . famotidine  20 mg Oral BID  . insulin aspart  2-6 Units Subcutaneous 6 times per day  . levofloxacin (LEVAQUIN) IV  750 mg Intravenous Q24H  . rosuvastatin  40 mg Oral q1800  . sodium chloride  3 mL Intravenous Q12H    Allergies:  Allergies  Allergen Reactions  . Lipitor [Atorvastatin]     myalgia  . Meperidine Hcl     Social History   Social History  . Marital Status: Married    Spouse Name: N/A  . Number of Children: N/A  . Years of Education: N/A  Occupational History  . [employer]   Social History Main Topics  . Smoking status: Former Smoker    Quit date: 02/08/2013  . Smokeless tobacco: Not on file  . Alcohol Use: Not on file  . Drug Use: No  . Sexual Activity: Not on file   Other Topics Concern  . Not on file   Social History Narrative   Lives with wife and teenage daughter     Family History  Problem Relation Age of Onset  . Hypertension Other      Review of Systems: All other systems reviewed and are otherwise negative except as noted above.  Physical Exam: Filed Vitals:   11/25/15 1431 11/25/15 1816 11/25/15 2047 11/26/15 0547  BP: 140/78 140/78 114/71 143/95   Pulse: 97 86 84 101  Temp: 98 F (36.7 C)  98.4 F (36.9 C) 98.1 F (36.7 C)  TempSrc: Oral  Oral Oral  Resp:   16 18  Height:      Weight:    173 lb 1.6 oz (78.518 kg)  SpO2: 93%  92% 93%    GEN- The patient is well appearing, alert and oriented x 3 today.   HEENT: normocephalic, atraumatic; sclera clear, conjunctiva pink; hearing intact; oropharynx clear; neck supple  Lungs- Clear to ausculation bilaterally, normal work of breathing.  No wheezes, rales, rhonchi Heart- Irregular rate and rhythm, no murmurs, rubs or gallops  GI- soft, non-tender, non-distended, bowel sounds present  Extremities- no clubbing, cyanosis, or edema; DP/PT/radial pulses 2+ bilaterally MS- no significant deformity or atrophy Skin- warm and dry, no rash or lesion Psych- euthymic mood, full affect Neuro- strength and sensation are intact  Labs:   Lab Results  Component Value Date   WBC 10.2 11/26/2015   HGB 12.3* 11/26/2015   HCT 37.1* 11/26/2015   MCV 95.9 11/26/2015   PLT 234 11/26/2015     Recent Labs Lab 11/25/15 0403  NA 137  K 3.3*  CL 100*  CO2 26  BUN 10  CREATININE 0.76  CALCIUM 8.8*  GLUCOSE 89    Radiology/Studies: Dg Chest Port 1 View 11/24/2015  CLINICAL DATA:  Endotracheal tube.  Shortness of breath. EXAM: PORTABLE CHEST 1 VIEW COMPARISON:  11/22/2015; 11/21/2015 ; 11/27/2005 FINDINGS: Grossly unchanged borderline enlarged cardiac silhouette and mediastinal contours given persistently reduced lung volumes. Interval removal of enteric tube. Interval extubation. Stable position of remaining support apparatus. Slight worsening of bilateral medial basilar heterogeneous opacities. Pulmonary venous congestion without frank evidence of edema. No pleural effusion or pneumothorax. Unchanged bones . IMPRESSION: 1. Interval extubation and removal of enteric tube.  No pneumothorax 2. Worsening bilateral medial basilar heterogeneous/consolidative opacities, atelectasis versus infiltrate.  Continued attention on follow-up is recommended. 3. Pulmonary venous congestion without frank evidence of edema. Electronically Signed   By: Sandi Mariscal M.D.   On: 11/24/2015 07:14   EKG: atrial fibrillation, rate 106  TELEMETRY: atrial fibrillation with controlled ventricular response  Assessment/Plan: 1.  Cardiac3 arrest The patient had a resuscitated cardiac arrest with coronary disease but no targets for revascularization.  K was noted to be 2.7 on iStat but lab draw on arrival had K of 3.5.  There is no family history of SCD Keep K >3.9, Mg >1.8 The patient meets criteria for secondary prevention ICD. Risks, benefits reviewed with patient who would like to discuss further with EP MD.  Cardiac MRI to evaluate for other causes of cardiac arrest. Beatryce Colombo plan ICD for next available time if patient  agrees. Would consider Medtronic Visia AF device in order to track AF burden over time.  No driving x6 months per [place]  2.  CAD Continue medical therapy No indication for ACE-I with normal LVEF  3.  Persistent atrial fibrillation Newly diagnosed Continue Xarelto for now CHADS2VASC is 1 Would recommend restoration of SR with DCCV (can be done at time of ICD implant - pt has been anticoagulated with Heparin/Xarelto since onset of AF)  4.  HTN Stable No change required today   Signed, Chanetta Marshall, NP 11/26/2015 1:41 PM   NOT DISCHARGE SUMMARY, PROGRESS NOTE. SEE PROGRESS NOTE IN SYSTEM ALREADY FROM TODAY.  Allegra Lai, MD

## 2015-11-26 NOTE — Progress Notes (Signed)
Spoke with cardiologist Ena Dawley MD who reads our cardiac studies and she wanted the functional parts of the MRI Cardiac sent to work station which will be read for diagnostic purposes.  Pt can return to finish exam at a later date.

## 2015-11-26 NOTE — Consult Note (Signed)
Expand All Collapse All     ELECTROPHYSIOLOGY CONSULT NOTE    Patient ID: RIOT WATERWORTH MRN: [id number], DOB/AGE: Mar 10, 1956 59 y.o.  Admit date: 11/21/2015 Date of Consult: 11/26/2015  Primary Physician: Cathlean Cower, MD Primary Cardiologist: Irish Lack  Reason for Consultation: cardiac arrest  HPI:  Daniel Richard is a 59 y.o. male with a past medical history significant for hypertension and hyperlipidemia. He collapsed while playing piano at church and received bystander CPR. EMS was called and AED delivered 3 shocks. He had ROSC within 15 minutes and was taken to [facility] for further evaluation. He does not remember episode. Cardiac catheterization demonstrated EF 40%, occluded first diagonal with collaterals not amenable to revascularization. He was not cooled. Echo demonstrated EF 55-60%, no RWMA, trivial MR.   Lab work is notable for [employer] K of 2.7 on arrival, but lab draw within 30 minutes demonstrated K of 3.5. Mag normal. Lactate elevated but now normalized. Troponin mildly elevated with flat trend.   He has also developed asymptomatic atrial fibrillation with RVR. He has been anticoagulated since start of AF.   EP has been asked to evaluate for treatment options.  He currently states that he has persistent chest soreness from CPR. He denies anginal pain, shortness of breath, LE edema, recent fevers, chills, nausea or vomiting. He was very active at home walking up to 1.5 miles per day without chest pain, shortness of breath or dizziness. He has not had prior syncope.   Past Medical History  Diagnosis Date  . ALLERGIC RHINITIS   . ANXIETY   . LOW BACK PAIN   . HYPERTENSION   . HYPERLIPIDEMIA      Surgical History:  Past Surgical History  Procedure Laterality Date  . Cardiac catheterization N/A 11/21/2015    Procedure: Left Heart Cath and Coronary Angiography; Surgeon: Wellington Hampshire, MD; Location: [facility]; Service: Cardiovascular; Laterality: N/A;     Prescriptions prior to admission  Medication Sig Dispense Refill Last Dose  . amLODipine (NORVASC) 2.5 MG tablet Take 1 tablet (2.5 mg total) by mouth daily. 90 tablet 3 Past Week at Unknown time  . aspirin 81 MG EC tablet Take 1 tablet (81 mg total) by mouth daily. Swallow whole. 30 tablet 12 Past Week at Unknown time  . simvastatin (ZOCOR) 20 MG tablet Take 1 tablet (20 mg total) by mouth at bedtime. 90 tablet 3 Past Week at Unknown time  . tiZANidine (ZANAFLEX) 4 MG tablet Take 1 tablet (4 mg total) by mouth every 6 (six) hours as needed for muscle spasms. 40 tablet 0 Past Week at Unknown time    Inpatient Medications:  . antiseptic oral rinse 7 mL Mouth Rinse BID  . apixaban 5 mg Oral BID  . carvedilol 25 mg Oral BID WC  . clopidogrel 75 mg Oral Q breakfast  . famotidine 20 mg Oral BID  . insulin aspart 2-6 Units Subcutaneous 6 times per day  . levofloxacin (LEVAQUIN) IV 750 mg Intravenous Q24H  . rosuvastatin 40 mg Oral q1800  . sodium chloride 3 mL Intravenous Q12H    Allergies:  Allergies  Allergen Reactions  . Lipitor [Atorvastatin]     myalgia  . Meperidine Hcl     Social History   Social History  . Marital Status: Married    Spouse Name: N/A  . Number of Children: N/A  . Years of Education: N/A   Occupational History  . [employer]  Social History Main Topics  . Smoking status: Former Smoker    Quit date: 02/08/2013  . Smokeless tobacco: Not on file  . Alcohol Use: Not on file  . Drug Use: No  . Sexual Activity: Not on file   Other Topics Concern  . Not on file   Social History Narrative   Lives with wife and teenage daughter     Family History  Problem Relation Age of Onset  . Hypertension Other       Review of Systems: All other systems reviewed and are otherwise negative except as noted above.  Physical Exam: Filed Vitals:   11/25/15 1431 11/25/15 1816 11/25/15 2047 11/26/15 0547  BP: 140/78 140/78 114/71 143/95  Pulse: 97 86 84 101  Temp: 98 F (36.7 C)  98.4 F (36.9 C) 98.1 F (36.7 C)  TempSrc: Oral  Oral Oral  Resp:   16 18  Height:      Weight:    173 lb 1.6 oz (78.518 kg)  SpO2: 93%  92% 93%    GEN- The patient is well appearing, alert and oriented x 3 today.  HEENT: normocephalic, atraumatic; sclera clear, conjunctiva pink; hearing intact; oropharynx clear; neck supple  Lungs- Clear to ausculation bilaterally, normal work of breathing. No wheezes, rales, rhonchi Heart- Irregular rate and rhythm, no murmurs, rubs or gallops  GI- soft, non-tender, non-distended, bowel sounds present  Extremities- no clubbing, cyanosis, or edema; DP/PT/radial pulses 2+ bilaterally MS- no significant deformity or atrophy Skin- warm and dry, no rash or lesion Psych- euthymic mood, full affect Neuro- strength and sensation are intact  Labs:   Recent Labs    Lab Results  Component Value Date   WBC 10.2 11/26/2015   HGB 12.3* 11/26/2015   HCT 37.1* 11/26/2015   MCV 95.9 11/26/2015   PLT 234 11/26/2015       Last Labs      Recent Labs Lab 11/25/15 0403  NA 137  K 3.3*  CL 100*  CO2 26  BUN 10  CREATININE 0.76  CALCIUM 8.8*  GLUCOSE 89      Radiology/Studies: Dg Chest Port 1 View 11/24/2015 CLINICAL DATA: Endotracheal tube. Shortness of breath. EXAM: PORTABLE CHEST 1 VIEW COMPARISON: 11/22/2015; 11/21/2015 ; 11/27/2005 FINDINGS: Grossly unchanged borderline enlarged cardiac silhouette and mediastinal contours given persistently reduced lung volumes. Interval removal of enteric tube. Interval extubation. Stable position of remaining support apparatus. Slight worsening of  bilateral medial basilar heterogeneous opacities. Pulmonary venous congestion without frank evidence of edema. No pleural effusion or pneumothorax. Unchanged bones . IMPRESSION: 1. Interval extubation and removal of enteric tube. No pneumothorax 2. Worsening bilateral medial basilar heterogeneous/consolidative opacities, atelectasis versus infiltrate. Continued attention on follow-up is recommended. 3. Pulmonary venous congestion without frank evidence of edema. Electronically Signed By: Sandi Mariscal M.D. On: 11/24/2015 07:14   EKG: atrial fibrillation, rate 106  TELEMETRY: atrial fibrillation with controlled ventricular response  Assessment/Plan: 1. Cardiac3 arrest The patient had a resuscitated cardiac arrest with coronary disease but no targets for revascularization. K was noted to be 2.7 on iStat but lab draw on arrival had K of 3.5.  There is no family history of SCD Keep K >3.9, Mg >1.8 The patient meets criteria for secondary prevention ICD. Risks, benefits reviewed with patient who would like to discuss further with EP MD.  Cardiac MRI to evaluate for other causes of cardiac arrest. [place] Thielman plan ICD for next available time if patient agrees. Would consider Medtronic Visia AF device in  order to track AF burden over time.  No driving x6 months per [place]  2. CAD Continue medical therapy No indication for ACE-I with normal LVEF  3. Persistent atrial fibrillation Newly diagnosed Continue Xarelto for now CHADS2VASC is 2 Would recommend restoration of SR with DCCV (can be done at time of ICD implant - pt has been anticoagulated with Heparin/Xarelto since onset of AF)  4. HTN Stable No change required today   Signed, Chanetta Marshall, NP 11/26/2015 1:41 PM   I have seen and examined this patient with Chanetta Marshall.  Agree with above, note added to reflect my findings.  On exam, regular rhytm, no murmurs, lungs clear.  Had cardiac arrest on Sunday.  Feeling well today  complaining of chest pain from his compressions.  Had cardiac cath with CAD and no intervention.  Also is in atrial fibrillation, on anticoagulation.   Has CHADS2VASC of 2 for HTN and CAD.  He has a normal EF on TTE.  Due to his normal EF and his episode of SCD, Marka Treloar plan CMRI.  He Deloria Brassfield likely need an ICD, probably early next week.  Auriah Hollings M. Audi Conover MD 11/26/2015 3:47 PM

## 2015-11-26 NOTE — Progress Notes (Signed)
Subjective: No complaints up in chair  Objective: Vital signs in last 24 hours: Temp:  [98 F (36.7 C)-98.4 F (36.9 C)] 98.1 F (36.7 C) (12/16 0547) Pulse Rate:  [84-101] 101 (12/16 0547) Resp:  [16-18] 18 (12/16 0547) BP: (114-143)/(71-95) 143/95 mmHg (12/16 0547) SpO2:  [92 %-93 %] 93 % (12/16 0547) Weight:  [173 lb 1.6 oz (78.518 kg)] 173 lb 1.6 oz (78.518 kg) (12/16 0547) Weight change: 11.2 oz (0.318 kg) Last BM Date: 11/25/15 Intake/Output from previous day: +360  (-2545 since admit) 12/15 0701 - 12/16 0700 In: 360 [P.O.:360] Out: -  Intake/Output this shift: Total I/O In: 240 [P.O.:240] Out: -   PE: General:Pleasant affect, NAD Skin:Warm and dry, brisk capillary refill HEENT:normocephalic, sclera clear, mucus membranes moist Heart:S1S2 RRR without murmur, gallup, rub or click Lungs:clear without rales, rhonchi, or wheezes QQP:YPPJ, non tender, + BS, do not palpate liver spleen or masses Ext:no lower ext edema, 2+ pedal pulses, 2+ radial pulses Neuro:alert and oriented X 3, MAE, follows commands, + facial symmetry Tele:  A fib with HR up to 120    Lab Results:  Recent Labs  11/25/15 0403 11/26/15 0415  WBC 11.6* 10.2  HGB 11.7* 12.3*  HCT 35.9* 37.1*  PLT 200 234   BMET  Recent Labs  11/24/15 0444 11/25/15 0403  NA 138 137  K 3.7 3.3*  CL 101 100*  CO2 29 26  GLUCOSE 112* 89  BUN 7 10  CREATININE 0.78 0.76  CALCIUM 8.6* 8.8*   No results for input(s): TROPONINI in the last 72 hours.  Invalid input(s): CK, MB  Lab Results  Component Value Date   CHOL 203* 06/18/2015   HDL 55.60 06/18/2015   LDLCALC 129* 06/18/2015   LDLDIRECT 195.1 10/24/2013   TRIG 141 11/24/2015   CHOLHDL 4 06/18/2015   No results found for: HGBA1C   Lab Results  Component Value Date   TSH 2.07 06/18/2015       Studies/Results: No results found.  Medications: I have reviewed the patient's current medications. Scheduled Meds: . antiseptic oral  rinse  7 mL Mouth Rinse BID  . apixaban  5 mg Oral BID  . carvedilol  25 mg Oral BID WC  . clopidogrel  75 mg Oral Q breakfast  . famotidine  20 mg Oral BID  . insulin aspart  2-6 Units Subcutaneous 6 times per day  . levofloxacin (LEVAQUIN) IV  750 mg Intravenous Q24H  . rosuvastatin  40 mg Oral q1800  . sodium chloride  3 mL Intravenous Q12H   Continuous Infusions:  PRN Meds:.sodium chloride, sodium chloride, acetaminophen (TYLENOL) oral liquid 160 mg/5 mL, acetaminophen, ketorolac, midazolam, sodium chloride  Assessment/Plan: Day of hospitalization: 4   Pt p/w witnessed out of hospital cardiac arrest. CPR was performed followed by defibrillation by EMS. EKG showed possible subtle anterolateral ST elevation. LHC with significant branches disease with occlusion of first diagonal likely culprit for vfib. However, vessel is 1.28m in diameter and is diffusely diseased but has collaterals. No obstruction on main vessels. Recommend medical mgmt.   Pt extubated 11/23/15 and doing well. Having some chest soreness from the compressions. Up walking around.   Cardiac arrest/CAD Left dominant system with diagonal disease on cath. Collaterals noted. Agree with medical management. Global LV dysfunction, likely due to arrest, shocks, CPR. Medical therapy for now. He was not cooled. only Troponin 0.11 - 2D ECHO with normal LVEF and no WMAs - continue coreg to 12.'5mg'$  bid.  Will check baseline LFTs/baseline and add statin . Does not tolerate Lipitor. Will add rosuvastatin '40mg'$  - continue plavix/ASA -will need to add ACEi eventually, but don't want to add now and we need to use AV nodal blocking agents to control afib  -PT  - ? Cause of arrest? Primary arrhthymia.EP consult  Atrial fibrillation  Monitor showing afib with HRs in 100-120s. Likely provoked by acute illness. Asymptomatic.  -continue coreg to 12.'5mg'$  bid -heparin per pharmacy. CHADSVASC 2 (HTN, CAD). Continue to monitor  for now. May need to go home on a NOAC now on Eliquis with plans for DCCV in 3-4 weeks if still in afib    Fever Resolved.   HTN mostly controlled  Hyperlipidemia on crestor 40  LOS: 5 days   Time spent with pt. : 15 minutes. [facility] R  Nurse Practitioner Certified Pager [phone] or after 5pm and on weekends call [phone] 11/26/2015, 11:16 AM   I have examined the patient and reviewed assessment and plan and discussed with patient.  Agree with above as stated.  HR control still ok in the setting of AFib.  Had a high fever while intubbated and was treated with broad spectrum antibiotics.  THis is now more stable.  Still receiving Levaquin.  WIll check with EP if he would now be a candidate for device since infectious issues seem more controlled.  Beta blocker for AFib.  He is walking with PT without sx.  COnsider cardioversion after 3-4 weeks of anticoagualtion.  Marland Kitchen    Thaily Hackworth S.

## 2015-11-27 DIAGNOSIS — R000 Tachycardia, unspecified: Secondary | ICD-10-CM

## 2015-11-27 LAB — LIPID PANEL
Cholesterol: 181 mg/dL (ref 0–200)
HDL: 26 mg/dL — ABNORMAL LOW (ref >40)
LDL Cholesterol: 136 mg/dL — ABNORMAL HIGH (ref 0–99)
Total CHOL/HDL Ratio: 7 RATIO
Triglycerides: 96 mg/dL (ref <150)
VLDL: 19 mg/dL (ref 0–40)

## 2015-11-27 LAB — GLUCOSE, CAPILLARY
Glucose-Capillary: 94 mg/dL (ref 65–99)
Glucose-Capillary: 99 mg/dL (ref 65–99)

## 2015-11-27 LAB — CULTURE, BLOOD (ROUTINE X 2)
Culture: NO GROWTH
Culture: NO GROWTH

## 2015-11-27 LAB — CBC
HCT: 36.1 % — ABNORMAL LOW (ref 39.0–52.0)
Hemoglobin: 12.1 g/dL — ABNORMAL LOW (ref 13.0–17.0)
MCH: 32 pg (ref 26.0–34.0)
MCHC: 33.5 g/dL (ref 30.0–36.0)
MCV: 95.5 fL (ref 78.0–100.0)
Platelets: 253 10*3/uL (ref 150–400)
RBC: 3.78 MIL/uL — ABNORMAL LOW (ref 4.22–5.81)
RDW: 12.8 % (ref 11.5–15.5)
WBC: 9.2 10*3/uL (ref 4.0–10.5)

## 2015-11-27 MED ORDER — LEVOFLOXACIN 750 MG PO TABS
750.0000 mg | ORAL_TABLET | Freq: Every day | ORAL | Status: DC
  Administered 2015-11-27: 750 mg via ORAL
  Filled 2015-11-27: qty 1

## 2015-11-27 MED ORDER — TRAMADOL HCL 50 MG PO TABS
50.0000 mg | ORAL_TABLET | Freq: Four times a day (QID) | ORAL | Status: DC
  Administered 2015-11-27 – 2015-11-30 (×8): 50 mg via ORAL
  Filled 2015-11-27 (×11): qty 1

## 2015-11-27 MED ORDER — ALPRAZOLAM 0.25 MG PO TABS
0.2500 mg | ORAL_TABLET | Freq: Two times a day (BID) | ORAL | Status: DC
  Administered 2015-11-27 – 2015-11-30 (×6): 0.25 mg via ORAL
  Filled 2015-11-27 (×6): qty 1

## 2015-11-27 MED ORDER — DILTIAZEM HCL ER COATED BEADS 120 MG PO CP24
120.0000 mg | ORAL_CAPSULE | Freq: Every day | ORAL | Status: DC
  Administered 2015-11-27 – 2015-11-28 (×2): 120 mg via ORAL
  Filled 2015-11-27 (×2): qty 1

## 2015-11-27 NOTE — Progress Notes (Addendum)
Subjective: No complaints up in chair  Objective: Vital signs in last 24 hours: Temp:  [97.8 F (36.6 C)-98.9 F (37.2 C)] 97.8 F (36.6 C) (12/17 0610) Pulse Rate:  [95-99] 98 (12/17 0610) Resp:  [17-18] 18 (12/17 0610) BP: (117-125)/(71-84) 118/78 mmHg (12/17 0610) SpO2:  [93 %-94 %] 94 % (12/17 0610) Weight:  [165 lb 12.6 oz (75.2 kg)] 165 lb 12.6 oz (75.2 kg) (12/17 0610) Weight change: -7 lb 5 oz (-3.318 kg) Last BM Date: 11/26/15 Intake/Output from previous day: +360  (-2545 since admit) 12/16 0701 - 12/17 0700 In: 480 [P.O.:480] Out: -  Intake/Output this shift: Total I/O In: 240 [P.O.:240] Out: -   PE: General:Pleasant affect, NAD Skin:Warm and dry, brisk capillary refill HEENT:normocephalic, sclera clear, mucus membranes moist Heart:S1S2 RRR without murmur, gallup, rub or click Lungs:clear without rales, rhonchi, or wheezes MAU:QJFH, non tender, + BS, do not palpate liver spleen or masses Ext:no lower ext edema, 2+ pedal pulses, 2+ radial pulses Neuro:alert and oriented X 3, MAE, follows commands, + facial symmetry Tele:  A fib with HR up to 120    Lab Results:  Recent Labs  11/26/15 0415 11/27/15 0315  WBC 10.2 9.2  HGB 12.3* 12.1*  HCT 37.1* 36.1*  PLT 234 253   BMET  Recent Labs  11/25/15 0403  NA 137  K 3.3*  CL 100*  CO2 26  GLUCOSE 89  BUN 10  CREATININE 0.76  CALCIUM 8.8*   No results for input(s): TROPONINI in the last 72 hours.  Invalid input(s): CK, MB  Lab Results  Component Value Date   CHOL 181 11/27/2015   HDL 26* 11/27/2015   LDLCALC 136* 11/27/2015   LDLDIRECT 195.1 10/24/2013   TRIG 96 11/27/2015   CHOLHDL 7.0 11/27/2015   No results found for: HGBA1C   Lab Results  Component Value Date   TSH 2.07 06/18/2015       Studies/Results: No results found.  Medications: I have reviewed the patient's current medications. Scheduled Meds: . ALPRAZolam  0.25 mg Oral BID  . antiseptic oral rinse  7 mL Mouth  Rinse BID  . apixaban  5 mg Oral BID  . carvedilol  25 mg Oral BID WC  . clopidogrel  75 mg Oral Q breakfast  . diltiazem  120 mg Oral Daily  . famotidine  20 mg Oral BID  . levofloxacin (LEVAQUIN) IV  750 mg Intravenous Q24H  . rosuvastatin  40 mg Oral q1800  . sodium chloride  3 mL Intravenous Q12H  . traMADol  50 mg Oral 4 times per day   Continuous Infusions:  PRN Meds:.sodium chloride, sodium chloride, acetaminophen (TYLENOL) oral liquid 160 mg/5 mL, acetaminophen, ketorolac, midazolam, sodium chloride  TTE: 11/25/2015 Left ventricle: The cavity size was normal. There was moderate concentric hypertrophy. Systolic function was normal. The estimated ejection fraction was in the range of 55% to 60%. Wall motion was normal; there were no regional wall motion abnormalities. - Aortic valve: Severe diffuse thickening and calcification involving the left coronary and noncoronary cusp. - Mitral valve: Calcified annulus. There was trivial regurgitation.    Assessment/Plan: Day of hospitalization: 6   Pt p/w witnessed out of hospital cardiac arrest. CPR was performed followed by defibrillation by EMS. EKG showed possible subtle anterolateral ST elevation. LHC with significant branches disease with occlusion of first diagonal likely culprit for vfib. However, vessel is 1.22m in diameter and is diffusely diseased but has collaterals. No obstruction on main  vessels. Recommend medical mgmt.   Pt extubated 11/23/15 and doing well. Having some chest soreness from the compressions. Up walking around.   Cardiac arrest/CAD Left dominant system with diagonal disease on cath. Collaterals noted. Agree with medical management. Global LV dysfunction, likely due to arrest, shocks, CPR. Medical therapy for now. He was not cooled. only Troponin 0.11 - 2D ECHO with normal LVEF and no WMAs - continue coreg to 12.'5mg'$  bid. Will check baseline LFTs/baseline and add statin . Does not  tolerate Lipitor. Will add rosuvastatin '40mg'$  - continue plavix/ASA -will need to add ACEi eventually, but don't want to add now and we need to use AV nodal blocking agents to control afib  -PT  - ? Cause of arrest? Primary arrhthymia. EP consult: Had cardiac arrest on Sunday. Feeling well today complaining of chest pain from his compressions. Had cardiac cath with CAD and no intervention. Also is in atrial fibrillation, on anticoagulation. Has CHADS2VASC of 2 for HTN and CAD. He has a normal EF on TTE. Due to his normal EF and his episode of SCD, will plan CMRI. He will likely need an ICD, probably early next week.  The patient was unable to tolerate MRI as he is claustrophobic.   Atrial fibrillation  Monitor showing afib with HRs in 100-130s, likely provoked by acute illness. Asymptomatic.  -increase coreg to 25 mg bid -heparin per pharmacy. CHADSVASC 2 (HTN, CAD). Continue to monitor for now. May need to go home on a NOAC now on Eliquis with plans for DCCV in 3-4 weeks if still in afib    Fever Resolved.   HTN mostly controlled  Hyperlipidemia on crestor [place], Alawna Graybeal H

## 2015-11-27 NOTE — Progress Notes (Signed)
[facility] PROGRESS NOTE  Daniel Richard WCB:[id number] DOB: 05/24/1956 DOA: 11/21/2015 PCP: Cathlean Cower, MD  Brief narrative 59 year old male with history of hypertension, hyperlipidemia, anxiety, low back pain who collapsed while playing piano at church. Performed CPR by bystanders. Received 3 shocks by AED and does of epinephrine. Achieved ROS see within about 15 minutes. Patient was intubated and on epinephrine drip upon arrival to the ED. EKG showed possible anteriolateral ST elevation. Patient taken to cardiac catheterization which showed significant branches disease with occlusion of first diagonal likely contributed into V. fib. Vessel had collaterals with no obstruction on main vessels. Also found to be in A. fib and started on IV heparin. Patient extubated on 12/13 and transferred to hospitalist service on 12/15.  Assessment/Plan: Cardiac arrest Possibly secondary to V. fib with finding of diagonal vessel disease on cardiac cath with global LV dysfunction.. has collaterals and cardiology recommends medical management. The echo showed normal EF with no wall motion abnormality. Currently hemodynamically stable. Increased Coreg dose.. placed on Crestor. Lipid panel shows LDL of 136. Continue Plavix. -Ongoing chest soreness. Have added tramadol in addition to when necessary Toradol. -Appreciate cardiology follow-up. Seen by EP who recommended cardiac MRI to evaluate other causes of cardiac arrest. Also plan on ICD placement next week after cardiac MRI evaluated. Patient was extremely claustrophobic and did not tolerate MRI. He is refusing to undergo repeat MRI on Monday. Discussed with EP who recommend that obtaining an MRI would be necessary before deciding on ICD placement.i will talk to the patient again tomorrow.   Atrial fibrillation Heart rate still in 100-120s despite increasing Coreg dose yesterday. Possibly also contributed by pain and anxiety. Have added daily Cardizem. on  IV heparin.CHADSVASC 2 (HTN, CAD). anticoagulations options discussed by cardiology and patient prefers NOAC. Switched to [employer]. Plan on DC cardioversion in 3-4 weeks if Afib persists.  Chest pain and soreness On when necessary Toradol with minimal relief. Have added tramadol. Patient very hesitant to use narcotics being scared of falling back to sleep. He is also very anxious contributing to tachycardia. I have added scheduled tramadol and low-dose Xanax.  AKI/lactic acidosis Possibly secondary to Cardiogenic  shock. Improved with IV hydration.  hypokalemia Replenished. Normal mg  ? Aspiration pneumonia On empiric vancomycin and Zosyn since 12/12. Cultures negative.  transitioned to Levaquin. (complete 7 day course of antibiotic today)    Toxic metabolic encephalopathy Resolved  DVT prophylaxis Eliquis  Diet: Heart healthy   Code Status: full code Family Communication: none at bedside Disposition Plan:home in a.m. if heart rate stable   Consultants:  PC CM  Cardiology  Procedures:  Intubation   Left heart cath  2d echo    Antibiotics:  IV vanco and zosyn 12/12--12/15  levaquin 12/15--  HPI/Subjective:  Seen and examined. She has some chest soreness. Heart rate still elevated in 110-120s. Patient sent for cardiac MRI yesterday but could not tolerate has was extremely claustrophobic.  Objective: Filed Vitals:   11/27/15 0610 11/27/15 1352  BP: 118/78 101/62  Pulse: 98 78  Temp: 97.8 F (36.6 C) 97.7 F (36.5 C)  Resp: 18 15    Intake/Output Summary (Last 24 hours) at 11/27/15 1442 Last data filed at 11/27/15 1300  Gross per 24 hour  Intake    480 ml  Output      0 ml  Net    480 ml   Filed Weights   11/25/15 0546 11/26/15 0547 11/27/15 0610  Weight: 78.155 kg (172 lb 4.8  oz) 78.518 kg (173 lb 1.6 oz) 75.2 kg (165 lb 12.6 oz)    Exam:   General:   not in distress  HEENT:moist oral mucosa  Chest: Clear to auscultation bilaterally,  minimal tenderness to pressure over anterior chest  Cardiovascular:  S1 and S2 irregular, no murmurs rub or gallop  GI: Soft, nondistended, nontender, bowel sounds present  Musculoskeletal:  Warm, no edema    Data Reviewed: Basic Metabolic Panel:  Recent Labs Lab 11/22/15 0005 11/22/15 0344 11/22/15 0901 11/23/15 0455 11/24/15 0444 11/25/15 0403  NA 141 139 139 137 138 137  K 3.2* 5.0 5.1 3.7 3.7 3.3*  CL 111 110 112* 107 101 100*  CO2 19* 21* '22 25 29 26  '$ GLUCOSE 148* 138* 95 103* 112* 89  BUN '14 14 14 7 7 10  '$ CREATININE 1.05 0.95 0.85 0.76 0.78 0.76  CALCIUM 7.3* 7.9* 7.9* 8.3* 8.6* 8.8*  MG 2.3 1.9  --  1.8 2.1 1.9  PHOS 2.6 2.9  --  2.1* 3.5 3.8   Liver Function Tests: No results for input(s): AST, ALT, ALKPHOS, BILITOT, PROT, ALBUMIN in the last 168 hours. No results for input(s): LIPASE, AMYLASE in the last 168 hours. No results for input(s): AMMONIA in the last 168 hours. CBC:  Recent Labs Lab 11/23/15 0455 11/24/15 0444 11/25/15 0403 11/26/15 0415 11/27/15 0315  WBC 12.6* 13.7* 11.6* 10.2 9.2  HGB 11.3* 12.4* 11.7* 12.3* 12.1*  HCT 35.3* 37.5* 35.9* 37.1* 36.1*  MCV 96.2 95.7 95.7 95.9 95.5  PLT 160 175 200 234 253   Cardiac Enzymes: No results for input(s): CKTOTAL, CKMB, CKMBINDEX, TROPONINI in the last 168 hours. BNP (last 3 results) No results for input(s): BNP in the last 8760 hours.  ProBNP (last 3 results) No results for input(s): PROBNP in the last 8760 hours.  CBG:  Recent Labs Lab 11/26/15 1112 11/26/15 1741 11/26/15 2027 11/27/15 0019 11/27/15 0612  GLUCAP 110* 91 115* 94 99    Recent Results (from the past 240 hour(s))  MRSA PCR Screening     Status: None   Collection Time: 11/22/15  2:14 AM  Result Value Ref Range Status   MRSA by PCR NEGATIVE NEGATIVE Final    Comment:        The GeneXpert MRSA Assay (FDA approved for NASAL specimens only), is one component of a comprehensive MRSA colonization surveillance program.  It is not intended to diagnose MRSA infection nor to guide or monitor treatment for MRSA infections.   Culture, blood (routine x 2)     Status: None   Collection Time: 11/22/15  6:30 PM  Result Value Ref Range Status   Specimen Description BLOOD RIGHT ANTECUBITAL  Final   Special Requests BOTTLES DRAWN AEROBIC AND ANAEROBIC 10CC  Final   Culture NO GROWTH 5 DAYS  Final   Report Status 11/27/2015 FINAL  Final  Culture, blood (routine x 2)     Status: None   Collection Time: 11/22/15  6:45 PM  Result Value Ref Range Status   Specimen Description BLOOD RIGHT HAND  Final   Special Requests BOTTLES DRAWN AEROBIC AND ANAEROBIC 10CC  Final   Culture NO GROWTH 5 DAYS  Final   Report Status 11/27/2015 FINAL  Final  Culture, Urine     Status: None   Collection Time: 11/22/15  6:50 PM  Result Value Ref Range Status   Specimen Description URINE, CATHETERIZED  Final   Special Requests Normal  Final   Culture NO GROWTH  1 DAY  Final   Report Status 11/23/2015 FINAL  Final     Studies: No results found.  Scheduled Meds: . ALPRAZolam  0.25 mg Oral BID  . antiseptic oral rinse  7 mL Mouth Rinse BID  . apixaban  5 mg Oral BID  . carvedilol  25 mg Oral BID WC  . clopidogrel  75 mg Oral Q breakfast  . diltiazem  120 mg Oral Daily  . famotidine  20 mg Oral BID  . levofloxacin  750 mg Oral Daily  . rosuvastatin  40 mg Oral q1800  . sodium chloride  3 mL Intravenous Q12H  . traMADol  50 mg Oral 4 times per day   Continuous Infusions:      Time spent: 25 minutes    Avianah Pellman, [place]  [facility] Pager [phone]. If 7PM-7AM, please contact night-coverage at [email or web address], password [facility] 11/27/2015, 2:42 PM  LOS: 6 days

## 2015-11-28 LAB — CBC
HCT: 36.5 % — ABNORMAL LOW (ref 39.0–52.0)
Hemoglobin: 12 g/dL — ABNORMAL LOW (ref 13.0–17.0)
MCH: 31.5 pg (ref 26.0–34.0)
MCHC: 32.9 g/dL (ref 30.0–36.0)
MCV: 95.8 fL (ref 78.0–100.0)
Platelets: 274 10*3/uL (ref 150–400)
RBC: 3.81 MIL/uL — ABNORMAL LOW (ref 4.22–5.81)
RDW: 12.8 % (ref 11.5–15.5)
WBC: 10.1 10*3/uL (ref 4.0–10.5)

## 2015-11-28 MED ORDER — DIAZEPAM 5 MG/ML IJ SOLN
5.0000 mg | Freq: Once | INTRAMUSCULAR | Status: DC
Start: 2015-11-28 — End: 2015-11-30

## 2015-11-28 MED ORDER — CEFAZOLIN SODIUM-DEXTROSE 2-3 GM-% IV SOLR
2.0000 g | INTRAVENOUS | Status: AC
Start: 2015-11-29 — End: 2015-11-29
  Administered 2015-11-29: 2 g via INTRAVENOUS
  Filled 2015-11-28: qty 50

## 2015-11-28 MED ORDER — SODIUM CHLORIDE 0.9 % IR SOLN
80.0000 mg | Status: AC
  Administered 2015-11-29: 80 mg

## 2015-11-28 MED ORDER — SODIUM CHLORIDE 0.9 % IV SOLN
INTRAVENOUS | Status: DC
  Administered 2015-11-29: 06:00:00 via INTRAVENOUS

## 2015-11-28 NOTE — Progress Notes (Deleted)
Subjective: No complaints up in chair.  Was not able to tolerate CMRI yesterday due to anxiety and claustrophobia.  Objective: Vital signs in last 24 hours: Temp:  [97.7 F (36.5 C)-98.1 F (36.7 C)] 98 F (36.7 C) (12/18 0500) Pulse Rate:  [78-81] 81 (12/18 0500) Resp:  [15-18] 17 (12/18 0500) BP: (100-136)/(60-89) 100/60 mmHg (12/18 0500) SpO2:  [94 %-97 %] 95 % (12/18 0500) Weight:  [173 lb 9.6 oz (78.744 kg)] 173 lb 9.6 oz (78.744 kg) (12/18 0500) Weight change: 7 lb 13 oz (3.545 kg) Last BM Date: 11/27/15 Intake/Output from previous day: +360  (-2545 since admit) 12/17 0701 - 12/18 0700 In: 723 [P.O.:720; I.V.:3] Out: 2 [Urine:2] Intake/Output this shift: Total I/O In: 840 [P.O.:840] Out: -   PE: General:Pleasant affect, NAD Skin:Warm and dry, brisk capillary refill HEENT:normocephalic, sclera clear, mucus membranes moist Heart:S1S2 RRR without murmur, gallup, rub or click Lungs:clear without rales, rhonchi, or wheezes DTO:IZTI, non tender, + BS, do not palpate liver spleen or masses Ext:no lower ext edema, 2+ pedal pulses, 2+ radial pulses Neuro:alert and oriented X 3, MAE, follows commands, + facial symmetry Tele:  A fib   Lab Results:  Recent Labs  11/27/15 0315 11/28/15 0348  WBC 9.2 10.1  HGB 12.1* 12.0*  HCT 36.1* 36.5*  PLT 253 274   BMET No results for input(s): NA, K, CL, CO2, GLUCOSE, BUN, CREATININE, CALCIUM in the last 72 hours.  Invalid input(s): MAGNESIUM No results for input(s): TROPONINI in the last 72 hours.  Invalid input(s): CK, MB  Lab Results  Component Value Date   CHOL 181 11/27/2015   HDL 26* 11/27/2015   LDLCALC 136* 11/27/2015   LDLDIRECT 195.1 10/24/2013   TRIG 96 11/27/2015   CHOLHDL 7.0 11/27/2015   No results found for: HGBA1C   Lab Results  Component Value Date   TSH 2.07 06/18/2015       Studies/Results: Mr Card Morphology W/o Cm  11/27/2015  CLINICAL DATA:  59 year old male post cardiac arrest.  EXAM: CARDIAC MRI TECHNIQUE: The patient was scanned on a 1.5 Tesla GE magnet. A dedicated cardiac coil was used. Functional imaging was done using Fiesta sequences. 2,3, and 4 chamber views were done to assess for RWMA's. Modified Simpson's rule using a short axis stack was used to calculate an ejection fraction on a dedicated work Conservation officer, nature. The patient received no contrast. After 10 minutes inversion recovery sequences were used to assess for infiltration and scar tissue. CONTRAST:  None FINDINGS: This was an incomplete study without use of Gadolinium contrast as the patient was claustrophobic. 1. Normal left ventricular size, thickness and systolic function (LVEF = 55%). There are no regional wall motion abnormalities. LVEDD:  50 mm LVESD:  36 mm LVEDV:  132 ml LVESV:  63 ml SV:  73 ml CO:  8 L/minute Myocardial mass:  135 g 2. Normal right ventricular size, thickness and systolic function (LVEF = 59%) with no regional wall motion abnormalities. 3.  Mild left atrial dilatation. 4.  Trivial mitral and mild tricuspid regurgitation. 5. Normal size of the aortic root, thoracic aorta and pulmonary artery. IMPRESSION: 1. Normal left ventricular size, thickness and systolic function (LVEF = 55%). There are no regional wall motion abnormalities. 2. Normal right ventricular size, thickness and systolic function (LVEF = 59%) with no regional wall motion abnormalities. We Wana Mount attempt to perform contrast images on 11/29/2015. Ena Dawley Electronically Signed   By: Ena Dawley  On: 11/27/2015 15:10    Medications: I have reviewed the patient's current medications. Scheduled Meds: . ALPRAZolam  0.25 mg Oral BID  . antiseptic oral rinse  7 mL Mouth Rinse BID  . apixaban  5 mg Oral BID  . carvedilol  25 mg Oral BID WC  . clopidogrel  75 mg Oral Q breakfast  . diltiazem  120 mg Oral Daily  . famotidine  20 mg Oral BID  . levofloxacin  750 mg Oral Daily  . rosuvastatin  40 mg Oral  q1800  . sodium chloride  3 mL Intravenous Q12H  . traMADol  50 mg Oral 4 times per day   Continuous Infusions:  PRN Meds:.sodium chloride, sodium chloride, acetaminophen (TYLENOL) oral liquid 160 mg/5 mL, acetaminophen, ketorolac, midazolam, sodium chloride  TTE: 11/25/2015 Left ventricle: The cavity size was normal. There was moderate concentric hypertrophy. Systolic function was normal. The estimated ejection fraction was in the range of 55% to 60%. Wall motion was normal; there were no regional wall motion abnormalities. - Aortic valve: Severe diffuse thickening and calcification involving the left coronary and noncoronary cusp. - Mitral valve: Calcified annulus. There was trivial regurgitation.    Assessment/Plan: Day of hospitalization: 6   Pt p/w witnessed out of hospital cardiac arrest. CPR was performed followed by defibrillation by EMS. EKG showed possible subtle anterolateral ST elevation. LHC with significant branches disease with occlusion of first diagonal likely culprit for vfib. However, vessel is 1.26m in diameter and is diffusely diseased but has collaterals. No obstruction on main vessels. Recommend medical mgmt.   Pt extubated 11/23/15 and doing well. Having some chest soreness from the compressions. Up walking around.   Cardiac arrest/CAD Left dominant system with diagonal disease on cath. Collaterals noted. Agree with medical management. Global LV dysfunction initially but improved on CMRI, likely due to arrest, shocks, CPR. Medical therapy for now. He was not cooled. only Troponin 0.11 - 2D ECHO with normal LVEF and no WMAs - continue coreg to 12.'5mg'$  bid. Larell Baney check baseline LFTs/baseline and add statin . Does not tolerate Lipitor. Asahel Risden add rosuvastatin '40mg'$  - continue plavix/ASA -Alroy Portela need to add ACEi eventually, but don't want to add now and we need to use AV nodal blocking agents to control afib  -PT  - Discussed CMRI with the patient  and the possibility of using anxiolytics for the exam.  He is currently refusing this and would like to proceed with ICD.  Johnaton Sonneborn discuss CMRI tomorrow again to see if he Julie-Ann Vanmaanen go forward with the test.  If not, may go forward with ICD placement.  He is very anxious over the event and would not feel comfortable with discharge without ICD placement.   Atrial fibrillation  Monitor showing afib with HRs in 100-130s, likely provoked by acute illness. Asymptomatic.  -AF began the night of Dec 12-13, has been anticoagulated since that time -on coreg to 25 mg bid with improved rate control -currently on apixaban for CHADS2VASc of 2.  Baili Stang likely need to be cardioverted back to sinus rhythm.  Fever Resolved.   HTN controlled  Hyperlipidemia on crestor 40    Denece Shearer MMeredith Leeds12/18/2016 8:37 AM

## 2015-11-28 NOTE — Progress Notes (Signed)
Subjective: No complaints up in chair.  Was not able to tolerate CMRI yesterday due to anxiety and claustrophobia.  Objective: Vital signs in last 24 hours: Temp:  [97.7 F (36.5 C)-98.1 F (36.7 C)] 97.7 F (36.5 C) (12/18 1425) Pulse Rate:  [68-81] 68 (12/18 1425) Resp:  [17-18] 18 (12/18 1425) BP: (100-136)/(60-89) 101/65 mmHg (12/18 1425) SpO2:  [94 %-97 %] 97 % (12/18 1425) Weight:  [173 lb 9.6 oz (78.744 kg)] 173 lb 9.6 oz (78.744 kg) (12/18 0500) Weight change: 7 lb 13 oz (3.545 kg) Last BM Date: 11/27/15 Intake/Output from previous day: +360  (-2545 since admit) 12/17 0701 - 12/18 0700 In: 723 [P.O.:720; I.V.:3] Out: 2 [Urine:2] Intake/Output this shift: Total I/O In: 1080 [P.O.:1080] Out: -   PE: General:Pleasant affect, NAD Skin:Warm and dry, brisk capillary refill HEENT:normocephalic, sclera clear, mucus membranes moist Heart:S1S2 RRR without murmur, gallup, rub or click Lungs:clear without rales, rhonchi, or wheezes QJJ:HERD, non tender, + BS, do not palpate liver spleen or masses Ext:no lower ext edema, 2+ pedal pulses, 2+ radial pulses Neuro:alert and oriented X 3, MAE, follows commands, + facial symmetry Tele:  A fib   Lab Results:  Recent Labs  11/27/15 0315 11/28/15 0348  WBC 9.2 10.1  HGB 12.1* 12.0*  HCT 36.1* 36.5*  PLT 253 274   BMET No results for input(s): NA, K, CL, CO2, GLUCOSE, BUN, CREATININE, CALCIUM in the last 72 hours.  Invalid input(s): MAGNESIUM No results for input(s): TROPONINI in the last 72 hours.  Invalid input(s): CK, MB  Lab Results  Component Value Date   CHOL 181 11/27/2015   HDL 26* 11/27/2015   LDLCALC 136* 11/27/2015   LDLDIRECT 195.1 10/24/2013   TRIG 96 11/27/2015   CHOLHDL 7.0 11/27/2015   No results found for: HGBA1C   Lab Results  Component Value Date   TSH 2.07 06/18/2015       Studies/Results: Mr Card Morphology W/o Cm  11/27/2015  CLINICAL DATA:  59 year old male post cardiac arrest.  EXAM: CARDIAC MRI TECHNIQUE: The patient was scanned on a 1.5 Tesla GE magnet. A dedicated cardiac coil was used. Functional imaging was done using Fiesta sequences. 2,3, and 4 chamber views were done to assess for RWMA's. Modified Simpson's rule using a short axis stack was used to calculate an ejection fraction on a dedicated work Conservation officer, nature. The patient received no contrast. After 10 minutes inversion recovery sequences were used to assess for infiltration and scar tissue. CONTRAST:  None FINDINGS: This was an incomplete study without use of Gadolinium contrast as the patient was claustrophobic. 1. Normal left ventricular size, thickness and systolic function (LVEF = 55%). There are no regional wall motion abnormalities. LVEDD:  50 mm LVESD:  36 mm LVEDV:  132 ml LVESV:  63 ml SV:  73 ml CO:  8 L/minute Myocardial mass:  135 g 2. Normal right ventricular size, thickness and systolic function (LVEF = 59%) with no regional wall motion abnormalities. 3.  Mild left atrial dilatation. 4.  Trivial mitral and mild tricuspid regurgitation. 5. Normal size of the aortic root, thoracic aorta and pulmonary artery. IMPRESSION: 1. Normal left ventricular size, thickness and systolic function (LVEF = 55%). There are no regional wall motion abnormalities. 2. Normal right ventricular size, thickness and systolic function (LVEF = 59%) with no regional wall motion abnormalities. We Garyson Stelly attempt to perform contrast images on 11/29/2015. Ena Dawley Electronically Signed   By: Ena Dawley  On: 11/27/2015 15:10    Medications: I have reviewed the patient's current medications. Scheduled Meds: . ALPRAZolam  0.25 mg Oral BID  . antiseptic oral rinse  7 mL Mouth Rinse BID  . apixaban  5 mg Oral BID  . carvedilol  25 mg Oral BID WC  . clopidogrel  75 mg Oral Q breakfast  . diazepam  5 mg Intravenous Once  . diltiazem  120 mg Oral Daily  . famotidine  20 mg Oral BID  . rosuvastatin  40 mg Oral  q1800  . sodium chloride  3 mL Intravenous Q12H  . traMADol  50 mg Oral 4 times per day   Continuous Infusions:  PRN Meds:.sodium chloride, sodium chloride, acetaminophen (TYLENOL) oral liquid 160 mg/5 mL, acetaminophen, ketorolac, midazolam, sodium chloride  TTE: 11/25/2015 Left ventricle: The cavity size was normal. There was moderate concentric hypertrophy. Systolic function was normal. The estimated ejection fraction was in the range of 55% to 60%. Wall motion was normal; there were no regional wall motion abnormalities. - Aortic valve: Severe diffuse thickening and calcification involving the left coronary and noncoronary cusp. - Mitral valve: Calcified annulus. There was trivial regurgitation.    Assessment/Plan: Day of hospitalization: 6   Pt p/w witnessed out of hospital cardiac arrest. CPR was performed followed by defibrillation by EMS. EKG showed possible subtle anterolateral ST elevation. LHC with significant branches disease with occlusion of first diagonal likely culprit for vfib. However, vessel is 1.43m in diameter and is diffusely diseased but has collaterals. No obstruction on main vessels. Recommend medical mgmt.   Pt extubated 11/23/15 and doing well. Having some chest soreness from the compressions. Up walking around.   Cardiac arrest/CAD Left dominant system with diagonal disease on cath. Collaterals noted. Agree with medical management. Global LV dysfunction initially but improved on CMRI, likely due to arrest, shocks, CPR. Medical therapy for now. He was not cooled. only Troponin 0.11 - 2D ECHO with normal LVEF and no WMAs - continue coreg to 12.'5mg'$  bid. Clydine Parkison check baseline LFTs/baseline and add statin . Does not tolerate Lipitor. Kacey Vicuna add rosuvastatin '40mg'$  - Lizzet Hendley hold ASA and plavix prior to ICD placement, may need to be restarted post procedure -Saryiah Bencosme need to add ACEi eventually, but don't want to add now and we need to use AV nodal  blocking agents to control afib  -Kellin Bartling need genetic testing for causes of SCD - Discussed CMRI with the patient and the possibility of using anxiolytics for the exam.  He is currently refusing this and would like to proceed with ICD.  Rosenda Geffrard discuss CMRI tomorrow again to see if he Renso Swett go forward with the test.  If not, may go forward with ICD placement.  He is very anxious over the event and would not feel comfortable with discharge without ICD placement.   Atrial fibrillation  Monitor showing afib with HRs in 100-130s, likely provoked by acute illness. Asymptomatic.  -AF began the night of Dec 12-13, has been anticoagulated since that time -on coreg to 25 mg bid with improved rate control -currently on apixaban for CHADS2VASc of 2.  Shemuel Harkleroad likely need to be cardioverted back to sinus rhythm.  Fever Resolved.   HTN controlled  Hyperlipidemia on crestor 40    Estus Krakowski MMeredith Leeds12/18/2016 3:23 PM

## 2015-11-28 NOTE — Progress Notes (Signed)
[facility] PROGRESS NOTE  Daniel Richard URK:[id number] DOB: 26-May-1956 DOA: 11/21/2015 PCP: Cathlean Cower, MD  Brief narrative 59 year old male with history of hypertension, hyperlipidemia, anxiety, low back pain who collapsed while playing piano at church. Performed CPR by bystanders. Received 3 shocks by AED and does of epinephrine. Achieved ROS see within about 15 minutes. Patient was intubated and on epinephrine drip upon arrival to the ED. EKG showed possible anteriolateral ST elevation. Patient taken to cardiac catheterization which showed significant branches disease with occlusion of first diagonal likely contributed into V. fib. Vessel had collaterals with no obstruction on main vessels. Also found to be in A. fib and started on IV heparin. Patient extubated on 12/13 and transferred to hospitalist service on 12/15.  Assessment/Plan: Cardiac arrest Possibly secondary to V. fib with finding of diagonal vessel disease on cardiac cath with global LV dysfunction.. has collaterals and cardiology recommends medical management. - echo showed normal EF with no wall motion abnormality. Currently hemodynamically stable. Increased Coreg dose.. placed on Crestor. Lipid panel shows LDL of 136. Continue Plavix. -Chest soreness improved after adding tramadol. Continue Toradol. -Appreciate cardiology follow-up. Seen by EP who recommended cardiac MRI to evaluate other causes of cardiac arrest. Also plan on ICD placement next week after cardiac MRI evaluated. Patient was extremely claustrophobic and did not tolerate MRI. He is refusing to undergo repeat MRI on Monday. Tried to convince him that I would give him pain medications and anxiolytics prior to MRI so that he would be able to tolerate well. He still undecided. Will discuss with him again tomorrow.  Atrial fibrillation -Heart rate better after adding Cardizem and increaseD Coreg dose. CHADSVASC 2 (HTN, CAD). anticoagulations options  discussed by cardiology and patient prefers NOAC. Switched to [employer]. Plan on DC cardioversion in 3-4 weeks if Afib persists.  Chest pain and soreness Better after adding tramadol. Continue when necessary Toradol. Also added twice a day Xanax and his anxiety has much improved.  AKI/lactic acidosis Possibly secondary to Cardiogenic  shock. Resolved with IV hydration.  hypokalemia Replenished. Normal mg  ? Aspiration pneumonia On empiric vancomycin and Zosyn since 12/12. Cultures negative.  transitioned to Levaquin. Completed 7 day course of antibiotics.    Toxic metabolic encephalopathy Resolved  DVT prophylaxis Eliquis  Diet: Heart healthy   Code Status: full code Family Communication: none at bedside Disposition Plan:home in a.m. if heart rate stable   Consultants:  PC CM  Cardiology  Procedures:  Intubation   Left heart cath  2d echo    Antibiotics:  IV vanco and zosyn 12/12--12/15  levaquin 12/15--12/17  HPI/Subjective:  Seen and examined. Soreness better after medication adjusted. Heart rate improved to 80s-90s. Also reports his anxiety to be better after adding Xanax yesterday. Still very hesitant to have cardiac MRI.  Objective: Filed Vitals:   11/28/15 0500 11/28/15 1425  BP: 100/60 101/65  Pulse: 81 68  Temp: 98 F (36.7 C) 97.7 F (36.5 C)  Resp: 17 18    Intake/Output Summary (Last 24 hours) at 11/28/15 1427 Last data filed at 11/28/15 1235  Gross per 24 hour  Intake   1323 ml  Output      2 ml  Net   1321 ml   Filed Weights   11/26/15 0547 11/27/15 0610 11/28/15 0500  Weight: 78.518 kg (173 lb 1.6 oz) 75.2 kg (165 lb 12.6 oz) 78.744 kg (173 lb 9.6 oz)    Exam:   General:   not in distress  HEENT:moist  oral mucosa  Chest: Clear to auscultation bilaterally, nontender  Cardiovascular:  S1 and S2 irregular, no murmurs rub or gallop  GI: Soft, nondistended, nontender, bowel sounds present  Musculoskeletal:  Warm, no  edema    Data Reviewed: Basic Metabolic Panel:  Recent Labs Lab 11/22/15 0005 11/22/15 0344 11/22/15 0901 11/23/15 0455 11/24/15 0444 11/25/15 0403  NA 141 139 139 137 138 137  K 3.2* 5.0 5.1 3.7 3.7 3.3*  CL 111 110 112* 107 101 100*  CO2 19* 21* '22 25 29 26  '$ GLUCOSE 148* 138* 95 103* 112* 89  BUN '14 14 14 7 7 10  '$ CREATININE 1.05 0.95 0.85 0.76 0.78 0.76  CALCIUM 7.3* 7.9* 7.9* 8.3* 8.6* 8.8*  MG 2.3 1.9  --  1.8 2.1 1.9  PHOS 2.6 2.9  --  2.1* 3.5 3.8   Liver Function Tests: No results for input(s): AST, ALT, ALKPHOS, BILITOT, PROT, ALBUMIN in the last 168 hours. No results for input(s): LIPASE, AMYLASE in the last 168 hours. No results for input(s): AMMONIA in the last 168 hours. CBC:  Recent Labs Lab 11/24/15 0444 11/25/15 0403 11/26/15 0415 11/27/15 0315 11/28/15 0348  WBC 13.7* 11.6* 10.2 9.2 10.1  HGB 12.4* 11.7* 12.3* 12.1* 12.0*  HCT 37.5* 35.9* 37.1* 36.1* 36.5*  MCV 95.7 95.7 95.9 95.5 95.8  PLT 175 200 234 253 274   Cardiac Enzymes: No results for input(s): CKTOTAL, CKMB, CKMBINDEX, TROPONINI in the last 168 hours. BNP (last 3 results) No results for input(s): BNP in the last 8760 hours.  ProBNP (last 3 results) No results for input(s): PROBNP in the last 8760 hours.  CBG:  Recent Labs Lab 11/26/15 1112 11/26/15 1741 11/26/15 2027 11/27/15 0019 11/27/15 0612  GLUCAP 110* 91 115* 94 99    Recent Results (from the past 240 hour(s))  MRSA PCR Screening     Status: None   Collection Time: 11/22/15  2:14 AM  Result Value Ref Range Status   MRSA by PCR NEGATIVE NEGATIVE Final    Comment:        The GeneXpert MRSA Assay (FDA approved for NASAL specimens only), is one component of a comprehensive MRSA colonization surveillance program. It is not intended to diagnose MRSA infection nor to guide or monitor treatment for MRSA infections.   Culture, blood (routine x 2)     Status: None   Collection Time: 11/22/15  6:30 PM  Result  Value Ref Range Status   Specimen Description BLOOD RIGHT ANTECUBITAL  Final   Special Requests BOTTLES DRAWN AEROBIC AND ANAEROBIC 10CC  Final   Culture NO GROWTH 5 DAYS  Final   Report Status 11/27/2015 FINAL  Final  Culture, blood (routine x 2)     Status: None   Collection Time: 11/22/15  6:45 PM  Result Value Ref Range Status   Specimen Description BLOOD RIGHT HAND  Final   Special Requests BOTTLES DRAWN AEROBIC AND ANAEROBIC 10CC  Final   Culture NO GROWTH 5 DAYS  Final   Report Status 11/27/2015 FINAL  Final  Culture, Urine     Status: None   Collection Time: 11/22/15  6:50 PM  Result Value Ref Range Status   Specimen Description URINE, CATHETERIZED  Final   Special Requests Normal  Final   Culture NO GROWTH 1 DAY  Final   Report Status 11/23/2015 FINAL  Final     Studies: Mr Card Morphology W/o Cm  11/27/2015  CLINICAL DATA:  59 year old male post cardiac arrest.  EXAM: CARDIAC MRI TECHNIQUE: The patient was scanned on a 1.5 Tesla GE magnet. A dedicated cardiac coil was used. Functional imaging was done using Fiesta sequences. 2,3, and 4 chamber views were done to assess for RWMA's. Modified Simpson's rule using a short axis stack was used to calculate an ejection fraction on a dedicated work Conservation officer, nature. The patient received no contrast. After 10 minutes inversion recovery sequences were used to assess for infiltration and scar tissue. CONTRAST:  None FINDINGS: This was an incomplete study without use of Gadolinium contrast as the patient was claustrophobic. 1. Normal left ventricular size, thickness and systolic function (LVEF = 55%). There are no regional wall motion abnormalities. LVEDD:  50 mm LVESD:  36 mm LVEDV:  132 ml LVESV:  63 ml SV:  73 ml CO:  8 L/minute Myocardial mass:  135 g 2. Normal right ventricular size, thickness and systolic function (LVEF = 59%) with no regional wall motion abnormalities. 3.  Mild left atrial dilatation. 4.  Trivial mitral and  mild tricuspid regurgitation. 5. Normal size of the aortic root, thoracic aorta and pulmonary artery. IMPRESSION: 1. Normal left ventricular size, thickness and systolic function (LVEF = 55%). There are no regional wall motion abnormalities. 2. Normal right ventricular size, thickness and systolic function (LVEF = 59%) with no regional wall motion abnormalities. We will attempt to perform contrast images on 11/29/2015. Ena Dawley Electronically Signed   By: Ena Dawley   On: 11/27/2015 15:10    Scheduled Meds: . ALPRAZolam  0.25 mg Oral BID  . antiseptic oral rinse  7 mL Mouth Rinse BID  . apixaban  5 mg Oral BID  . carvedilol  25 mg Oral BID WC  . clopidogrel  75 mg Oral Q breakfast  . diltiazem  120 mg Oral Daily  . famotidine  20 mg Oral BID  . levofloxacin  750 mg Oral Daily  . rosuvastatin  40 mg Oral q1800  . sodium chloride  3 mL Intravenous Q12H  . traMADol  50 mg Oral 4 times per day   Continuous Infusions:      Time spent: 25 minutes    Billy Turvey, [place]  [facility] Pager [phone]. If 7PM-7AM, please contact night-coverage at [email or web address], password [facility] 11/28/2015, 2:27 PM  LOS: 7 days

## 2015-11-29 ENCOUNTER — Ambulatory Visit (HOSPITAL_COMMUNITY): Admit: 2015-11-29 | Payer: Self-pay | Admitting: Internal Medicine

## 2015-11-29 ENCOUNTER — Encounter (HOSPITAL_COMMUNITY)
Admission: EM | Disposition: A | Discharge status: Home / Self Care | Payer: Self-pay | Source: Home / Self Care | Attending: Internal Medicine

## 2015-11-29 DIAGNOSIS — Z95810 Presence of automatic (implantable) cardiac defibrillator: Secondary | ICD-10-CM

## 2015-11-29 HISTORY — PX: EP IMPLANTABLE DEVICE: SHX172B

## 2015-11-29 LAB — BASIC METABOLIC PANEL
Anion gap: 9 (ref 5–15)
BUN: 11 mg/dL (ref 6–20)
CO2: 25 mmol/L (ref 22–32)
Calcium: 9.1 mg/dL (ref 8.9–10.3)
Chloride: 102 mmol/L (ref 101–111)
Creatinine, Ser: 0.75 mg/dL (ref 0.61–1.24)
GFR calc Af Amer: >60 mL/min (ref >60)
GFR calc non Af Amer: >60 mL/min (ref >60)
Glucose, Bld: 104 mg/dL — ABNORMAL HIGH (ref 65–99)
Potassium: 4.2 mmol/L (ref 3.5–5.1)
Sodium: 136 mmol/L (ref 135–145)

## 2015-11-29 SURGERY — ICD IMPLANT
Anesthesia: LOCAL

## 2015-11-29 MED ORDER — CEFAZOLIN SODIUM 1-5 GM-% IV SOLN
1.0000 g | Freq: Four times a day (QID) | INTRAVENOUS | Status: AC
  Administered 2015-11-29 – 2015-11-30 (×3): 1 g via INTRAVENOUS
  Filled 2015-11-29 (×4): qty 50

## 2015-11-29 MED ORDER — SODIUM CHLORIDE 0.9 % IV SOLN: INTRAVENOUS | Status: DC

## 2015-11-29 MED ORDER — FENTANYL CITRATE (PF) 100 MCG/2ML IJ SOLN
INTRAMUSCULAR | Status: AC
  Filled 2015-11-29: qty 2

## 2015-11-29 MED ORDER — SODIUM CHLORIDE 0.9 % IR SOLN
Status: DC | PRN
  Administered 2015-11-29: 10:00:00

## 2015-11-29 MED ORDER — EPINEPHRINE HCL 1 MG/ML IJ SOLN
INTRAMUSCULAR | Status: AC
  Filled 2015-11-29: qty 2

## 2015-11-29 MED ORDER — FENTANYL CITRATE (PF) 100 MCG/2ML IJ SOLN
INTRAMUSCULAR | Status: DC | PRN
  Administered 2015-11-29: 25 ug via INTRAVENOUS
  Administered 2015-11-29: 50 ug via INTRAVENOUS
  Administered 2015-11-29 (×3): 25 ug via INTRAVENOUS

## 2015-11-29 MED ORDER — LIDOCAINE HCL (PF) 1 % IJ SOLN
INTRAMUSCULAR | Status: DC | PRN
  Administered 2015-11-29: 31 mL
  Administered 2015-11-29: 20 mL

## 2015-11-29 MED ORDER — SODIUM CHLORIDE 0.9 % IR SOLN
Status: AC
  Filled 2015-11-29: qty 2

## 2015-11-29 MED ORDER — CEFAZOLIN SODIUM-DEXTROSE 2-3 GM-% IV SOLR
INTRAVENOUS | Status: AC
  Filled 2015-11-29: qty 50

## 2015-11-29 MED ORDER — FLECAINIDE ACETATE 100 MG PO TABS
300.0000 mg | ORAL_TABLET | Freq: Once | ORAL | Status: AC
  Administered 2015-11-29: 300 mg via ORAL
  Filled 2015-11-29: qty 3

## 2015-11-29 MED ORDER — LIDOCAINE HCL (PF) 1 % IJ SOLN
INTRAMUSCULAR | Status: AC
  Filled 2015-11-29: qty 30

## 2015-11-29 MED ORDER — ONDANSETRON HCL 4 MG/2ML IJ SOLN
INTRAMUSCULAR | Status: DC | PRN
  Administered 2015-11-29: 4 mg via INTRAVENOUS

## 2015-11-29 MED ORDER — ACETAMINOPHEN 325 MG PO TABS: 325.0000 mg | ORAL_TABLET | ORAL | Status: DC | PRN

## 2015-11-29 MED ORDER — HEPARIN (PORCINE) IN NACL 2-0.9 UNIT/ML-% IJ SOLN
INTRAMUSCULAR | Status: AC
  Filled 2015-11-29: qty 500

## 2015-11-29 MED ORDER — MIDAZOLAM HCL 5 MG/5ML IJ SOLN
INTRAMUSCULAR | Status: AC
  Filled 2015-11-29: qty 5

## 2015-11-29 MED ORDER — MIDAZOLAM HCL 5 MG/5ML IJ SOLN
INTRAMUSCULAR | Status: DC | PRN
  Administered 2015-11-29 (×2): 1 mg via INTRAVENOUS
  Administered 2015-11-29 (×2): 2 mg via INTRAVENOUS
  Administered 2015-11-29: 1 mg via INTRAVENOUS

## 2015-11-29 MED ORDER — CHLORHEXIDINE GLUCONATE 4 % EX LIQD
CUTANEOUS | Status: AC
  Administered 2015-11-29: 06:00:00
  Filled 2015-11-29: qty 15

## 2015-11-29 MED ORDER — ONDANSETRON HCL 4 MG/2ML IJ SOLN
INTRAMUSCULAR | Status: AC
  Filled 2015-11-29: qty 2

## 2015-11-29 MED ORDER — ONDANSETRON HCL 4 MG/2ML IJ SOLN
4.0000 mg | Freq: Four times a day (QID) | INTRAMUSCULAR | Status: DC | PRN
  Filled 2015-11-29: qty 2

## 2015-11-29 MED ORDER — HEPARIN (PORCINE) IN NACL 2-0.9 UNIT/ML-% IJ SOLN
INTRAMUSCULAR | Status: DC | PRN
  Administered 2015-11-29: 09:00:00

## 2015-11-29 MED ORDER — LIDOCAINE HCL (PF) 1 % IJ SOLN
INTRAMUSCULAR | Status: AC
  Filled 2015-11-29: qty 60

## 2015-11-29 MED ORDER — SODIUM CHLORIDE 0.9 % IV SOLN
INTRAVENOUS | Status: DC
  Administered 2015-11-29: 10:00:00 via INTRAVENOUS
  Filled 2015-11-29 (×11): qty 50

## 2015-11-29 SURGICAL SUPPLY — 11 items
CABLE SURGICAL S-101-97-12 (CABLE) ×2 IMPLANT
HEMOSTAT SURGICEL 2X4 FIBR (HEMOSTASIS) ×2 IMPLANT
ICD FORTIFY ASSU VR CD1357-40Q (ICD Generator) ×2 IMPLANT
ICD VISIA MRI VR DVFB1D4 (ICD Generator) ×1 IMPLANT
LEAD DURATA 7121Q-65CM (Lead) ×2 IMPLANT
LEAD SPRINT QUAT SEC 6935M-62 (Lead) ×2 IMPLANT
PAD DEFIB LIFELINK (PAD) ×2 IMPLANT
SHEATH CLASSIC 7F (SHEATH) ×2 IMPLANT
SHEATH CLASSIC 9F (SHEATH) ×4 IMPLANT
TRAY PACEMAKER INSERTION (PACKS) ×2 IMPLANT
VISIA MRI VR DVFB1D4 (ICD Generator) ×2 IMPLANT

## 2015-11-29 NOTE — Progress Notes (Signed)
Obtained EKG 45 minutes post-flecainide dose. QT 368 ms QTc 445 ms

## 2015-11-29 NOTE — Progress Notes (Signed)
Obtained EKG 30 min post-flecainide dose. QT 370 ms QTc 426 ms

## 2015-11-29 NOTE — Progress Notes (Signed)
Flecainide challenge initiated at 21:55. Baseline QT 366 ms Baseline QTc 417 ms  Will proceed per orders.

## 2015-11-29 NOTE — Progress Notes (Signed)
[facility] PROGRESS NOTE  Daniel Richard LOV:[id number] DOB: 20-Nov-1956 DOA: 11/21/2015 PCP: Cathlean Cower, MD  Brief narrative 59 year old male with history of hypertension, hyperlipidemia, anxiety, low back pain who collapsed while playing piano at church. Performed CPR by bystanders. Received 3 shocks by AED and does of epinephrine. Achieved ROS see within about 15 minutes. Patient was intubated and on epinephrine drip upon arrival to the ED. EKG showed possible anteriolateral ST elevation. Patient taken to cardiac catheterization which showed significant branches disease with occlusion of first diagonal likely contributed into V. fib. Vessel had collaterals with no obstruction on main vessels. Also found to be in A. fib and started on IV heparin. Patient extubated on 12/13 and transferred to hospitalist service on 12/15.  Assessment/Plan: Cardiac arrest Possibly secondary to V. fib with finding of diagonal vessel disease on cardiac cath with global LV dysfunction.. has collaterals and cardiology recommends medical management. - echo showed normal EF with no wall motion abnormality. Lipid panel shows LDL of 136. placed on Crestor.  -Continue Plavix. -Chest soreness improved after adding tramadol. Continue Toradol. -A seen by EP. Cardiac MRI was done to rule out other causes of cardiac arrest but patient became very claustrophobic and had to be aborted midway. Noncontrast images were normal. Patient refused for repeat MRI with contrast imaging. -He underwent single-chamber ICD implantation today. Tolerated well.  Atrial fibrillation -Heart rate better after adding Cardizem and increaseD Coreg dose. CHADSVASC 2 (HTN, CAD). anticoagulations options discussed by cardiology and patient prefers NOAC. Switched to [employer]. Plan on DC cardioversion in 3-4 weeks if Afib persists.  Chest pain and soreness Improved with Toradol and adding tramadol. Also added low-dose scheduled Xanax for  anxiety.  AKI/lactic acidosis Possibly secondary to Cardiogenic  shock. Resolved with IV hydration.  hypokalemia Replenished. Normal mg  ? Aspiration pneumonia Completed 7 day of antibiotics. Cultures negative.    Toxic metabolic encephalopathy Resolved  DVT prophylaxis Eliquis  Diet: Heart healthy   Code Status: full code Family Communication: none at bedside Disposition Plan:home on 12/20   Consultants:  PC CM  Cardiology/EP  Procedures:  Intubation   Left heart cath  2d echo  ICD placement on 12/19  Antibiotics:  IV vanco and zosyn 12/12--12/15  levaquin 12/15--12/17  HPI/Subjective: seen and examined after returning from ICD placement. Denies any pain over ICD site. Has some soreness over left ribs  Objective: Filed Vitals:   11/29/15 1244 11/29/15 1425  BP: 93/64 139/78  Pulse:  79  Temp:  97.6 F (36.4 C)  Resp: 13 16    Intake/Output Summary (Last 24 hours) at 11/29/15 1451 Last data filed at 11/29/15 1300  Gross per 24 hour  Intake    600 ml  Output      4 ml  Net    596 ml   Filed Weights   11/27/15 0610 11/28/15 0500 11/29/15 0500  Weight: 75.2 kg (165 lb 12.6 oz) 78.744 kg (173 lb 9.6 oz) 78.245 kg (172 lb 8 oz)    Exam:   General:   not in distress  HEENT:moist oral mucosa  Chest: Clear to auscultation bilaterally, nontender, ICD placement site clean.  Cardiovascular:  S1 and S2 irregular, no murmurs rub or gallop  GI: Soft, nondistended, nontender, bowel sounds present  Musculoskeletal:  Warm, no edema    Data Reviewed: Basic Metabolic Panel:  Recent Labs Lab 11/23/15 0455 11/24/15 0444 11/25/15 0403 11/29/15 0450  NA 137 138 137 136  K 3.7 3.7 3.3* 4.2  CL 107 101 100* 102  CO2 '25 29 26 25  '$ GLUCOSE 103* 112* 89 104*  BUN '7 7 10 11  '$ CREATININE 0.76 0.78 0.76 0.75  CALCIUM 8.3* 8.6* 8.8* 9.1  MG 1.8 2.1 1.9  --   PHOS 2.1* 3.5 3.8  --    Liver Function Tests: No results for input(s): AST, ALT,  ALKPHOS, BILITOT, PROT, ALBUMIN in the last 168 hours. No results for input(s): LIPASE, AMYLASE in the last 168 hours. No results for input(s): AMMONIA in the last 168 hours. CBC:  Recent Labs Lab 11/24/15 0444 11/25/15 0403 11/26/15 0415 11/27/15 0315 11/28/15 0348  WBC 13.7* 11.6* 10.2 9.2 10.1  HGB 12.4* 11.7* 12.3* 12.1* 12.0*  HCT 37.5* 35.9* 37.1* 36.1* 36.5*  MCV 95.7 95.7 95.9 95.5 95.8  PLT 175 200 234 253 274   Cardiac Enzymes: No results for input(s): CKTOTAL, CKMB, CKMBINDEX, TROPONINI in the last 168 hours. BNP (last 3 results) No results for input(s): BNP in the last 8760 hours.  ProBNP (last 3 results) No results for input(s): PROBNP in the last 8760 hours.  CBG:  Recent Labs Lab 11/26/15 1112 11/26/15 1741 11/26/15 2027 11/27/15 0019 11/27/15 0612  GLUCAP 110* 91 115* 94 99    Recent Results (from the past 240 hour(s))  MRSA PCR Screening     Status: None   Collection Time: 11/22/15  2:14 AM  Result Value Ref Range Status   MRSA by PCR NEGATIVE NEGATIVE Final    Comment:        The GeneXpert MRSA Assay (FDA approved for NASAL specimens only), is one component of a comprehensive MRSA colonization surveillance program. It is not intended to diagnose MRSA infection nor to guide or monitor treatment for MRSA infections.   Culture, blood (routine x 2)     Status: None   Collection Time: 11/22/15  6:30 PM  Result Value Ref Range Status   Specimen Description BLOOD RIGHT ANTECUBITAL  Final   Special Requests BOTTLES DRAWN AEROBIC AND ANAEROBIC 10CC  Final   Culture NO GROWTH 5 DAYS  Final   Report Status 11/27/2015 FINAL  Final  Culture, blood (routine x 2)     Status: None   Collection Time: 11/22/15  6:45 PM  Result Value Ref Range Status   Specimen Description BLOOD RIGHT HAND  Final   Special Requests BOTTLES DRAWN AEROBIC AND ANAEROBIC 10CC  Final   Culture NO GROWTH 5 DAYS  Final   Report Status 11/27/2015 FINAL  Final  Culture, Urine      Status: None   Collection Time: 11/22/15  6:50 PM  Result Value Ref Range Status   Specimen Description URINE, CATHETERIZED  Final   Special Requests Normal  Final   Culture NO GROWTH 1 DAY  Final   Report Status 11/23/2015 FINAL  Final     Studies: No results found.  Scheduled Meds: . ALPRAZolam  0.25 mg Oral BID  . antiseptic oral rinse  7 mL Mouth Rinse BID  . apixaban  5 mg Oral BID  . carvedilol  25 mg Oral BID WC  .  ceFAZolin (ANCEF) IV  1 g Intravenous Q6H  . diazepam  5 mg Intravenous Once  . famotidine  20 mg Oral BID  . rosuvastatin  40 mg Oral q1800  . sodium chloride  3 mL Intravenous Q12H  . traMADol  50 mg Oral 4 times per day   Continuous Infusions: . sodium chloride 0.9 % 50 mL with  EPINEPHrine (ADRENALIN) 2 mg infusion Stopped (11/29/15 1044)       Time spent: 25 minutes    Daniel Richard, [place]  [facility] Pager [phone]. If 7PM-7AM, please contact night-coverage at [email or web address], password [facility] 11/29/2015, 2:51 PM  LOS: 8 days

## 2015-11-29 NOTE — Progress Notes (Signed)
Obtained EKG 60 minutes post-flecainide dose. QT 366 ms QTc 422 ms

## 2015-11-29 NOTE — Progress Notes (Addendum)
Obtained EKG 90 minutes post-flecainide dose. QT 378 ms QTc 433 ms  Testing complete. No ectopic rhythms noted during flecainide challenge.  Continuing to monitor.

## 2015-11-29 NOTE — H&P (View-Only) (Signed)
Patient Name: Daniel Richard      SUBJECTIVE: anxious but wtihout chest pain of sob Hx of stress induced palpitations FHx of stress/school induced "falls" daughter#3  Past Medical History  Diagnosis Date  . ALLERGIC RHINITIS   . ANXIETY   . LOW BACK PAIN   . HYPERTENSION   . HYPERLIPIDEMIA     Scheduled Meds:  Scheduled Meds: . ALPRAZolam  0.25 mg Oral BID  . antiseptic oral rinse  7 mL Mouth Rinse BID  . apixaban  5 mg Oral BID  . carvedilol  25 mg Oral BID WC  .  ceFAZolin (ANCEF) IV  2 g Intravenous To Cath  . diazepam  5 mg Intravenous Once  . diltiazem  120 mg Oral Daily  . famotidine  20 mg Oral BID  . gentamicin irrigation  80 mg Irrigation To Cath  . rosuvastatin  40 mg Oral q1800  . sodium chloride  3 mL Intravenous Q12H  . traMADol  50 mg Oral 4 times per day   Continuous Infusions: . sodium chloride 50 mL/hr at 11/29/15 0551   sodium chloride, sodium chloride, acetaminophen (TYLENOL) oral liquid 160 mg/5 mL, acetaminophen, ketorolac, midazolam, sodium chloride    PHYSICAL EXAM Filed Vitals:   11/28/15 1425 11/28/15 1630 11/28/15 2100 11/29/15 0500  BP: 101/65 115/79 120/90 107/70  Pulse: 68 80 86 63  Temp: 97.7 F (36.5 C)  98.4 F (36.9 C) 97.6 F (36.4 C)  TempSrc: Oral     Resp: '18  18 19  '$ Height:      Weight:    172 lb 8 oz (78.245 kg)  SpO2: 97% 98% 97% 96%      Well developed and nourished in no acute distress HENT normal Neck supple with JVP-flat Clear Irregular rate and rhythm, no murmurs or gallops Abd-soft with active BS No Clubbing cyanosis edema Skin-warm and dry A & Oriented  Grossly normal sensory and motor function   TELEMETRY: Reviewed telemetry pt in afib:    Intake/Output Summary (Last 24 hours) at 11/29/15 0806 Last data filed at 11/29/15 0500  Gross per 24 hour  Intake   1080 ml  Output      4 ml  Net   1076 ml    LABS: Basic Metabolic Panel:  Recent Labs Lab 11/22/15 0901   11/23/15 0455 11/24/15 0444 11/25/15 0403 11/29/15 0450  NA 139  --  137 138 137 136  K 5.1  --  3.7 3.7 3.3* 4.2  CL 112*  --  107 101 100* 102  CO2 22  --  '25 29 26 25  '$ GLUCOSE 95  --  103* 112* 89 104*  BUN 14  --  '7 7 10 11  '$ CREATININE 0.85  --  0.76 0.78 0.76 0.75  CALCIUM 7.9*  --  8.3* 8.6* 8.8* 9.1  MG  --   < > 1.8 2.1 1.9  --   PHOS  --   < > 2.1* 3.5 3.8  --   < > = values in this interval not displayed. Cardiac Enzymes: No results for input(s): CKTOTAL, CKMB, CKMBINDEX, TROPONINI in the last 72 hours. CBC:  Recent Labs Lab 11/23/15 0455 11/24/15 0444 11/25/15 0403 11/26/15 0415 11/27/15 0315 11/28/15 0348  WBC 12.6* 13.7* 11.6* 10.2 9.2 10.1  HGB 11.3* 12.4* 11.7* 12.3* 12.1* 12.0*  HCT 35.3* 37.5* 35.9* 37.1* 36.1* 36.5*  MCV 96.2 95.7 95.7 95.9 95.5 95.8  PLT 160 175  200 234 253 274   PROTIME: No results for input(s): LABPROT, INR in the last 72 hours. Liver Function Tests: No results for input(s): AST, ALT, ALKPHOS, BILITOT, PROT, ALBUMIN in the last 72 hours. No results for input(s): LIPASE, AMYLASE in the last 72 hours. BNP: BNP (last 3 results) No results for input(s): BNP in the last 8760 hours.  ProBNP (last 3 results) No results for input(s): PROBNP in the last 8760 hours.  D-Dimer: No results for input(s): DDIMER in the last 72 hours. Hemoglobin A1C: No results for input(s): HGBA1C in the last 72 hours. Fasting Lipid Panel:  Recent Labs  11/27/15 0315  CHOL 181  HDL 26*  LDLCALC 136*  TRIG 96  CHOLHDL 7.0      ASSESSMENT AND PLAN:  Active Problems:   Cardiac arrest (HCC)   Acute respiratory failure with hypoxia (HCC)   Cardiogenic shock (HCC)   Acute encephalopathy   Anoxic encephalopathy (HCC)   Coronary artery disease involving native coronary artery of native heart with angina pectoris (HCC)   Paroxysmal atrial fibrillation (HCC)   Intercostal pain   Hypokalemia   Aspiration pneumonia (HCC)   Sinus tachycardia  (HCC)  Pt for ICD and cardioversion this am Will also plan epi infusion and flec challenge following restoration of sinus rhythm Have reviewed with pt  Signed, Virl Axe MD  11/29/2015

## 2015-11-29 NOTE — Interval H&P Note (Signed)
ICD Criteria  Current LVEF:55%. Within 12 months prior to implant: Yes   Heart failure history: No  Cardiomyopathy history: No.  Atrial Fibrillation/Atrial Flutter: Yes, Paroxysmal.  Ventricular tachycardia history: Yes, Hemodynamic instability present. VT Type: Sustained Ventricular Tachycardia - Monomorphic and Polymorphic.  Cardiac arrest history: Yes, Ventricular Fibrillation.  History of syndromes with risk of sudden death: No.  Previous ICD: No.  Current ICD indication: Secondary  PPM indication: No.   Class I or II Bradycardia indication present: No  Beta Blocker therapy for 3 or more months: No, medical reason.  Ace Inhibitor/ARB therapy for 3 or more months: No, medical reason.  History and Physical Interval Note:  11/29/2015 8:11 AM  Daniel Richard  has presented today for surgery, with the diagnosis of cardiac arrest  The various methods of treatment have been discussed with the patient and family. After consideration of risks, benefits and other options for treatment, the patient has consented to  Procedure(s): ICD Implant (N/A) as a surgical intervention .  The patient's history has been reviewed, patient examined, no change in status, stable for surgery.  I have reviewed the patient's chart and labs.  Questions were answered to the patient's satisfaction.     Virl Axe

## 2015-11-29 NOTE — Progress Notes (Signed)
Atorvastatin infusion was negative for long QT we will give him flecainide tonight to look for Brugada syndrome

## 2015-11-29 NOTE — Progress Notes (Signed)
Patient Name: Daniel Richard      SUBJECTIVE: anxious but wtihout chest pain of sob Hx of stress induced palpitations FHx of stress/school induced "falls" daughter#3  Past Medical History  Diagnosis Date  . ALLERGIC RHINITIS   . ANXIETY   . LOW BACK PAIN   . HYPERTENSION   . HYPERLIPIDEMIA     Scheduled Meds:  Scheduled Meds: . ALPRAZolam  0.25 mg Oral BID  . antiseptic oral rinse  7 mL Mouth Rinse BID  . apixaban  5 mg Oral BID  . carvedilol  25 mg Oral BID WC  .  ceFAZolin (ANCEF) IV  2 g Intravenous To Cath  . diazepam  5 mg Intravenous Once  . diltiazem  120 mg Oral Daily  . famotidine  20 mg Oral BID  . gentamicin irrigation  80 mg Irrigation To Cath  . rosuvastatin  40 mg Oral q1800  . sodium chloride  3 mL Intravenous Q12H  . traMADol  50 mg Oral 4 times per day   Continuous Infusions: . sodium chloride 50 mL/hr at 11/29/15 0551   sodium chloride, sodium chloride, acetaminophen (TYLENOL) oral liquid 160 mg/5 mL, acetaminophen, ketorolac, midazolam, sodium chloride    PHYSICAL EXAM Filed Vitals:   11/28/15 1425 11/28/15 1630 11/28/15 2100 11/29/15 0500  BP: 101/65 115/79 120/90 107/70  Pulse: 68 80 86 63  Temp: 97.7 F (36.5 C)  98.4 F (36.9 C) 97.6 F (36.4 C)  TempSrc: Oral     Resp: '18  18 19  '$ Height:      Weight:    172 lb 8 oz (78.245 kg)  SpO2: 97% 98% 97% 96%      Well developed and nourished in no acute distress HENT normal Neck supple with JVP-flat Clear Irregular rate and rhythm, no murmurs or gallops Abd-soft with active BS No Clubbing cyanosis edema Skin-warm and dry A & Oriented  Grossly normal sensory and motor function   TELEMETRY: Reviewed telemetry pt in afib:    Intake/Output Summary (Last 24 hours) at 11/29/15 0806 Last data filed at 11/29/15 0500  Gross per 24 hour  Intake   1080 ml  Output      4 ml  Net   1076 ml    LABS: Basic Metabolic Panel:  Recent Labs Lab 11/22/15 0901   11/23/15 0455 11/24/15 0444 11/25/15 0403 11/29/15 0450  NA 139  --  137 138 137 136  K 5.1  --  3.7 3.7 3.3* 4.2  CL 112*  --  107 101 100* 102  CO2 22  --  '25 29 26 25  '$ GLUCOSE 95  --  103* 112* 89 104*  BUN 14  --  '7 7 10 11  '$ CREATININE 0.85  --  0.76 0.78 0.76 0.75  CALCIUM 7.9*  --  8.3* 8.6* 8.8* 9.1  MG  --   < > 1.8 2.1 1.9  --   PHOS  --   < > 2.1* 3.5 3.8  --   < > = values in this interval not displayed. Cardiac Enzymes: No results for input(s): CKTOTAL, CKMB, CKMBINDEX, TROPONINI in the last 72 hours. CBC:  Recent Labs Lab 11/23/15 0455 11/24/15 0444 11/25/15 0403 11/26/15 0415 11/27/15 0315 11/28/15 0348  WBC 12.6* 13.7* 11.6* 10.2 9.2 10.1  HGB 11.3* 12.4* 11.7* 12.3* 12.1* 12.0*  HCT 35.3* 37.5* 35.9* 37.1* 36.1* 36.5*  MCV 96.2 95.7 95.7 95.9 95.5 95.8  PLT 160 175  200 234 253 274   PROTIME: No results for input(s): LABPROT, INR in the last 72 hours. Liver Function Tests: No results for input(s): AST, ALT, ALKPHOS, BILITOT, PROT, ALBUMIN in the last 72 hours. No results for input(s): LIPASE, AMYLASE in the last 72 hours. BNP: BNP (last 3 results) No results for input(s): BNP in the last 8760 hours.  ProBNP (last 3 results) No results for input(s): PROBNP in the last 8760 hours.  D-Dimer: No results for input(s): DDIMER in the last 72 hours. Hemoglobin A1C: No results for input(s): HGBA1C in the last 72 hours. Fasting Lipid Panel:  Recent Labs  11/27/15 0315  CHOL 181  HDL 26*  LDLCALC 136*  TRIG 96  CHOLHDL 7.0      ASSESSMENT AND PLAN:  Active Problems:   Cardiac arrest (HCC)   Acute respiratory failure with hypoxia (HCC)   Cardiogenic shock (HCC)   Acute encephalopathy   Anoxic encephalopathy (HCC)   Coronary artery disease involving native coronary artery of native heart with angina pectoris (HCC)   Paroxysmal atrial fibrillation (HCC)   Intercostal pain   Hypokalemia   Aspiration pneumonia (HCC)   Sinus tachycardia  (HCC)  Pt for ICD and cardioversion this am Will also plan epi infusion and flec challenge following restoration of sinus rhythm Have reviewed with pt  Signed, Virl Axe MD  11/29/2015

## 2015-11-29 NOTE — Progress Notes (Signed)
Obtained EKG 75 minutes post-flecainide dose. QT 372 ms QTc 424 ms

## 2015-11-29 NOTE — Progress Notes (Addendum)
Obtained EKG at  ~15 min post-flecainide dose. QT 366 ms QTc 427 ms

## 2015-11-30 ENCOUNTER — Encounter (HOSPITAL_COMMUNITY): Payer: Self-pay | Admitting: Internal Medicine

## 2015-11-30 ENCOUNTER — Inpatient Hospital Stay (HOSPITAL_COMMUNITY): Payer: BLUE CROSS/BLUE SHIELD

## 2015-11-30 DIAGNOSIS — I481 Persistent atrial fibrillation: Secondary | ICD-10-CM

## 2015-11-30 DIAGNOSIS — Z959 Presence of cardiac and vascular implant and graft, unspecified: Secondary | ICD-10-CM

## 2015-11-30 DIAGNOSIS — I10 Essential (primary) hypertension: Secondary | ICD-10-CM

## 2015-11-30 DIAGNOSIS — E785 Hyperlipidemia, unspecified: Secondary | ICD-10-CM

## 2015-11-30 MED ORDER — APIXABAN 5 MG PO TABS: 5.0000 mg | ORAL_TABLET | Freq: Two times a day (BID) | ORAL | Status: DC

## 2015-11-30 MED ORDER — ROSUVASTATIN CALCIUM 40 MG PO TABS: 40.0000 mg | ORAL_TABLET | Freq: Every day | ORAL | Status: DC

## 2015-11-30 MED ORDER — CARVEDILOL 25 MG PO TABS: 25.0000 mg | ORAL_TABLET | Freq: Two times a day (BID) | ORAL | Status: DC

## 2015-11-30 MED ORDER — AMLODIPINE BESYLATE 5 MG PO TABS: 5.0000 mg | ORAL_TABLET | Freq: Every day | ORAL | Status: DC

## 2015-11-30 MED ORDER — TRAMADOL HCL 50 MG PO TABS: 50.0000 mg | ORAL_TABLET | Freq: Four times a day (QID) | ORAL | Status: DC | PRN

## 2015-11-30 MED ORDER — ALPRAZOLAM 0.25 MG PO TABS: 0.2500 mg | ORAL_TABLET | Freq: Two times a day (BID) | ORAL | Status: DC | PRN

## 2015-11-30 NOTE — Plan of Care (Signed)
Problem: Consults Goal: ICD Patient Education (See Patient Education module for education specifics.) Outcome: Progressing ICD site appears somewhat inflamed and purple without hematoma or drainage. Neurovascular status of left upper extremity is baseline without distal edema or pallor.  Patient's post-op pain well-managed at this time. He still has some rib-cage soreness and guarding resulting from CPR; is wary of coughing and sudden movements. Prefers to delay scheduled narcotic doses at this time; will deliver medication when desired.  He was very anxious about flecainide challenge testing tonight; had numerous questions about this. Principally, he was concerned about feeling nauseated after administration of medication. Ondansetron was kept on standby at bedside; not needed.  Vital signs WDL for patient: Filed Vitals:    11/29/15 1244 11/29/15 1315 11/29/15 1425 11/29/15 2021  BP: 93/64 101/61 139/78 138/84  Pulse:     79 85  Temp:     97.6 F (36.4 C) 98.4 F (36.9 C)  TempSrc:     Oral Oral  Resp: '13 22 16 18  '$ Height:          Weight:          SpO2:     96% 98%    Earlier this evening, educated patient and visitors at bedside re:  Plan of care (ICD, ventricular arrhythmia)  Unit routines  Labs/testing (flecainide challenge testing, serial EKG)  Medications  Flecainide  Ondansetron  Cefazolin  Alprazolam  Famotidine  Tramadol  Activity progression/arm movement limitations  Need to call RN for any increasing pain, swelling, arm numbness or tingling, etc.  Discharge planning  No evidence of ICD malfunction at this time. Patient sustaining normal sinus rhythm with incomplete bundle-branch block on telemetry.  Continuing to monitor closely.

## 2015-11-30 NOTE — Progress Notes (Addendum)
SUBJECTIVE: The patient is doing well today.  At this time, he denies chest pain, shortness of breath, or any new concerns.  He reports ambulating in his room and hallways without difficulty or symptoms.  . ALPRAZolam  0.25 mg Oral BID  . apixaban  5 mg Oral BID  . carvedilol  25 mg Oral BID WC  . diazepam  5 mg Intravenous Once  . famotidine  20 mg Oral BID  . rosuvastatin  40 mg Oral q1800  . sodium chloride  3 mL Intravenous Q12H  . traMADol  50 mg Oral 4 times per day      OBJECTIVE: Physical Exam: Filed Vitals:   11/29/15 1425 11/29/15 2021 11/30/15 0039 11/30/15 0448  BP: 139/78 138/84 159/81 151/87  Pulse: 79 85 84 88  Temp: 97.6 F (36.4 C) 98.4 F (36.9 C) 98.5 F (36.9 C) 98.1 F (36.7 C)  TempSrc: Oral Oral Oral Oral  Resp: '16 18 18 20  '$ Height:      Weight:    175 lb 6.4 oz (79.561 kg)  SpO2: 96% 98% 97% 97%    Intake/Output Summary (Last 24 hours) at 11/30/15 0748 Last data filed at 11/30/15 0155  Gross per 24 hour  Intake   1060 ml  Output    500 ml  Net    560 ml    Telemetry reveals sinus rhythm  GEN- The patient is well appearing, alert and oriented x 3 today.   Head- normocephalic, atraumatic Eyes-  Sclera clear, conjunctiva pink Ears- hearing intact Oropharynx- clear Neck- supple, no JVP Lungs- Clear to ausculation bilaterally, normal work of breathing Heart- Regular rate and rhythm, no significant murmurs, no rubs or gallops GI- soft, NT, ND, + BS Extremities- no clubbing, cyanosis, or edema Skin- no rash or lesion, ICD site is stable, + ecchymosis, no hematoma Psych- euthymic mood, full affect Neuro- no gross deficits appreciated  LABS: Basic Metabolic Panel:  Recent Labs  11/29/15 0450  NA 136  K 4.2  CL 102  CO2 25  GLUCOSE 104*  BUN 11  CREATININE 0.75  CALCIUM 9.1   CBC:  Recent Labs  11/28/15 0348  WBC 10.1  HGB 12.0*  HCT 36.5*  MCV 95.8  PLT 274    RADIOLOGY: 11/30/15: Post implant CXR:   FINDINGS: The lungs are better inflated today. There is no pneumothorax. Small bilateral pleural effusions layer posteriorly and laterally. There is no alveolar infiltrate. There is subsegmental atelectasis at the right lung base. The heart is mildly enlarged but has decreased in size since yesterday's study. The central pulmonary vascularity is prominent. There is no pulmonary alveolar edema. The permanent pacemaker defibrillator is in reasonable position radiographically. IMPRESSION: Improved aeration and slight improvement in the pulmonary interstitium consistent with improving CHF. Persistent right basilar atelectasis and small bilateral pleural effusions.   Device information:  St Jude High voltage ICD, serial number [id number].,  defibrillator lead, model St Jude [id number] serial number [id number]  ASSESSMENT AND PLAN:   1. Cardiac arrest     LHC with CAD, medical management (on BB, statin)     Pt unable to complete Cardiac MRI (claustrophobia), though noting normal LV thickness and size, LVEF 55%     LVEF by echo 55-60%, moderate conc LVH (not appreciated on cardiac MRI)     Negative Flecainide challenge     Negative epinephrine infusion for any major change in QT interval     S/p ICD implantation 11/29/15 with Dr. Caryl Comes,  device check this morning with normal function, CXR reviewed by Dr. Curt Bears     Wound care and L arm mobility instructions discussed with the patient     He has been informed no Driving for 6 months, and states understanding     NOTE: the patient was first implanted with MDT device, though DFT testing failed and the RV difib lead and device changed for ST Jude high voltage device.  We Chick Cousins plan to f/u out patient and bring him back for DFT testing with this device.     chest wall discomfort (s/p CPR and AED therapies) is much better, Troponins felt not significant     OK to discharge from EP standpoint, f/u wound check and visit have been arranged for the  patient  2. New PAFib     CHADS2Vasc is at least 2 on Eliquis     Currently SR  3. HTN     Follow, looked well controlled yesterday   Tommye Standard, PA-C 11/30/2015 7:48 AM   I have seen and examined this patient with Tommye Standard.  Agree with above, note added to reflect my findings.  On exam, regular rhythm, no murmers, lungs clear.  ICD placed yesterday.  Had failed DFTs x2 and required St. Jude device with dual coil.  Had atrial fibrillation and was cardioverted during the procedure.  Device functions well today with no abnormalities on CXR.  Had flecainide challenge without changes on ECG worrisome for Brugada.  Javid Kemler plan on discharge and follow up in clinic.  Plan for the patient to return to the hospital for DFT testing at max output due to failed DFTs.  Ofilia Rayon M. Orhan Mayorga MD 11/30/2015 9:41 AM

## 2015-11-30 NOTE — Discharge Summary (Addendum)
Physician Discharge Summary  Daniel Richard OJJ:[id number] DOB: 1956/01/16 DOA: 11/21/2015  PCP: Cathlean Cower, MD  Admit date: 11/21/2015 Discharge date: 11/30/2015  Time spent: 35 minutes  Recommendations for Outpatient Follow-up:  1. Discharge home with outpatient PCP and cardiology follow-up. 2. Patient has an ICD placed and he is being discharged on Eliquis for anticoagulation for his A. Fib. 3. Patient is instructed that he cannot drive any vehicle or operate heavy machinery for 6 months as per state law.   Discharge Diagnoses:  Principal Problem:   Cardiogenic shock ([place])   Active Problems:   Cardiac arrest ([place])   Acute respiratory failure with hypoxia (HCC)   Acute encephalopathy   Anoxic encephalopathy (HCC)   Coronary artery disease involving native coronary artery of native heart with angina pectoris (HCC)   Paroxysmal atrial fibrillation (HCC)   Intercostal pain   Hypokalemia   Aspiration pneumonia (HCC)   Sinus tachycardia (HCC)   S/P ICD (internal cardiac defibrillator) procedure   Hyperlipidemia LDL goal <70   Essential hypertension   Discharge Condition: Fair  Diet recommendation: Heart healthy  Filed Weights   11/28/15 0500 11/29/15 0500 11/30/15 0448  Weight: 78.744 kg (173 lb 9.6 oz) 78.245 kg (172 lb 8 oz) 79.561 kg (175 lb 6.4 oz)    History of present illness:  Please refer to admission H&P for details, in brief,59 year old male with history of hypertension, hyperlipidemia, anxiety, low back pain who collapsed while playing piano at church. Performed CPR by bystanders. Received 3 shocks by AED and does of epinephrine. Achieved ROS see within about 15 minutes. Patient was intubated and on epinephrine drip upon arrival to the ED. EKG showed possible anteriolateral ST elevation. Patient taken to cardiac catheterization which showed significant branches disease with occlusion of first diagonal likely contributed into V. fib. Vessel had collaterals  with no obstruction on main vessels. Also found to be in A. fib and started on IV heparin. Patient extubated on 12/13 and transferred to hospitalist service on 12/15.  Hospital Course:  Cardiac arrest Possibly secondary to V. fib with finding of diagonal vessel disease on cardiac cath with global LV dysfunction.. has collaterals and cardiology recommends medical management. - echo showed normal EF with no wall motion abnormality. Lipid panel shows LDL of 136. placed on Crestor ( reports having muscle aches on zocor previously, tolerating crestor well).  -Continue Plavix. -Chest soreness improved after adding tramadol.  -seen by EP. Cardiac MRI was done to rule out other causes of cardiac arrest but patient became very claustrophobic and had to be aborted midway. Noncontrast images were normal. Patient refused for repeat MRI with contrast imaging. -He underwent single-chamber ICD implantation on 12/19. He was first implanted with MDT device, DFT testing failed and the RV defibrillator lead and device changed for ST Jude high-voltage device. EP plan on follow-up as outpatient for DFT testing with this device. -As instructed above and not drive for 6 months and he understands this clearly..   Atrial fibrillation -New onset -Heart rate better on increased dose of Coreg. Was given Cardizem as well. CHADSVASC 2 (HTN, CAD). anticoagulations options discussed by cardiology and patient prefers NOAC. Switched to [employer]. Plan on DC cardioversion in 3-4 weeks if Afib persists.   Chest pain and soreness Improved with when necessary Toradol and tramadol. Will discharge him on oral tramadol.. Also added low-dose  Xanax for anxiety.  AKI/lactic acidosis Possibly secondary to Cardiogenic shock. Resolved with IV hydration.  hypokalemia Replenished. Normal mg  ?  Aspiration pneumonia Completed 7 day of antibiotics. Cultures negative.  Essential hypertension Blood pressure elevated. Continue Coreg. I  have increased his amlodipine dose.  Toxic metabolic encephalopathy Resolved  Patient is clinically stable to be discharged home with outpatient follow-up.    Code Status: full code Family Communication: none at bedside Disposition Plan:home   Consultants:  PC CM  Cardiology/EP  Procedures:  Intubation  Left heart cath  2d echo  ICD placement on 12/19  Antibiotics:  IV vanco and zosyn 12/12--12/15  levaquin 12/15--12/17  Discharge Exam: Filed Vitals:   11/30/15 0039 11/30/15 0448  BP: 159/81 151/87  Pulse: 84 88  Temp: 98.5 F (36.9 C) 98.1 F (36.7 C)  Resp: 18 39     General:Middle aged male not in distress  HEENT: No pallor, moist oral mucosa  Chest: Clear to auscultation bilaterally, nontender, ICD placement site clean. Able to move his arm without difficulty.  Cardiovascular: S1 and S2 regular, no murmurs rub or gallop  GI: Soft, nondistended, nontender, bowel sounds present  Musculoskeletal: Warm, no edema  Discharge Instructions    Current Discharge Medication List    START taking these medications   Details  ALPRAZolam (XANAX) 0.25 MG tablet Take 1 tablet (0.25 mg total) by mouth 2 (two) times daily as needed for anxiety or sleep. Qty: 15 tablet, Refills: 0    apixaban (ELIQUIS) 5 MG TABS tablet Take 1 tablet (5 mg total) by mouth 2 (two) times daily. Qty: 60 tablet, Refills: 0    carvedilol (COREG) 25 MG tablet Take 1 tablet (25 mg total) by mouth 2 (two) times daily with a meal. Qty: 60 tablet, Refills: 0    rosuvastatin (CRESTOR) 40 MG tablet Take 1 tablet (40 mg total) by mouth daily at 6 PM. Qty: 30 tablet, Refills: 0    traMADol (ULTRAM) 50 MG tablet Take 1 tablet (50 mg total) by mouth every 6 (six) hours as needed for moderate pain. Qty: 40 tablet, Refills: 0      CONTINUE these medications which have CHANGED   Details  amLODipine (NORVASC) 5 MG tablet Take 1 tablet (5 mg total) by mouth daily. Qty: 30 tablet,  Refills: 0      CONTINUE these medications which have NOT CHANGED   Details  aspirin 81 MG EC tablet Take 1 tablet (81 mg total) by mouth daily. Swallow whole. Qty: 30 tablet, Refills: 12    simvastatin (ZOCOR) 20 MG tablet Take 1 tablet (20 mg total) by mouth at bedtime. Qty: 90 tablet, Refills: 3      STOP taking these medications     tiZANidine (ZANAFLEX) 4 MG tablet        Allergies  Allergen Reactions  . Lipitor [Atorvastatin]     myalgia  . Meperidine Hcl    Follow-up Information    Follow up with [facility] On 12/08/2015.   Specialty:  Cardiology   Why:  4:00PM wound check   Contact information:   [address] [place] [phone]      Follow up with Virl Axe, MD On 02/28/2016.   Specialty:  Cardiology   Why:  11:30AM   Contact information:   [address] [place] [address] [phone]       Follow up with Cathlean Cower, MD In 1 week.   Specialties:  Internal Medicine, Radiology   Contact information:   [address] [place] [address] [phone]  The results of significant diagnostics from this hospitalization (including imaging, microbiology, ancillary and laboratory) are listed below for reference.    Significant Diagnostic Studies: Dg Chest 2 View  11/30/2015  CLINICAL DATA:  Status post permanent pacemaker placement EXAM: CHEST  2 VIEW COMPARISON:  Chest x-ray dated November 24, 2015 FINDINGS: The lungs are better inflated today. There is no pneumothorax. Small bilateral pleural effusions layer posteriorly and laterally. There is no alveolar infiltrate. There is subsegmental atelectasis at the right lung base. The heart is mildly enlarged but has decreased in size since yesterday's study. The central pulmonary vascularity is prominent. There is no pulmonary alveolar edema. The permanent pacemaker defibrillator is in reasonable position  radiographically. IMPRESSION: Improved aeration and slight improvement in the pulmonary interstitium consistent with improving CHF. Persistent right basilar atelectasis and small bilateral pleural effusions. Electronically Signed   By: David  Martinique M.D.   On: 11/30/2015 07:52   Dg Chest Port 1 View  11/24/2015  CLINICAL DATA:  Endotracheal tube.  Shortness of breath. EXAM: PORTABLE CHEST 1 VIEW COMPARISON:  11/22/2015; 11/21/2015 ; 11/27/2005 FINDINGS: Grossly unchanged borderline enlarged cardiac silhouette and mediastinal contours given persistently reduced lung volumes. Interval removal of enteric tube. Interval extubation. Stable position of remaining support apparatus. Slight worsening of bilateral medial basilar heterogeneous opacities. Pulmonary venous congestion without frank evidence of edema. No pleural effusion or pneumothorax. Unchanged bones . IMPRESSION: 1. Interval extubation and removal of enteric tube.  No pneumothorax 2. Worsening bilateral medial basilar heterogeneous/consolidative opacities, atelectasis versus infiltrate. Continued attention on follow-up is recommended. 3. Pulmonary venous congestion without frank evidence of edema. Electronically Signed   By: Sandi Mariscal M.D.   On: 11/24/2015 07:14   Dg Chest Port 1 View  11/23/2015  CLINICAL DATA:  Intubation. EXAM: PORTABLE CHEST 1 VIEW COMPARISON:  11/22/2015 . FINDINGS: Endotracheal tube, NG tube, right IJ line in stable position. Heart size stable. Bibasilar subsegmental atelectasis and/or infiltrates. No pleural effusion or pneumothorax . IMPRESSION: 1. Lines and tubes stable position. 2. Mild bibasilar subsegmental atelectasis and/or infiltrates. No interim change. Electronically Signed   By: Marcello Moores  Register   On: 11/23/2015 07:16   Dg Chest Port 1 View  11/22/2015  CLINICAL DATA:  Fever and respiratory failure. EXAM: PORTABLE CHEST 1 VIEW COMPARISON:  11/21/2015 FINDINGS: Mild bibasilar atelectasis present. Endotracheal  tube tip is approximately 3 cm above the carina. Right jugular central line tip stable at the cavoatrial junction. A nasogastric tube extends into the stomach. Temporary pacing pad overlies the left chest. No evidence of pulmonary edema, focal airspace consolidation, pneumothorax or visualized pleural fluid. The heart size is normal. IMPRESSION: Mild bibasilar atelectasis. Electronically Signed   By: Aletta Edouard M.D.   On: 11/22/2015 18:40   Dg Chest Port 1 View  11/21/2015  CLINICAL DATA:  Central venous catheter placement. Initial encounter. EXAM: PORTABLE CHEST 1 VIEW COMPARISON:  Chest radiograph performed earlier today at 7:32 p.m. FINDINGS: The patient's right IJ line is noted ending about the distal SVC. An enteric tube is noted extending below the diaphragm. The patient's endotracheal tube is seen ending 4 cm above the carina. Mild vascular congestion is noted. Mild bilateral atelectasis is seen. No pleural effusion or pneumothorax is identified. The cardiomediastinal silhouette is borderline normal in size. No acute osseous abnormalities are identified. External pacing pads are noted. IMPRESSION: 1. Right IJ line noted ending about the distal SVC. 2. Endotracheal tube seen ending 4 cm above the carina. 3. Mild vascular congestion noted.  Mild bilateral atelectasis seen. Electronically Signed   By: Garald Balding M.D.   On: 11/21/2015 22:30   Dg Chest Portable 1 View  11/21/2015  CLINICAL DATA:  Post CPR and intubation. EXAM: PORTABLE CHEST - 1 VIEW COMPARISON:  Two-view chest x-ray 11/27/2005 FINDINGS: The heart size is exaggerated by low lung volumes. Diffuse interstitial and airspace opacities are noted in the upper lung fields bilaterally, right greater than left. Atherosclerotic calcifications are present in the aorta. The patient is intubated. The endotracheal tube terminates 2.5 cm above the carina and could be pulled back 1-2 cm for more optimal positioning. Pacing pads are in place.  IMPRESSION: 1. Borderline cardiomegaly with diffuse interstitial and airspace opacities suggesting edema. Infection or aspiration is considered less likely. 2. The endotracheal tube terminates 2.5 cm from the carina and could be pulled back 1-2 cm for more optimal positioning. 3. Atherosclerosis. Electronically Signed   By: San Morelle M.D.   On: 11/21/2015 19:47   Mr Card Morphology W/o Cm  11/27/2015  CLINICAL DATA:  59 year old male post cardiac arrest. EXAM: CARDIAC MRI TECHNIQUE: The patient was scanned on a 1.5 Tesla GE magnet. A dedicated cardiac coil was used. Functional imaging was done using Fiesta sequences. 2,3, and 4 chamber views were done to assess for RWMA's. Modified Simpson's rule using a short axis stack was used to calculate an ejection fraction on a dedicated work Conservation officer, nature. The patient received no contrast. After 10 minutes inversion recovery sequences were used to assess for infiltration and scar tissue. CONTRAST:  None FINDINGS: This was an incomplete study without use of Gadolinium contrast as the patient was claustrophobic. 1. Normal left ventricular size, thickness and systolic function (LVEF = 55%). There are no regional wall motion abnormalities. LVEDD:  50 mm LVESD:  36 mm LVEDV:  132 ml LVESV:  63 ml SV:  73 ml CO:  8 L/minute Myocardial mass:  135 g 2. Normal right ventricular size, thickness and systolic function (LVEF = 59%) with no regional wall motion abnormalities. 3.  Mild left atrial dilatation. 4.  Trivial mitral and mild tricuspid regurgitation. 5. Normal size of the aortic root, thoracic aorta and pulmonary artery. IMPRESSION: 1. Normal left ventricular size, thickness and systolic function (LVEF = 55%). There are no regional wall motion abnormalities. 2. Normal right ventricular size, thickness and systolic function (LVEF = 59%) with no regional wall motion abnormalities. We will attempt to perform contrast images on 11/29/2015. Ena Dawley Electronically Signed   By: Ena Dawley   On: 11/27/2015 15:10    Microbiology: Recent Results (from the past 240 hour(s))  MRSA PCR Screening     Status: None   Collection Time: 11/22/15  2:14 AM  Result Value Ref Range Status   MRSA by PCR NEGATIVE NEGATIVE Final    Comment:        The GeneXpert MRSA Assay (FDA approved for NASAL specimens only), is one component of a comprehensive MRSA colonization surveillance program. It is not intended to diagnose MRSA infection nor to guide or monitor treatment for MRSA infections.   Culture, blood (routine x 2)     Status: None   Collection Time: 11/22/15  6:30 PM  Result Value Ref Range Status   Specimen Description BLOOD RIGHT ANTECUBITAL  Final   Special Requests BOTTLES DRAWN AEROBIC AND ANAEROBIC 10CC  Final   Culture NO GROWTH 5 DAYS  Final   Report Status 11/27/2015 FINAL  Final  Culture, blood (routine  x 2)     Status: None   Collection Time: 11/22/15  6:45 PM  Result Value Ref Range Status   Specimen Description BLOOD RIGHT HAND  Final   Special Requests BOTTLES DRAWN AEROBIC AND ANAEROBIC 10CC  Final   Culture NO GROWTH 5 DAYS  Final   Report Status 11/27/2015 FINAL  Final  Culture, Urine     Status: None   Collection Time: 11/22/15  6:50 PM  Result Value Ref Range Status   Specimen Description URINE, CATHETERIZED  Final   Special Requests Normal  Final   Culture NO GROWTH 1 DAY  Final   Report Status 11/23/2015 FINAL  Final     Labs: Basic Metabolic Panel:  Recent Labs Lab 11/24/15 0444 11/25/15 0403 11/29/15 0450  NA 138 137 136  K 3.7 3.3* 4.2  CL 101 100* 102  CO2 '29 26 25  '$ GLUCOSE 112* 89 104*  BUN '7 10 11  '$ CREATININE 0.78 0.76 0.75  CALCIUM 8.6* 8.8* 9.1  MG 2.1 1.9  --   PHOS 3.5 3.8  --    Liver Function Tests: No results for input(s): AST, ALT, ALKPHOS, BILITOT, PROT, ALBUMIN in the last 168 hours. No results for input(s): LIPASE, AMYLASE in the last 168 hours. No results for  input(s): AMMONIA in the last 168 hours. CBC:  Recent Labs Lab 11/24/15 0444 11/25/15 0403 11/26/15 0415 11/27/15 0315 11/28/15 0348  WBC 13.7* 11.6* 10.2 9.2 10.1  HGB 12.4* 11.7* 12.3* 12.1* 12.0*  HCT 37.5* 35.9* 37.1* 36.1* 36.5*  MCV 95.7 95.7 95.9 95.5 95.8  PLT 175 200 234 253 274   Cardiac Enzymes: No results for input(s): CKTOTAL, CKMB, CKMBINDEX, TROPONINI in the last 168 hours. BNP: BNP (last 3 results) No results for input(s): BNP in the last 8760 hours.  ProBNP (last 3 results) No results for input(s): PROBNP in the last 8760 hours.  CBG:  Recent Labs Lab 11/26/15 1112 11/26/15 1741 11/26/15 2027 11/27/15 0019 11/27/15 0612  GLUCAP 110* 91 115* 94 99       Signed:  Albi Rappaport  [facility] 11/30/2015, 10:52 AM

## 2015-12-08 ENCOUNTER — Ambulatory Visit (INDEPENDENT_AMBULATORY_CARE_PROVIDER_SITE_OTHER): Payer: BLUE CROSS/BLUE SHIELD | Admitting: Internal Medicine

## 2015-12-08 ENCOUNTER — Ambulatory Visit (INDEPENDENT_AMBULATORY_CARE_PROVIDER_SITE_OTHER): Payer: BLUE CROSS/BLUE SHIELD | Admitting: *Deleted

## 2015-12-08 ENCOUNTER — Encounter: Payer: Self-pay | Admitting: Internal Medicine

## 2015-12-08 VITALS — BP 126/74 | HR 67 | Temp 98.0°F | Ht 71.0 in | Wt 179.0 lb

## 2015-12-08 DIAGNOSIS — Z959 Presence of cardiac and vascular implant and graft, unspecified: Secondary | ICD-10-CM

## 2015-12-08 DIAGNOSIS — F411 Generalized anxiety disorder: Secondary | ICD-10-CM | POA: Diagnosis not present

## 2015-12-08 DIAGNOSIS — I10 Essential (primary) hypertension: Secondary | ICD-10-CM | POA: Diagnosis not present

## 2015-12-08 DIAGNOSIS — J690 Pneumonitis due to inhalation of food and vomit: Secondary | ICD-10-CM

## 2015-12-08 DIAGNOSIS — Z0000 Encounter for general adult medical examination without abnormal findings: Secondary | ICD-10-CM

## 2015-12-08 DIAGNOSIS — Z0189 Encounter for other specified special examinations: Secondary | ICD-10-CM

## 2015-12-08 DIAGNOSIS — E785 Hyperlipidemia, unspecified: Secondary | ICD-10-CM

## 2015-12-08 DIAGNOSIS — I469 Cardiac arrest, cause unspecified: Secondary | ICD-10-CM

## 2015-12-08 LAB — CUP PACEART INCLINIC DEVICE CHECK
Battery Remaining Longevity: 99.6
Brady Statistic RV Percent Paced: 0 %
Date Time Interrogation Session: 20161228170139
HighPow Impedance: 44.9196
HighPow Impedance: 47 Ohm
Implantable Lead Implant Date: 20161219
Implantable Lead Location: 753860
Lead Channel Impedance Value: 600 Ohm
Lead Channel Pacing Threshold Amplitude: 1 V
Lead Channel Pacing Threshold Amplitude: 1 V
Lead Channel Pacing Threshold Pulse Width: 0.5 ms
Lead Channel Pacing Threshold Pulse Width: 0.5 ms
Lead Channel Sensing Intrinsic Amplitude: 12 mV
Lead Channel Setting Pacing Amplitude: 3.5 V
Lead Channel Setting Pacing Pulse Width: 0.5 ms
Lead Channel Setting Sensing Sensitivity: 0.5 mV
Pulse Gen Serial Number: 7219005

## 2015-12-08 NOTE — Patient Instructions (Signed)
Please continue all other medications as before, and refills have been done if requested.  Please have the pharmacy call with any other refills you may need.  Please continue your efforts at being more active, low cholesterol diet, and weight control.  You are otherwise up to date with prevention measures today.  Please keep your appointments with your specialists as you may have planned  Please return in 6 months, or sooner if needed, with Lab testing done 3-5 days before  

## 2015-12-08 NOTE — Addendum Note (Signed)
Addended by: Kendell Bane on: 12/08/2015 05:02 PM   Modules accepted: Medications

## 2015-12-08 NOTE — Progress Notes (Signed)
Wound check appointment. No steri-strips, Dermabond used. Wound without redness or edema. Incision edges approximated, wound well healed. Normal device function. Threshold, sensing, and impedances consistent with implant measurements. Device programmed at 3.5V for extra safety margin until 3 month visit. Histogram distribution appropriate for patient and level of activity. No ventricular arrhythmias noted. Patient educated about wound care, arm mobility, lifting restrictions, shock plan. ROV w/ SK 02/28/16.

## 2015-12-08 NOTE — Progress Notes (Signed)
Subjective:    Patient ID: Daniel Richard, male    DOB: Jun 28, 1956, 60 y.o.   MRN: [id number]  HPI  Here to f/u recent SCD survived with immediate intervention including CPR per bystanders and EMS; now s/p ICD, overall doing ok,  Pt c/o diffuse chest soreness, but no increasing sob or doe, wheezing, orthopnea, PND, increased LE swelling, palpitations, dizziness or syncope. S/p antibx for aspirate pna without cough, ST, fever.  Pt denies new neurological symptoms such as new headache, or facial or extremity weakness or numbness.  Pt denies polydipsia, polyuria, or low sugar episode.    Pt states overall good compliance with meds, mostly trying to follow appropriate diet, with wt overall stable,  but little exercise however. No new complaints  Denies worsening depressive symptoms, suicidal ideation, or panic; has ongoing mild anxiety Past Medical History  Diagnosis Date  . ALLERGIC RHINITIS   . ANXIETY   . LOW BACK PAIN   . HYPERTENSION   . HYPERLIPIDEMIA    Past Surgical History  Procedure Laterality Date  . Cardiac catheterization N/A 11/21/2015    Procedure: Left Heart Cath and Coronary Angiography;  Surgeon: Wellington Hampshire, MD;  Location: [facility];  Service: Cardiovascular;  Laterality: N/A;  . Ep implantable device N/A 11/29/2015    Procedure: ICD Implant;  Surgeon: Deboraha Sprang, MD;  Location: [facility];  Service: Cardiovascular;  Laterality: N/A;    reports that he quit smoking about 2 years ago. He does not have any smokeless tobacco history on file. He reports that he does not use illicit drugs. His alcohol history is not on file. family history includes Hypertension in his other. Allergies  Allergen Reactions  . Lipitor [Atorvastatin]     myalgia  . Meperidine Hcl    Current Outpatient Prescriptions on File Prior to Visit  Medication Sig Dispense Refill  . ALPRAZolam (XANAX) 0.25 MG tablet Take 1 tablet (0.25 mg total) by mouth 2 (two) times daily as  needed for anxiety or sleep. 15 tablet 0  . amLODipine (NORVASC) 5 MG tablet Take 1 tablet (5 mg total) by mouth daily. 30 tablet 0  . apixaban (ELIQUIS) 5 MG TABS tablet Take 1 tablet (5 mg total) by mouth 2 (two) times daily. 60 tablet 0  . aspirin 81 MG EC tablet Take 1 tablet (81 mg total) by mouth daily. Swallow whole. 30 tablet 12  . carvedilol (COREG) 25 MG tablet Take 1 tablet (25 mg total) by mouth 2 (two) times daily with a meal. 60 tablet 0  . rosuvastatin (CRESTOR) 40 MG tablet Take 1 tablet (40 mg total) by mouth daily at 6 PM. 30 tablet 0  . simvastatin (ZOCOR) 20 MG tablet Take 1 tablet (20 mg total) by mouth at bedtime. (Patient not taking: Reported on 12/08/2015) 90 tablet 3  . traMADol (ULTRAM) 50 MG tablet Take 1 tablet (50 mg total) by mouth every 6 (six) hours as needed for moderate pain. 40 tablet 0   No current facility-administered medications on file prior to visit.   Review of Systems  Constitutional: Negative for unusual diaphoresis or night sweats HENT: Negative for ringing in ear or discharge Eyes: Negative for double vision or worsening visual disturbance.  Respiratory: Negative for choking and stridor.   Gastrointestinal: Negative for vomiting or other signifcant bowel change Genitourinary: Negative for hematuria or change in urine volume.  Musculoskeletal: Negative for other MSK pain or swelling Skin: Negative for color change  and worsening wound.  Neurological: Negative for tremors and numbness other than noted  Psychiatric/Behavioral: Negative for decreased concentration or agitation other than above       Objective:   Physical Exam BP 126/74 mmHg  Pulse 67  Temp(Src) 98 F (36.7 C) (Oral)  Ht '5\' 11"'$  (1.803 m)  Wt 179 lb (81.194 kg)  BMI 24.98 kg/m2  SpO2 97% VS noted,  Constitutional: Pt appears in no significant distress HENT: Head: NCAT.  Right Ear: External ear normal.  Left Ear: External ear normal.  Eyes: . Pupils are equal, round, and  reactive to light. Conjunctivae and EOM are normal Neck: Normal range of motion. Neck supple.  Cardiovascular: Normal rate and regular rhythm.   Pulmonary/Chest: Effort normal and breath sounds without rales or wheezing.  Abd:  Soft, NT, ND, + BS Neurological: Pt is alert. Not confused , motor grossly intact Skin: Skin is warm. No rash, no LE edema, pocket site for icd without erythema, minimal swelling only but wound intact Psychiatric: Pt behavior is normal. No agitation. mild nervous    Assessment & Plan:

## 2015-12-08 NOTE — Progress Notes (Signed)
Pre visit review using our clinic review tool, if applicable. No additional management support is needed unless otherwise documented below in the visit note. 

## 2015-12-09 NOTE — Assessment & Plan Note (Signed)
Now on higher dose crestor, o/w stable overall by history and exam, recent data reviewed with pt, and pt to continue medical treatment as before,  to f/u any worsening symptoms or concerns, fu lab next visit Lab Results  Component Value Date   LDLCALC 136* 11/27/2015

## 2015-12-09 NOTE — Assessment & Plan Note (Signed)
stable overall by history and exam, recent data reviewed with pt, and pt to continue medical treatment as before,  to f/u any worsening symptoms or concerns BP Readings from Last 3 Encounters:  12/08/15 126/74  11/30/15 151/87  06/18/15 126/82

## 2015-12-09 NOTE — Assessment & Plan Note (Signed)
Clinically resolved,  to f/u any worsening symptoms or concerns, no further antibx needed at this time

## 2015-12-09 NOTE — Assessment & Plan Note (Signed)
stable overall by history and exam, recent data reviewed with pt, and pt to continue medical treatment as before,  to f/u any worsening symptoms or concerns Lab Results  Component Value Date   WBC 10.1 11/28/2015   HGB 12.0* 11/28/2015   HCT 36.5* 11/28/2015   PLT 274 11/28/2015   GLUCOSE 104* 11/29/2015   CHOL 181 11/27/2015   TRIG 96 11/27/2015   HDL 26* 11/27/2015   LDLDIRECT 195.1 10/24/2013   LDLCALC 136* 11/27/2015   ALT 23 06/18/2015   AST 28 06/18/2015   NA 136 11/29/2015   K 4.2 11/29/2015   CL 102 11/29/2015   CREATININE 0.75 11/29/2015   BUN 11 11/29/2015   CO2 25 11/29/2015   TSH 2.07 06/18/2015   PSA 0.40 06/18/2015   INR 1.20 11/21/2015

## 2015-12-14 ENCOUNTER — Telehealth: Payer: Self-pay | Admitting: *Deleted

## 2015-12-14 MED ORDER — TRAMADOL HCL 50 MG PO TABS: 50.0000 mg | ORAL_TABLET | Freq: Four times a day (QID) | ORAL | Status: DC | PRN

## 2015-12-14 NOTE — Telephone Encounter (Signed)
Done hardcopy to Dahlia  

## 2015-12-14 NOTE — Telephone Encounter (Signed)
Called pt no answer LMOM rx fax to [facility].../LMB

## 2015-12-14 NOTE — Telephone Encounter (Signed)
Left msg on triage requesting refill on his Tramadol...Daniel Richard

## 2015-12-27 ENCOUNTER — Other Ambulatory Visit: Payer: Self-pay | Admitting: Internal Medicine

## 2015-12-29 ENCOUNTER — Encounter: Payer: Self-pay | Admitting: Internal Medicine

## 2016-01-11 ENCOUNTER — Telehealth: Payer: Self-pay

## 2016-01-11 NOTE — Telephone Encounter (Signed)
Disability form completed, faxed, and a copy sent to scan

## 2016-01-18 ENCOUNTER — Telehealth: Payer: Self-pay

## 2016-01-18 MED ORDER — CARVEDILOL 25 MG PO TABS: 25.0000 mg | ORAL_TABLET | Freq: Two times a day (BID) | ORAL | Status: DC

## 2016-01-18 MED ORDER — ROSUVASTATIN CALCIUM 40 MG PO TABS: 40.0000 mg | ORAL_TABLET | Freq: Every day | ORAL | Status: DC

## 2016-01-18 NOTE — Telephone Encounter (Signed)
Medications sent to pharmacy with 90 day prescription

## 2016-02-02 ENCOUNTER — Telehealth: Payer: Self-pay | Admitting: Internal Medicine

## 2016-02-02 NOTE — Telephone Encounter (Signed)
New message      Pt request to talk to the nurse---he want to go back to work

## 2016-02-02 NOTE — Telephone Encounter (Signed)
Patient called wondering about going back to work Patient has not had cardiology F/U since discharge in December.   Has followed up with PCP and Device clinic Appointment made for patient in [place] with Dr. Caryl Comes for tomorrow at [facility] Patient is aware and very thankful Can't wait to see the doctor that fixed him

## 2016-02-03 ENCOUNTER — Ambulatory Visit (INDEPENDENT_AMBULATORY_CARE_PROVIDER_SITE_OTHER): Payer: BLUE CROSS/BLUE SHIELD | Admitting: Internal Medicine

## 2016-02-03 ENCOUNTER — Encounter: Payer: Self-pay | Admitting: Internal Medicine

## 2016-02-03 VITALS — BP 116/98 | HR 84 | Ht 71.0 in | Wt 179.8 lb

## 2016-02-03 DIAGNOSIS — I469 Cardiac arrest, cause unspecified: Secondary | ICD-10-CM

## 2016-02-03 NOTE — Progress Notes (Signed)
Patient Care Team: Biagio Borg, MD as PCP - General   HPI  Daniel Richard is a 60 y.o. male Seen in follow-up for cardiac arrest that occurred while playing the piano. He received an ICD 12/16. The operative note is incomplete unfortunately. It describes failure to convert on 2 occasions which was true. We then reversed polarity and had first shock success.  Cardiac evaluation initially demonstrated an EF of 40% with an occluded first diagonal with collaterals. Subsequent ejection fraction by echo was 55-60%.  MRI was incomplete without gadolinium because of claustrophobia overall ejection fraction was normal without regional wall motion abnormalities  Hypokalemia had been present on arrival, repeat about 30 minutes later was 3.5.  Records and Results Reviewed hospital records including the flecainide challenge test. I do not see evidence of epinephrine infusion. We will also want to pursue genetic evaluation  Past Medical History  Diagnosis Date  . ALLERGIC RHINITIS   . ANXIETY   . LOW BACK PAIN   . HYPERTENSION   . HYPERLIPIDEMIA     Past Surgical History  Procedure Laterality Date  . Cardiac catheterization N/A 11/21/2015    Procedure: Left Heart Cath and Coronary Angiography;  Surgeon: Wellington Hampshire, MD;  Location: [facility];  Service: Cardiovascular;  Laterality: N/A;  . Ep implantable device N/A 11/29/2015    Procedure: ICD Implant;  Surgeon: Deboraha Sprang, MD;  Location: [facility];  Service: Cardiovascular;  Laterality: N/A;    Current Outpatient Prescriptions  Medication Sig Dispense Refill  . ALPRAZolam (XANAX) 0.25 MG tablet Take 1 tablet (0.25 mg total) by mouth 2 (two) times daily as needed for anxiety or sleep. 15 tablet 0  . amLODipine (NORVASC) 5 MG tablet AKE 1 TABLET BY MOUTH DAILY 30 tablet 3  . aspirin 81 MG EC tablet Take 1 tablet (81 mg total) by mouth daily. Swallow whole. 30 tablet 12  . carvedilol (COREG) 25 MG tablet  Take 1 tablet (25 mg total) by mouth 2 (two) times daily with a meal. 90 tablet 3  . ELIQUIS 5 MG TABS tablet TAKE 1 TABLET (5 MG TOTAL) BY MOUTH 2 (TWO) TIMES DAILY. 60 tablet 3  . rosuvastatin (CRESTOR) 40 MG tablet Take 1 tablet (40 mg total) by mouth daily. 90 tablet 3  . traMADol (ULTRAM) 50 MG tablet Take 1 tablet (50 mg total) by mouth every 6 (six) hours as needed for moderate pain. 60 tablet 1   No current facility-administered medications for this visit.    Allergies  Allergen Reactions  . Lipitor [Atorvastatin]     myalgia  . Meperidine Hcl       Review of Systems negative except from HPI and PMH  Physical Exam BP 116/98 mmHg  Pulse 84  Ht '5\' 11"'$  (1.803 m)  Wt 179 lb 12.8 oz (81.557 kg)  BMI 25.09 kg/m2 Well developed and well nourished in no acute distress HENT normal E scleral and icterus clear Neck Supple JVP flat; carotids brisk and full Clear to ausculation  *Regular rate and rhythm, no murmurs gallops or rub Soft with active bowel sounds No clubbing cyanosis  Edema Alert and oriented, grossly normal motor and sensory function Skin Warm and Dry    \Assessment and  Plan Aborted cardiac arrest  The patient is doing exceedingly well. Blood pressure is well-controlled. No cause of his cardiac arrest has been here today identified. His flecainide challenge was negative. Epinephrine infusion is  pending. Genetic testing will be a undertaken.

## 2016-02-03 NOTE — Patient Instructions (Signed)
Medication Instructions: - no changes  Labwork: - none  Procedures/Testing: - none  Follow-Up: - as scheduled  Any Additional Special Instructions Will Be Listed Below (If Applicable).     If you need a refill on your cardiac medications before your next appointment, please call your pharmacy.

## 2016-02-28 ENCOUNTER — Encounter: Payer: Self-pay | Admitting: Internal Medicine

## 2016-02-28 ENCOUNTER — Ambulatory Visit (INDEPENDENT_AMBULATORY_CARE_PROVIDER_SITE_OTHER): Payer: BLUE CROSS/BLUE SHIELD | Admitting: Internal Medicine

## 2016-02-28 VITALS — BP 147/91 | HR 78 | Ht 71.0 in | Wt 179.4 lb

## 2016-02-28 DIAGNOSIS — I469 Cardiac arrest, cause unspecified: Secondary | ICD-10-CM | POA: Diagnosis not present

## 2016-02-28 DIAGNOSIS — Z95810 Presence of automatic (implantable) cardiac defibrillator: Secondary | ICD-10-CM | POA: Diagnosis not present

## 2016-02-28 NOTE — Progress Notes (Signed)
Patient Care Team: Biagio Borg, MD as PCP - General   HPI  Daniel Richard is a 60 y.o. male Seen in follow-up for cardiac arrest that occurred while playing the piano. He received an [place] ICD 12/16. The operative note is incomplete unfortunately. It describes failure to convert on 2 occasions which was true. We then reversed polarity and had first shock success.  Cardiac evaluation initially demonstrated an EF of 40% with an occluded first diagonal with collaterals. Subsequent ejection fraction by echo was 55-60%.  MRI was incomplete without gadolinium because of claustrophobia overall ejection fraction was normal without regional wall motion abnormalities  Hypokalemia had been present on arrival, repeat about 30 minutes later was 3.5.  Records and Results Reviewed hospital records including the flecainide challenge test. I do not see evidence of epinephrine infusion. We will also want to pursue genetic evaluation  He is noted today to be in atrial fibrillation;  he is having some dizziness. This is positional. He is not otherwise aware of atrial fibrillation.  He is working again; this is gradually improving. He has been struggling with irritability. This relates both issues at work as well as at home. In his wife both worry about OCD and negative thoughts with Perseveration.  No intercurrent palpitations.  Past Medical History  Diagnosis Date  . ALLERGIC RHINITIS   . ANXIETY   . LOW BACK PAIN   . HYPERTENSION   . HYPERLIPIDEMIA   . Cardiac arrest ([place]) 11/21/2015  . Paroxysmal atrial fibrillation (HCC)   . Cardiac device in situ     Past Surgical History  Procedure Laterality Date  . Cardiac catheterization N/A 11/21/2015    Procedure: Left Heart Cath and Coronary Angiography;  Surgeon: Wellington Hampshire, MD;  Location: [facility];  Service: Cardiovascular;  Laterality: N/A;  . Ep implantable device N/A 11/29/2015    Procedure: ICD Implant;  Surgeon:  Deboraha Sprang, MD;  Location: [facility];  Service: Cardiovascular;  Laterality: N/A;    Current Outpatient Prescriptions  Medication Sig Dispense Refill  . amLODipine (NORVASC) 5 MG tablet AKE 1 TABLET BY MOUTH DAILY 30 tablet 3  . carvedilol (COREG) 25 MG tablet Take 1 tablet (25 mg total) by mouth 2 (two) times daily with a meal. 90 tablet 3  . ELIQUIS 5 MG TABS tablet TAKE 1 TABLET (5 MG TOTAL) BY MOUTH 2 (TWO) TIMES DAILY. 60 tablet 3  . rosuvastatin (CRESTOR) 40 MG tablet Take 1 tablet (40 mg total) by mouth daily. 90 tablet 3  . traMADol (ULTRAM) 50 MG tablet Take 1 tablet (50 mg total) by mouth every 6 (six) hours as needed for moderate pain. 60 tablet 1  . ALPRAZolam (XANAX) 0.25 MG tablet Take 1 tablet (0.25 mg total) by mouth 2 (two) times daily as needed for anxiety or sleep. (Patient not taking: Reported on 02/28/2016) 15 tablet 0   No current facility-administered medications for this visit.    Allergies  Allergen Reactions  . Lipitor [Atorvastatin]     myalgia  . Meperidine Hcl       Review of Systems negative except from HPI and PMH  Physical Exam BP 147/91 mmHg  Pulse 78  Ht '5\' 11"'$  (1.803 m)  Wt 179 lb 6.4 oz (81.375 kg)  BMI 25.03 kg/m2 Well developed and well nourished in no acute distress HENT normal E scleral and icterus clear Neck Supple JVP flat; carotids brisk and full  Clear to ausculation  *Regular rate and rhythm, no murmurs gallops or rub Soft with active bowel sounds No clubbing cyanosis  Edema Alert and oriented, grossly normal motor and sensory function Skin Warm and Dry    \Assessment and  Plan Aborted cardiac arrest  Hypertension  Atrial fibrillation  Implantable defibrillator-[employer]  Anxiety/OCD  Dizziness   He has redeveloped atrial fibrillation of which he is unaware. However, his dizziness may be a consequence of this although it may also be a consequence of his blood pressure which at home and reportedly  warnings about 110.  Heart rate is reasonably controlled. We will plan to undertake cardioversion next week if he is not reverted to sinus rhythm on his own. The frequency will determine whether we need to use antiarrhythmic therapy. Given his young age I'm inclined to pursue antiarrhythmic therapy  Hence, we will decrease his carvedilol to 12.5 twice daily.  It is his impression that his visual disturbances are related to his statin therapy; we will undertake a two-week exclusion trial and then resume it is a 2 times per week basis to see if he can tolerate it.  I'm concerned about his psychological healing with his illness. I have suggested that he follow-up with Dr. Jenny Reichmann to consider pharmacological therapy for the short-term. I've also suggested that counseling might be valuable as he tries to deal with the change in his health status

## 2016-02-28 NOTE — Patient Instructions (Signed)
Medication Instructions: 1) Decrease coreg (carvedilol) to 25 mg 1/2 tablet (12.5 mg) by mouth twice daily 2) Stop crestor x 2 weeks to see if symptoms improve, after 2 weeks restart Crestor at one tablet 3 times per week.  Labwork: - none  Procedures/Testing: - Please call the office next week about scheduling a Cardioversion if you think you are still in a-fib. [phone]) [phone] Conn, RN for Dr. Caryl Comes.  Follow-Up: - Remote monitoring is used to monitor your Pacemaker of ICD from home. This monitoring reduces the number of office visits required to check your device to one time per year. It allows [place] to keep an eye on the functioning of your device to ensure it is working properly. You are scheduled for a device check from home on 05/29/16. You may send your transmission at any time that day. If you have a wireless device, the transmission will be sent automatically. After your physician reviews your transmission, you will receive a postcard with your next transmission date.  - Your physician wants you to follow-up in: June 2017 with Dr. Caryl Comes. You will receive a reminder letter in the mail two months in advance. If you don't receive a letter, please call our office to schedule the follow-up appointment.  Any Additional Special Instructions Will Be Listed Below (If Applicable).     If you need a refill on your cardiac medications before your next appointment, please call your pharmacy.

## 2016-03-02 ENCOUNTER — Other Ambulatory Visit (INDEPENDENT_AMBULATORY_CARE_PROVIDER_SITE_OTHER): Payer: BLUE CROSS/BLUE SHIELD

## 2016-03-02 ENCOUNTER — Encounter: Payer: Self-pay | Admitting: Internal Medicine

## 2016-03-02 ENCOUNTER — Ambulatory Visit (INDEPENDENT_AMBULATORY_CARE_PROVIDER_SITE_OTHER): Payer: BLUE CROSS/BLUE SHIELD | Admitting: Internal Medicine

## 2016-03-02 VITALS — BP 130/82 | HR 91 | Resp 20 | Wt 181.0 lb

## 2016-03-02 DIAGNOSIS — R7989 Other specified abnormal findings of blood chemistry: Secondary | ICD-10-CM

## 2016-03-02 DIAGNOSIS — F411 Generalized anxiety disorder: Secondary | ICD-10-CM

## 2016-03-02 DIAGNOSIS — I480 Paroxysmal atrial fibrillation: Secondary | ICD-10-CM | POA: Diagnosis not present

## 2016-03-02 DIAGNOSIS — Z0000 Encounter for general adult medical examination without abnormal findings: Secondary | ICD-10-CM | POA: Diagnosis not present

## 2016-03-02 DIAGNOSIS — I10 Essential (primary) hypertension: Secondary | ICD-10-CM

## 2016-03-02 LAB — URINALYSIS, ROUTINE W REFLEX MICROSCOPIC
Bilirubin Urine: NEGATIVE
Hgb urine dipstick: NEGATIVE
Ketones, ur: NEGATIVE
Leukocytes, UA: NEGATIVE
Nitrite: NEGATIVE
Specific Gravity, Urine: 1.01 (ref 1.000–1.030)
Total Protein, Urine: NEGATIVE
Urine Glucose: NEGATIVE
Urobilinogen, UA: 0.2 (ref 0.0–1.0)
pH: 6 (ref 5.0–8.0)

## 2016-03-02 LAB — CBC WITH DIFFERENTIAL/PLATELET
Basophils Absolute: 0 10*3/uL (ref 0.0–0.1)
Basophils Relative: 0.4 % (ref 0.0–3.0)
Eosinophils Absolute: 0.1 10*3/uL (ref 0.0–0.7)
Eosinophils Relative: 1.2 % (ref 0.0–5.0)
HCT: 42.7 % (ref 39.0–52.0)
Hemoglobin: 14.3 g/dL (ref 13.0–17.0)
Lymphocytes Relative: 38.5 % (ref 12.0–46.0)
Lymphs Abs: 4 10*3/uL (ref 0.7–4.0)
MCHC: 33.6 g/dL (ref 30.0–36.0)
MCV: 89.7 fl (ref 78.0–100.0)
Monocytes Absolute: 1.1 10*3/uL — ABNORMAL HIGH (ref 0.1–1.0)
Monocytes Relative: 10.3 % (ref 3.0–12.0)
Neutro Abs: 5.2 10*3/uL (ref 1.4–7.7)
Neutrophils Relative %: 49.6 % (ref 43.0–77.0)
Platelets: 257 10*3/uL (ref 150.0–400.0)
RBC: 4.76 Mil/uL (ref 4.22–5.81)
RDW: 14.4 % (ref 11.5–15.5)
WBC: 10.5 10*3/uL (ref 4.0–10.5)

## 2016-03-02 MED ORDER — ESCITALOPRAM OXALATE 10 MG PO TABS: 10.0000 mg | ORAL_TABLET | Freq: Every day | ORAL | Status: DC

## 2016-03-02 NOTE — Assessment & Plan Note (Signed)
Mod persistent, for lexapro 10 qd, declines referral counseling, to f/u any worsening symptoms or concerns

## 2016-03-02 NOTE — Assessment & Plan Note (Signed)

## 2016-03-02 NOTE — Assessment & Plan Note (Signed)
stable overall by history and exam, recent data reviewed with pt, and pt to continue medical treatment as before,  to f/u any worsening symptoms or concerns BP Readings from Last 3 Encounters:  03/02/16 130/82  02/28/16 147/91  02/03/16 116/98

## 2016-03-02 NOTE — Assessment & Plan Note (Signed)
Rate controlled, volume ok, stable overall by history and exam, recent data reviewed with pt - mar 20 ecg, and pt to continue medical treatment as before,  to f/u any worsening symptoms or concerns

## 2016-03-02 NOTE — Patient Instructions (Signed)
Please take all new medication as prescribed - the lexapro  Please continue all other medications as before, and refills have been done if requested.  Please have the pharmacy call with any other refills you may need.  Please continue your efforts at being more active, low cholesterol diet, and weight control.  You are otherwise up to date with prevention measures today.  Please keep your appointments with your specialists as you may have planned  Please go to the LAB in the Basement (turn left off the elevator) for the tests to be done today   You will be contacted by phone if any changes need to be made immediately.  Otherwise, you will receive a letter about your results with an explanation, but please check with MyChart first.  Please remember to sign up for MyChart if you have not done so, as this will be important to you in the future with finding out test results, communicating by private email, and scheduling acute appointments online when needed.  Please return in 6 months, or sooner if needed, with Lab testing done 3-5 days before  OK to cancel the June 2017 appt

## 2016-03-02 NOTE — Progress Notes (Signed)
Subjective:    Patient ID: Daniel Richard, male    DOB: 1956-05-02, 60 y.o.   MRN: [id number]  HPI  Here for wellness and f/u;  Overall doing ok;  Pt denies Chest pain, worsening SOB, DOE, wheezing, orthopnea, PND, worsening LE edema, palpitations, dizziness or syncope.  Pt denies neurological change such as new headache, facial or extremity weakness.  Pt denies polydipsia, polyuria, or low sugar symptoms. Pt states overall good compliance with treatment and medications, good tolerability, and has been trying to follow appropriate diet.  Pt denies worsening depressive symptoms, suicidal ideation or panic, but has significant anxiety ongoing.. No fever, night sweats, wt loss, loss of appetite, or other constitutional symptoms.  Pt states good ability with ADL's, has low fall risk, home safety reviewed and adequate, no other significant changes in hearing or vision, and only occasionally active with exercise. Was seen mar 20 per cardiology with dizziness and relative hypotension, coreg decreased. Overall feels much improved today with minimal dizziness, BP improved.   Past Medical History  Diagnosis Date  . ALLERGIC RHINITIS   . ANXIETY   . LOW BACK PAIN   . HYPERTENSION   . HYPERLIPIDEMIA   . Cardiac arrest ([place]) 11/21/2015  . Paroxysmal atrial fibrillation (HCC)   . Cardiac device in situ    Past Surgical History  Procedure Laterality Date  . Cardiac catheterization N/A 11/21/2015    Procedure: Left Heart Cath and Coronary Angiography;  Surgeon: Wellington Hampshire, MD;  Location: [facility];  Service: Cardiovascular;  Laterality: N/A;  . Ep implantable device N/A 11/29/2015    Procedure: ICD Implant;  Surgeon: Deboraha Sprang, MD;  Location: [facility];  Service: Cardiovascular;  Laterality: N/A;    reports that he quit smoking about 3 years ago. He does not have any smokeless tobacco history on file. He reports that he does not use illicit drugs. His alcohol history is not  on file. family history includes Hypertension in his other. Allergies  Allergen Reactions  . Lipitor [Atorvastatin]     myalgia  . Meperidine Hcl    Current Outpatient Prescriptions on File Prior to Visit  Medication Sig Dispense Refill  . ALPRAZolam (XANAX) 0.25 MG tablet Take 1 tablet (0.25 mg total) by mouth 2 (two) times daily as needed for anxiety or sleep. 15 tablet 0  . amLODipine (NORVASC) 5 MG tablet AKE 1 TABLET BY MOUTH DAILY 30 tablet 3  . carvedilol (COREG) 25 MG tablet Take 0.5 tablets (12.5 mg total) by mouth 2 (two) times daily with a meal.    . ELIQUIS 5 MG TABS tablet TAKE 1 TABLET (5 MG TOTAL) BY MOUTH 2 (TWO) TIMES DAILY. 60 tablet 3  . rosuvastatin (CRESTOR) 40 MG tablet Take 1 tablet (40 mg total) by mouth daily. 90 tablet 3  . traMADol (ULTRAM) 50 MG tablet Take 1 tablet (50 mg total) by mouth every 6 (six) hours as needed for moderate pain. 60 tablet 1   No current facility-administered medications on file prior to visit.   Review of Systems Constitutional: Negative for increased diaphoresis, other activity, appetite or siginficant weight change other than noted HENT: Negative for worsening hearing loss, ear pain, facial swelling, mouth sores and neck stiffness.   Eyes: Negative for other worsening pain, redness or visual disturbance.  Respiratory: Negative for shortness of breath and wheezing  Cardiovascular: Negative for chest pain and palpitations.  Gastrointestinal: Negative for diarrhea, blood in stool, abdominal distention or  other pain Genitourinary: Negative for hematuria, flank pain or change in urine volume.  Musculoskeletal: Negative for myalgias or other joint complaints.  Skin: Negative for color change and wound or drainage.  Neurological: Negative for syncope and numbness. other than noted Hematological: Negative for adenopathy. or other swelling Psychiatric/Behavioral: Negative for hallucinations, SI, self-injury, decreased concentration or other  worsening agitation.      Objective:   Physical Exam BP 130/82 mmHg  Pulse 91  Resp 20  Wt 181 lb (82.101 kg)  SpO2 98% VS noted,  Constitutional: Pt is oriented to person, place, and time. Appears well-developed and well-nourished, in no significant distress Head: Normocephalic and atraumatic.  Right Ear: External ear normal.  Left Ear: External ear normal.  Nose: Nose normal.  Mouth/Throat: Oropharynx is clear and moist.  Eyes: Conjunctivae and EOM are normal. Pupils are equal, round, and reactive to light.  Neck: Normal range of motion. Neck supple. No JVD present. No tracheal deviation present or significant neck LA or mass Cardiovascular: Normal rate, irregular rhythm, normal heart sounds and intact distal pulses.   Pulmonary/Chest: Effort normal and breath sounds without rales or wheezing  Abdominal: Soft. Bowel sounds are normal. NT. No HSM  Musculoskeletal: Normal range of motion. Exhibits no edema.  Lymphadenopathy:  Has no cervical adenopathy.  Neurological: Pt is alert and oriented to person, place, and time. Pt has normal reflexes. No cranial nerve deficit. Motor grossly intact Skin: Skin is warm and dry. No rash noted.  Psychiatric:  Has 1-2+ nervous mood and affect. Behavior is normal.      Assessment & Plan:

## 2016-03-02 NOTE — Progress Notes (Signed)
Pre visit review using our clinic review tool, if applicable. No additional management support is needed unless otherwise documented below in the visit note. 

## 2016-03-03 ENCOUNTER — Telehealth: Payer: Self-pay | Admitting: Internal Medicine

## 2016-03-03 LAB — BASIC METABOLIC PANEL
BUN: 11 mg/dL (ref 6–23)
CO2: 29 mEq/L (ref 19–32)
Calcium: 9.8 mg/dL (ref 8.4–10.5)
Chloride: 104 mEq/L (ref 96–112)
Creatinine, Ser: 0.86 mg/dL (ref 0.40–1.50)
GFR: 96.78 mL/min (ref >60.00)
Glucose, Bld: 89 mg/dL (ref 70–99)
Potassium: 4.3 mEq/L (ref 3.5–5.1)
Sodium: 141 mEq/L (ref 135–145)

## 2016-03-03 LAB — HEPATIC FUNCTION PANEL
ALT: 31 U/L (ref 0–53)
AST: 28 U/L (ref 0–37)
Albumin: 4.7 g/dL (ref 3.5–5.2)
Alkaline Phosphatase: 84 U/L (ref 39–117)
Bilirubin, Direct: 0.1 mg/dL (ref 0.0–0.3)
Total Bilirubin: 0.4 mg/dL (ref 0.2–1.2)
Total Protein: 7.7 g/dL (ref 6.0–8.3)

## 2016-03-03 LAB — LIPID PANEL
Cholesterol: 183 mg/dL (ref 0–200)
HDL: 42.9 mg/dL (ref >39.00)
NonHDL: 140.46
Total CHOL/HDL Ratio: 4
Triglycerides: 206 mg/dL — ABNORMAL HIGH (ref 0.0–149.0)
VLDL: 41.2 mg/dL — ABNORMAL HIGH (ref 0.0–40.0)

## 2016-03-03 LAB — LDL CHOLESTEROL, DIRECT: Direct LDL: 108 mg/dL

## 2016-03-03 LAB — TSH: TSH: 1.4 u[IU]/mL (ref 0.35–4.50)

## 2016-03-03 LAB — PSA: PSA: 0.34 ng/mL (ref 0.10–4.00)

## 2016-03-03 NOTE — Telephone Encounter (Addendum)
Calling stating that when he saw Dr. Caryl Comes on 3/20 he suggested call his PCP, Dr. Jenny Reichmann to get medication for anxiety. He states Dr. Jenny Reichmann gave him Lexapro and took dose about 9 PM last night and woke up at 1 AM with sweats and chills and very restless.  States when he got up this AM he was pale.  Is at work now and feels better but is afraid to take the medication.  States he has put in a call to Dr. Jenny Reichmann but has not heard back from them.  He wants Dr. Caryl Comes to be aware of the reaction he had from taking the Lexapro.  Advised that Dr. Jenny Reichmann may be able to switch him to another medication.  Advised Dr. Caryl Comes not in office today but will forward message to him.

## 2016-03-03 NOTE — Telephone Encounter (Signed)
Very unlikely, as these are not common synmptoms reactions to starting the medication  OK to continue medication, and ok to take with other meds if her prefers

## 2016-03-03 NOTE — Telephone Encounter (Signed)
Pt was seen yesterday and was prescribed escitalopram (LEXAPRO) 10 MG tablet [[id number] He took his regular medicine alone with this one last night. He woke up around 1am and he was sweating with the chills and tossed and turned. He woke up this morning and he was very pale.  He had his coffee and breakfast and went to work and he is fine now. He is wondering if this is a one time thing or if there should be change in his medication.  Please advise His best number is [phone]

## 2016-03-03 NOTE — Telephone Encounter (Signed)
Please advise 

## 2016-03-03 NOTE — Telephone Encounter (Signed)
°  New Prob    Pt has some questions regarding his medications. Please call.

## 2016-04-20 ENCOUNTER — Other Ambulatory Visit: Payer: Self-pay | Admitting: Internal Medicine

## 2016-06-07 ENCOUNTER — Encounter: Payer: Self-pay | Admitting: Internal Medicine

## 2016-06-07 ENCOUNTER — Other Ambulatory Visit (INDEPENDENT_AMBULATORY_CARE_PROVIDER_SITE_OTHER): Payer: BLUE CROSS/BLUE SHIELD

## 2016-06-07 ENCOUNTER — Ambulatory Visit (INDEPENDENT_AMBULATORY_CARE_PROVIDER_SITE_OTHER): Payer: BLUE CROSS/BLUE SHIELD | Admitting: Internal Medicine

## 2016-06-07 VITALS — BP 130/72 | HR 88 | Temp 98.5°F | Resp 20 | Wt 182.0 lb

## 2016-06-07 DIAGNOSIS — E785 Hyperlipidemia, unspecified: Secondary | ICD-10-CM | POA: Diagnosis not present

## 2016-06-07 DIAGNOSIS — I10 Essential (primary) hypertension: Secondary | ICD-10-CM | POA: Diagnosis not present

## 2016-06-07 DIAGNOSIS — F411 Generalized anxiety disorder: Secondary | ICD-10-CM

## 2016-06-07 LAB — BASIC METABOLIC PANEL
BUN: 13 mg/dL (ref 6–23)
CO2: 29 mEq/L (ref 19–32)
Calcium: 9.4 mg/dL (ref 8.4–10.5)
Chloride: 102 mEq/L (ref 96–112)
Creatinine, Ser: 0.8 mg/dL (ref 0.40–1.50)
GFR: 105.11 mL/min (ref >60.00)
Glucose, Bld: 85 mg/dL (ref 70–99)
Potassium: 4.2 mEq/L (ref 3.5–5.1)
Sodium: 137 mEq/L (ref 135–145)

## 2016-06-07 LAB — LIPID PANEL
Cholesterol: 126 mg/dL (ref 0–200)
HDL: 37.9 mg/dL — ABNORMAL LOW (ref >39.00)
LDL Cholesterol: 67 mg/dL (ref 0–99)
NonHDL: 88.17
Total CHOL/HDL Ratio: 3
Triglycerides: 107 mg/dL (ref 0.0–149.0)
VLDL: 21.4 mg/dL (ref 0.0–40.0)

## 2016-06-07 LAB — HEPATIC FUNCTION PANEL
ALT: 18 U/L (ref 0–53)
AST: 22 U/L (ref 0–37)
Albumin: 4.3 g/dL (ref 3.5–5.2)
Alkaline Phosphatase: 86 U/L (ref 39–117)
Bilirubin, Direct: 0.1 mg/dL (ref 0.0–0.3)
Total Bilirubin: 0.7 mg/dL (ref 0.2–1.2)
Total Protein: 7.4 g/dL (ref 6.0–8.3)

## 2016-06-07 NOTE — Progress Notes (Signed)
Pre visit review using our clinic review tool, if applicable. No additional management support is needed unless otherwise documented below in the visit note. 

## 2016-06-07 NOTE — Patient Instructions (Addendum)

## 2016-06-07 NOTE — Progress Notes (Signed)
Subjective:    Patient ID: Daniel Richard, male    DOB: 05-29-1956, 60 y.o.   MRN: [id number]  HPI  Here to f/u; overall doing ok,  Pt denies chest pain, increasing sob or doe, wheezing, orthopnea, PND, increased LE swelling, palpitations, dizziness or syncope.  Pt denies new neurological symptoms such as new headache, or facial or extremity weakness or numbness.  Pt denies polydipsia, polyuria, or low sugar episode.   Pt denies new neurological symptoms such as new headache, or facial or extremity weakness or numbness.   Pt states overall good compliance with meds, mostly trying to follow appropriate diet, with wt overall stable,  but little exercise however. Wt Readings from Last 3 Encounters:  06/07/16 182 lb (82.555 kg)  03/02/16 181 lb (82.101 kg)  02/28/16 179 lb 6.4 oz (81.375 kg)   Denies worsening depressive symptoms, suicidal ideation, or panic; has ongoing anxiety, not increased recently, but not improved either, took 1 dose of lexapro after last visit with a severe episode of sweats after so did not take further.   Past Medical History  Diagnosis Date  . ALLERGIC RHINITIS   . ANXIETY   . LOW BACK PAIN   . HYPERTENSION   . HYPERLIPIDEMIA   . Cardiac arrest ([place]) 11/21/2015  . Paroxysmal atrial fibrillation (HCC)   . Cardiac device in situ    Past Surgical History  Procedure Laterality Date  . Cardiac catheterization N/A 11/21/2015    Procedure: Left Heart Cath and Coronary Angiography;  Surgeon: Wellington Hampshire, MD;  Location: [facility];  Service: Cardiovascular;  Laterality: N/A;  . Ep implantable device N/A 11/29/2015    Procedure: ICD Implant;  Surgeon: Deboraha Sprang, MD;  Location: [facility];  Service: Cardiovascular;  Laterality: N/A;    reports that he quit smoking about 3 years ago. He does not have any smokeless tobacco history on file. He reports that he does not use illicit drugs. His alcohol history is not on file. family history includes  Hypertension in his other. Allergies  Allergen Reactions  . Lipitor [Atorvastatin]     myalgia  . Meperidine Hcl    Current Outpatient Prescriptions on File Prior to Visit  Medication Sig Dispense Refill  . amLODipine (NORVASC) 5 MG tablet AKE 1 TABLET BY MOUTH DAILY 30 tablet 3  . carvedilol (COREG) 25 MG tablet Take 0.5 tablets (12.5 mg total) by mouth 2 (two) times daily with a meal.    . ELIQUIS 5 MG TABS tablet TAKE 1 TABLET (5 MG TOTAL) BY MOUTH 2 (TWO) TIMES DAILY. 60 tablet 3  . rosuvastatin (CRESTOR) 40 MG tablet Take 1 tablet (40 mg total) by mouth daily. 90 tablet 3  . traMADol (ULTRAM) 50 MG tablet Take 1 tablet (50 mg total) by mouth every 6 (six) hours as needed for moderate pain. 60 tablet 1   No current facility-administered medications on file prior to visit.   Review of Systems  Constitutional: Negative for unusual diaphoresis or night sweats HENT: Negative for ear swelling or discharge Eyes: Negative for worsening visual haziness  Respiratory: Negative for choking and stridor.   Gastrointestinal: Negative for distension or worsening eructation Genitourinary: Negative for retention or change in urine volume.  Musculoskeletal: Negative for other MSK pain or swelling Skin: Negative for color change and worsening wound Neurological: Negative for tremors and numbness other than noted  Psychiatric/Behavioral: Negative for decreased concentration or agitation other than above  Objective:   Physical Exam BP 130/72 mmHg  Pulse 88  Temp(Src) 98.5 F (36.9 C) (Oral)  Resp 20  Wt 182 lb (82.555 kg)  SpO2 96% VS noted,  Constitutional: Pt appears in no apparent distress HENT: Head: NCAT.  Right Ear: External ear normal.  Left Ear: External ear normal.  Eyes: . Pupils are equal, round, and reactive to light. Conjunctivae and EOM are normal Neck: Normal range of motion. Neck supple.  Cardiovascular: Normal rate and regular rhythm.   Pulmonary/Chest: Effort  normal and breath sounds without rales or wheezing.  Abd:  Soft, NT, ND, + BS Neurological: Pt is alert. Not confused , motor grossly intact Skin: Skin is warm. No rash, no LE edema Psychiatric: Pt behavior is normal. No agitation. mod nervous     Assessment & Plan:

## 2016-06-11 NOTE — Assessment & Plan Note (Signed)
stable overall by history and exam, recent data reviewed with pt, and pt to continue medical treatment as before,  to f/u any worsening symptoms or concerns Lab Results  Component Value Date   LDLCALC 67 06/07/2016

## 2016-06-11 NOTE — Assessment & Plan Note (Signed)
stable overall by history and exam, recent data reviewed with pt, and pt to continue medical treatment as before,  to f/u any worsening symptoms or concerns BP Readings from Last 3 Encounters:  06/07/16 130/72  03/02/16 130/82  02/28/16 147/91

## 2016-06-11 NOTE — Assessment & Plan Note (Signed)
Mod persistent, declines further ssri trial, declines counseling referral,  to f/u any worsening symptoms or concerns

## 2016-07-24 ENCOUNTER — Other Ambulatory Visit: Payer: Self-pay | Admitting: Internal Medicine

## 2016-08-21 ENCOUNTER — Other Ambulatory Visit: Payer: Self-pay | Admitting: Internal Medicine

## 2016-11-28 ENCOUNTER — Ambulatory Visit (INDEPENDENT_AMBULATORY_CARE_PROVIDER_SITE_OTHER): Payer: BLUE CROSS/BLUE SHIELD | Admitting: Internal Medicine

## 2016-11-28 ENCOUNTER — Encounter: Payer: Self-pay | Admitting: Internal Medicine

## 2016-11-28 ENCOUNTER — Other Ambulatory Visit (INDEPENDENT_AMBULATORY_CARE_PROVIDER_SITE_OTHER): Payer: BLUE CROSS/BLUE SHIELD

## 2016-11-28 VITALS — BP 120/76 | HR 103 | Temp 98.1°F | Resp 20 | Wt 187.0 lb

## 2016-11-28 DIAGNOSIS — I10 Essential (primary) hypertension: Secondary | ICD-10-CM | POA: Diagnosis not present

## 2016-11-28 DIAGNOSIS — Z0001 Encounter for general adult medical examination with abnormal findings: Secondary | ICD-10-CM

## 2016-11-28 DIAGNOSIS — J019 Acute sinusitis, unspecified: Secondary | ICD-10-CM | POA: Diagnosis not present

## 2016-11-28 DIAGNOSIS — E785 Hyperlipidemia, unspecified: Secondary | ICD-10-CM | POA: Diagnosis not present

## 2016-11-28 DIAGNOSIS — K4090 Unilateral inguinal hernia, without obstruction or gangrene, not specified as recurrent: Secondary | ICD-10-CM | POA: Diagnosis not present

## 2016-11-28 LAB — CBC WITH DIFFERENTIAL/PLATELET
Basophils Absolute: 0.1 10*3/uL (ref 0.0–0.1)
Basophils Relative: 0.5 % (ref 0.0–3.0)
Eosinophils Absolute: 0.4 10*3/uL (ref 0.0–0.7)
Eosinophils Relative: 3.4 % (ref 0.0–5.0)
HCT: 43.6 % (ref 39.0–52.0)
Hemoglobin: 14.6 g/dL (ref 13.0–17.0)
Lymphocytes Relative: 28.6 % (ref 12.0–46.0)
Lymphs Abs: 3.2 10*3/uL (ref 0.7–4.0)
MCHC: 33.4 g/dL (ref 30.0–36.0)
MCV: 91.2 fl (ref 78.0–100.0)
Monocytes Absolute: 1.4 10*3/uL — ABNORMAL HIGH (ref 0.1–1.0)
Monocytes Relative: 12.3 % — ABNORMAL HIGH (ref 3.0–12.0)
Neutro Abs: 6.3 10*3/uL (ref 1.4–7.7)
Neutrophils Relative %: 55.2 % (ref 43.0–77.0)
Platelets: 273 10*3/uL (ref 150.0–400.0)
RBC: 4.78 Mil/uL (ref 4.22–5.81)
RDW: 14.2 % (ref 11.5–15.5)
WBC: 11.3 10*3/uL — ABNORMAL HIGH (ref 4.0–10.5)

## 2016-11-28 LAB — LIPID PANEL
Cholesterol: 139 mg/dL (ref 0–200)
HDL: 39 mg/dL — ABNORMAL LOW (ref >39.00)
LDL Cholesterol: 61 mg/dL (ref 0–99)
NonHDL: 99.96
Total CHOL/HDL Ratio: 4
Triglycerides: 196 mg/dL — ABNORMAL HIGH (ref 0.0–149.0)
VLDL: 39.2 mg/dL (ref 0.0–40.0)

## 2016-11-28 LAB — URINALYSIS, ROUTINE W REFLEX MICROSCOPIC
Bilirubin Urine: NEGATIVE
Hgb urine dipstick: NEGATIVE
Ketones, ur: NEGATIVE
Leukocytes, UA: NEGATIVE
Nitrite: NEGATIVE
RBC / HPF: NONE SEEN (ref 0–?)
Specific Gravity, Urine: <=1.005 — AB (ref 1.000–1.030)
Total Protein, Urine: NEGATIVE
Urine Glucose: NEGATIVE
Urobilinogen, UA: 0.2 (ref 0.0–1.0)
WBC, UA: NONE SEEN (ref 0–?)
pH: 7 (ref 5.0–8.0)

## 2016-11-28 LAB — BASIC METABOLIC PANEL
BUN: 11 mg/dL (ref 6–23)
CO2: 31 mEq/L (ref 19–32)
Calcium: 9.6 mg/dL (ref 8.4–10.5)
Chloride: 101 mEq/L (ref 96–112)
Creatinine, Ser: 0.71 mg/dL (ref 0.40–1.50)
GFR: 120.43 mL/min (ref >60.00)
Glucose, Bld: 84 mg/dL (ref 70–99)
Potassium: 4.7 mEq/L (ref 3.5–5.1)
Sodium: 139 mEq/L (ref 135–145)

## 2016-11-28 LAB — HEPATIC FUNCTION PANEL
ALT: 22 U/L (ref 0–53)
AST: 25 U/L (ref 0–37)
Albumin: 4.7 g/dL (ref 3.5–5.2)
Alkaline Phosphatase: 106 U/L (ref 39–117)
Bilirubin, Direct: 0.1 mg/dL (ref 0.0–0.3)
Total Bilirubin: 0.5 mg/dL (ref 0.2–1.2)
Total Protein: 7.5 g/dL (ref 6.0–8.3)

## 2016-11-28 MED ORDER — AMOXICILLIN 500 MG PO CAPS: 1000.0000 mg | ORAL_CAPSULE | Freq: Two times a day (BID) | ORAL | 0 refills | Status: DC

## 2016-11-28 NOTE — Patient Instructions (Signed)
Please take all new medication as prescribed - the antibiotic  Please continue all other medications as before, and refills have been done if requested.  Please have the pharmacy call with any other refills you may need.  Please continue your efforts at being more active, low cholesterol diet, and weight control.  You are otherwise up to date with prevention measures today.  Please keep your appointments with your specialists as you may have planned  You will be contacted regarding the referral for: General Surgury  Please go to the LAB in the Basement (turn left off the elevator) for the tests to be done today  You will be contacted by phone if any changes need to be made immediately.  Otherwise, you will receive a letter about your results with an explanation, but please check with MyChart first.  Please remember to sign up for MyChart if you have not done so, as this will be important to you in the future with finding out test results, communicating by private email, and scheduling acute appointments online when needed.

## 2016-11-28 NOTE — Progress Notes (Signed)
Pre visit review using our clinic review tool, if applicable. No additional management support is needed unless otherwise documented below in the visit note. 

## 2016-11-28 NOTE — Progress Notes (Signed)
Letter done

## 2016-11-28 NOTE — Progress Notes (Signed)
Subjective:    Patient ID: Daniel Richard, male    DOB: 1956/03/05, 60 y.o.   MRN: [id number]  HPI   Here with 2-3 days acute onset fever, facial pain, pressure, headache, general weakness and malaise, and greenish d/c, with mild ST and cough, but pt denies chest pain, wheezing, increased sob or doe, orthopnea, PND, increased LE swelling, palpitations, dizziness or syncope. Also with ongoing left inguinal hernia but no worsening size or pain. Denies worsening reflux, abd pain, dysphagia, n/v, bowel change or blood. Pt denies new neurological symptoms such as new headache, or facial or extremity weakness or numbness   Pt denies polydipsia, polyuria,   For labs today, and f/u dec 26 Past Medical History:  Diagnosis Date  . ALLERGIC RHINITIS   . ANXIETY   . Cardiac arrest ([place]) 11/21/2015  . Cardiac device in situ   . HYPERLIPIDEMIA   . HYPERTENSION   . LOW BACK PAIN   . Paroxysmal atrial fibrillation [facility])    Past Surgical History:  Procedure Laterality Date  . CARDIAC CATHETERIZATION N/A 11/21/2015   Procedure: Left Heart Cath and Coronary Angiography;  Surgeon: Wellington Hampshire, MD;  Location: [place] [facility];  Service: Cardiovascular;  Laterality: N/A;  . EP IMPLANTABLE DEVICE N/A 11/29/2015   Procedure: ICD Implant;  Surgeon: Deboraha Sprang, MD;  Location: [place] [facility];  Service: Cardiovascular;  Laterality: N/A;    reports that he quit smoking about 3 years ago. He does not have any smokeless tobacco history on file. He reports that he does not use drugs. His alcohol history is not on file. family history includes Hypertension in his other. Allergies  Allergen Reactions  . Lipitor [Atorvastatin]     myalgia  . Meperidine Hcl    Current Outpatient Prescriptions on File Prior to Visit  Medication Sig Dispense Refill  . amLODipine (NORVASC) 2.5 MG tablet TAKE 1 TABLET BY MOUTH DAILY. 90 tablet 2  . amLODipine (NORVASC) 5 MG tablet AKE 1 TABLET BY MOUTH DAILY 30 tablet  3  . carvedilol (COREG) 25 MG tablet Take 0.5 tablets (12.5 mg total) by mouth 2 (two) times daily with a meal.    . ELIQUIS 5 MG TABS tablet TAKE 1 TABLET BY MOUTH 2 TIMES DAILY 60 tablet 3  . rosuvastatin (CRESTOR) 40 MG tablet Take 1 tablet (40 mg total) by mouth daily. 90 tablet 3  . traMADol (ULTRAM) 50 MG tablet Take 1 tablet (50 mg total) by mouth every 6 (six) hours as needed for moderate pain. 60 tablet 1   No current facility-administered medications on file prior to visit.     Review of Systems  Constitutional: Negative for unusual diaphoresis or night sweats HENT: Negative for ear swelling or discharge Eyes: Negative for worsening visual haziness  Respiratory: Negative for choking and stridor.   Gastrointestinal: Negative for distension or worsening eructation Genitourinary: Negative for retention or change in urine volume.  Musculoskeletal: Negative for other MSK pain or swelling Skin: Negative for color change and worsening wound Neurological: Negative for tremors and numbness other than noted  Psychiatric/Behavioral: Negative for decreased concentration or agitation other than above   All other system neg per pt    Objective:   Physical Exam BP 120/76   Pulse (!) 103   Temp 98.1 F (36.7 C) (Oral)   Resp 20   Wt 187 lb (84.8 kg)   SpO2 97%   BMI 26.08 kg/m  VS noted, mild ill Constitutional:  Pt appears in no apparent distress HENT: Head: NCAT.  Right Ear: External ear normal.  Left Ear: External ear normal.  Eyes: . Pupils are equal, round, and reactive to light. Conjunctivae and EOM are normal Bilat tm's with mild erythema.  Max sinus areas mild tender.  Pharynx with mild erythema, no exudate Neck: Normal range of motion. Neck supple.  Cardiovascular: Normal rate and regular rhythm.   Pulmonary/Chest: Effort normal and breath sounds without rales or wheezing.  Abd:  Soft, NT, ND, + BS + small reducible LIH, nontender Neurological: Pt is alert. Not confused  , motor grossly intact Skin: Skin is warm. No rash, no LE edema Psychiatric: Pt behavior is normal. No agitation.  No other new exam findings    Assessment & Plan:

## 2016-11-29 LAB — PSA: PSA: 0.48 ng/mL (ref 0.10–4.00)

## 2016-11-29 LAB — TSH: TSH: 1.52 u[IU]/mL (ref 0.35–4.50)

## 2016-12-03 NOTE — Assessment & Plan Note (Addendum)
Mild, reducible, without worsening pain, for gen surgury referral for definitive management

## 2016-12-03 NOTE — Assessment & Plan Note (Signed)
Mild to mod, for antibx course,  to f/u any worsening symptoms or concerns 

## 2016-12-03 NOTE — Assessment & Plan Note (Signed)
stable overall by history and exam, recent data reviewed with pt, and pt to continue medical treatment as before,  to f/u any worsening symptoms or concerns BP Readings from Last 3 Encounters:  11/28/16 120/76  06/07/16 130/72  03/02/16 130/82

## 2016-12-03 NOTE — Assessment & Plan Note (Signed)
Lab Results  Component Value Date   LDLCALC 61 11/28/2016   stable overall by history and exam, recent data reviewed with pt, and pt to continue medical treatment as before,  to f/u any worsening symptoms or concerns

## 2016-12-05 ENCOUNTER — Ambulatory Visit (INDEPENDENT_AMBULATORY_CARE_PROVIDER_SITE_OTHER): Payer: BLUE CROSS/BLUE SHIELD | Admitting: Internal Medicine

## 2016-12-05 ENCOUNTER — Encounter: Payer: Self-pay | Admitting: Internal Medicine

## 2016-12-05 DIAGNOSIS — I10 Essential (primary) hypertension: Secondary | ICD-10-CM

## 2016-12-05 DIAGNOSIS — Z0000 Encounter for general adult medical examination without abnormal findings: Secondary | ICD-10-CM | POA: Diagnosis not present

## 2016-12-05 NOTE — Progress Notes (Signed)
Pre visit review using our clinic review tool, if applicable. No additional management support is needed unless otherwise documented below in the visit note. 

## 2016-12-05 NOTE — Assessment & Plan Note (Signed)
stable overall by history and exam, recent data reviewed with pt, and pt to continue medical treatment as before,  to f/u any worsening symptoms or concerns BP Readings from Last 3 Encounters:  12/05/16 120/84  11/28/16 120/76  06/07/16 130/72

## 2016-12-05 NOTE — Patient Instructions (Signed)
Please continue all other medications as before, and refills have been done if requested.  Please have the pharmacy call with any other refills you may need.  Please continue your efforts at being more active, low cholesterol diet, and weight control.  You are otherwise up to date with prevention measures today.  Please call if you change your mind about the colonoscopy, as we would need to refer to GI for office visit to discuss the procedure  Please keep your appointments with your specialists as you may have planned  Please return in 1 year for your yearly visit, or sooner if needed, with Lab testing done 3-5 days before

## 2016-12-05 NOTE — Assessment & Plan Note (Signed)

## 2016-12-05 NOTE — Progress Notes (Signed)
Subjective:    Patient ID: Daniel Richard, male    DOB: 07/09/1956, 60 y.o.   MRN: [id number]  HPI  Here for wellness and f/u;  Overall doing ok;  Pt denies Chest pain, worsening SOB, DOE, wheezing, orthopnea, PND, worsening LE edema, palpitations, dizziness or syncope.  Pt denies neurological change such as new headache, facial or extremity weakness.  Pt denies polydipsia, polyuria, or low sugar symptoms. Pt states overall good compliance with treatment and medications, good tolerability, and has been trying to follow appropriate diet.  Pt denies worsening depressive symptoms, suicidal ideation or panic. No fever, night sweats, wt loss, loss of appetite, or other constitutional symptoms.  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 Was wearing heavy coat and clothes for wt today  Wt Readings from Last 3 Encounters:  12/05/16 192 lb (87.1 kg)  11/28/16 187 lb (84.8 kg)  06/07/16 182 lb (82.6 kg)   Past Medical History:  Diagnosis Date  . ALLERGIC RHINITIS   . ANXIETY   . Cardiac arrest ([place]) 11/21/2015  . Cardiac device in situ   . HYPERLIPIDEMIA   . HYPERTENSION   . LOW BACK PAIN   . Paroxysmal atrial fibrillation [facility])    Past Surgical History:  Procedure Laterality Date  . CARDIAC CATHETERIZATION N/A 11/21/2015   Procedure: Left Heart Cath and Coronary Angiography;  Surgeon: Wellington Hampshire, MD;  Location: [place] [facility];  Service: Cardiovascular;  Laterality: N/A;  . EP IMPLANTABLE DEVICE N/A 11/29/2015   Procedure: ICD Implant;  Surgeon: Deboraha Sprang, MD;  Location: [place] [facility];  Service: Cardiovascular;  Laterality: N/A;    reports that he quit smoking about 3 years ago. He does not have any smokeless tobacco history on file. He reports that he does not use drugs. His alcohol history is not on  file. family history includes Hypertension in his other. Allergies  Allergen Reactions  . Lipitor [Atorvastatin]     myalgia  . Meperidine Hcl    Current Outpatient Prescriptions on File Prior to Visit  Medication Sig Dispense Refill  . amLODipine (NORVASC) 2.5 MG tablet TAKE 1 TABLET BY MOUTH DAILY. 90 tablet 2  . amLODipine (NORVASC) 5 MG tablet AKE 1 TABLET BY MOUTH DAILY 30 tablet 3  . amoxicillin (AMOXIL) 500 MG capsule Take 2 capsules (1,000 mg total) by mouth 2 (two) times daily. 40 capsule 0  . carvedilol (COREG) 25 MG tablet Take 0.5 tablets (12.5 mg total) by mouth 2 (two) times daily with a meal.    . ELIQUIS 5 MG TABS tablet TAKE 1 TABLET BY MOUTH 2 TIMES DAILY 60 tablet 3  . rosuvastatin (CRESTOR) 40 MG tablet Take 1 tablet (40 mg total) by mouth daily. 90 tablet 3  . traMADol (ULTRAM) 50 MG tablet Take 1 tablet (50 mg total) by mouth every 6 (six) hours as needed for moderate pain. 60 tablet 1   No current facility-administered medications on file prior to visit.     Review of Systems Constitutional: Negative for increased diaphoresis, or other activity, appetite or siginficant weight change other than noted HENT: Negative for worsening hearing loss, ear pain, facial swelling, mouth sores and neck stiffness.   Eyes: Negative for other worsening pain, redness or visual disturbance.  Respiratory: Negative for choking or stridor Cardiovascular:  Negative for other chest pain and palpitations.  Gastrointestinal: Negative for worsening diarrhea, blood in stool, or abdominal distention Genitourinary: Negative for hematuria, flank pain or change in urine volume.  Musculoskeletal: Negative for myalgias or other joint complaints.  Skin: Negative for other color change and wound or drainage.  Neurological: Negative for syncope and numbness. other than noted Hematological: Negative for adenopathy. or other swelling Psychiatric/Behavioral: Negative for hallucinations, SI,  self-injury, decreased concentration or other worsening agitation.  All other system neg per pt    Objective:   Physical Exam BP 120/84   Pulse 72   Temp 98.6 F (37 C) (Oral)   Resp 20   Wt 192 lb (87.1 kg)   SpO2 98%   BMI 26.78 kg/m  VS noted,  Constitutional: Pt is oriented to person, place, and time. Appears well-developed and well-nourished, in no significant distress Head: Normocephalic and atraumatic  Eyes: Conjunctivae and EOM are normal. Pupils are equal, round, and reactive to light Right Ear: External ear normal.  Left Ear: External ear normal Nose: Nose normal.  Mouth/Throat: Oropharynx is clear and moist  Neck: Normal range of motion. Neck supple. No JVD present. No tracheal deviation present or significant neck LA or mass Cardiovascular: Normal rate, regular rhythm, normal heart sounds and intact distal pulses.   Pulmonary/Chest: Effort normal and breath sounds without rales or wheezing  Abdominal: Soft. Bowel sounds are normal. NT. No HSM  Musculoskeletal: Normal range of motion. Exhibits no edema Lymphadenopathy: Has no cervical adenopathy.  Neurological: Pt is alert and oriented to person, place, and time. Pt has normal reflexes. No cranial nerve deficit. Motor grossly intact Skin: Skin is warm and dry. No rash noted or new ulcers Psychiatric:  Has normal mood and affect. Behavior is normal.  No other new exam findings       Assessment & Plan:

## 2016-12-21 ENCOUNTER — Other Ambulatory Visit: Payer: Self-pay | Admitting: Internal Medicine

## 2016-12-22 ENCOUNTER — Ambulatory Visit: Payer: Self-pay | Admitting: General Surgery

## 2016-12-22 DIAGNOSIS — Z95810 Presence of automatic (implantable) cardiac defibrillator: Secondary | ICD-10-CM | POA: Diagnosis not present

## 2016-12-22 DIAGNOSIS — Z8674 Personal history of sudden cardiac arrest: Secondary | ICD-10-CM | POA: Diagnosis not present

## 2016-12-22 DIAGNOSIS — K4090 Unilateral inguinal hernia, without obstruction or gangrene, not specified as recurrent: Secondary | ICD-10-CM | POA: Diagnosis not present

## 2016-12-22 DIAGNOSIS — Z7901 Long term (current) use of anticoagulants: Secondary | ICD-10-CM | POA: Diagnosis not present

## 2016-12-22 NOTE — H&P (Signed)
Daniel Richard 12/22/2016 10:21 AM Location: [facility] Patient #: [id number] DOB: Apr 01, 1956 Married / Language: English / Race: White Male   History of Present Illness Daniel Hiss M. Corderius Saraceni MD; 12/22/2016 11:05 AM) Patient words: hernia.  The patient is a 61 year old male who presents with an inguinal hernia. He is referred by Dr. Jenny Reichmann for evaluation of a left inguinal hernia. He had noticed some occasional ache in his left groin mainly toward the end of the work day. However he had an upper respiratory issue in December with lots of coughing and it bothered him a little bit more frequently. He was found to have a left inguinal hernia. He describes the discomfort as an ache or pull sensation. He denies any burning, shooting, or stabbing pain. He has a history of sudden cardiac arrest. He has an ICD. He denies that it is fired since insertion. He is on an oral blood thinner for paroxysmal atrial fibrillation. He denies tobacco, chest pain, chest pressure, source of breath, TIAs or amaurosis fugax.   Problem List/Past Medical Daniel Hiss M. Redmond Pulling, MD; 12/22/2016 11:05 AM) H/O CARDIAC ARREST (Z86.74)  ICD (IMPLANTABLE CARDIOVERTER-DEFIBRILLATOR) IN PLACE (Z95.810)  LEFT INGUINAL HERNIA (K40.90)  ANTICOAGULATED (Z79.01)   Past Surgical History (Ammie Eversole, LPN; 03/19/8118 14:78 AM) No pertinent past surgical history   Diagnostic Studies History (Ammie Eversole, LPN; 2/95/6213 08:65 AM) Colonoscopy  never  Allergies (Ammie Eversole, LPN; 7/84/6962 95:28 AM) Demerol *ANALGESICS - OPIOID*   Medication History (Ammie Eversole, LPN; 03/24/2439 10:27 AM) AmLODIPine Besylate (2.'5MG'$  Tablet, Oral) Active. Amoxicillin ('500MG'$  Capsule, Oral) Active. Carvedilol ('25MG'$  Tablet, Oral) Active. Eliquis ('5MG'$  Tablet, Oral) Active. Rosuvastatin Calcium ('40MG'$  Tablet, Oral) Active. Medications Reconciled  Social History (Ammie Eversole, LPN; 2/53/6644 03:47 AM) Alcohol use   Recently quit alcohol use. Caffeine use  Coffee. No drug use  Tobacco use  Former smoker.  Family History Aleatha Borer, LPN; 04/04/9562 87:56 AM) Arthritis  Brother, Father, Mother, Son. Breast Cancer  Mother. Cerebrovascular Accident  Mother. Diabetes Mellitus  Mother. Heart Disease  Brother, Father. Heart disease in male family member before age 69  Heart disease in male family member before age 73  Hypertension  Brother, Father, Mother. Respiratory Condition  Brother. Thyroid problems  Mother.  Other Problems Daniel Hiss M. Redmond Pulling, MD; 12/22/2016 11:05 AM) Inguinal Hernia  Myocardial infarction     Review of Systems (Ammie Eversole LPN; 4/33/2951 88:41 AM) General Not Present- Appetite Loss, Chills, Fatigue, Fever, Night Sweats, Weight Gain and Weight Loss. Skin Not Present- Change in Wart/Mole, Dryness, Hives, Jaundice, New Lesions, Non-Healing Wounds, Rash and Ulcer. HEENT Present- Ringing in the Ears and Wears glasses/contact lenses. Not Present- Earache, Hearing Loss, Hoarseness, Nose Bleed, Oral Ulcers, Seasonal Allergies, Sinus Pain, Sore Throat, Visual Disturbances and Yellow Eyes. Breast Not Present- Breast Mass, Breast Pain, Nipple Discharge and Skin Changes. Cardiovascular Not Present- Chest Pain, Difficulty Breathing Lying Down, Leg Cramps, Palpitations, Rapid Heart Rate, Shortness of Breath and Swelling of Extremities. Gastrointestinal Present- Hemorrhoids. Not Present- Abdominal Pain, Bloating, Bloody Stool, Change in Bowel Habits, Chronic diarrhea, Constipation, Difficulty Swallowing, Excessive gas, Gets full quickly at meals, Indigestion, Nausea, Rectal Pain and Vomiting. Male Genitourinary Not Present- Blood in Urine, Change in Urinary Stream, Frequency, Impotence, Nocturia, Painful Urination, Urgency and Urine Leakage. Musculoskeletal Not Present- Back Pain, Joint Pain, Joint Stiffness, Muscle Pain, Muscle Weakness and Swelling of  Extremities. Neurological Not Present- Decreased Memory, Fainting, Headaches, Numbness, Seizures, Tingling, Tremor, Trouble walking and Weakness. Psychiatric Not Present- Anxiety, Bipolar,  Change in Sleep Pattern, Depression, Fearful and Frequent crying. Endocrine Not Present- Cold Intolerance, Excessive Hunger, Hair Changes, Heat Intolerance, Hot flashes and New Diabetes. Hematology Present- Blood Thinners. Not Present- Easy Bruising, Excessive bleeding, Gland problems, HIV and Persistent Infections.  Vitals (Ammie Eversole LPN; 9/48/5462 70:35 AM) 12/22/2016 10:21 AM Weight: 187.6 lb Height: 72in Body Surface Area: 2.07 m Body Mass Index: 25.44 kg/m  Temp.: 98.62F(Oral)  Pulse: 86 (Regular)  BP: 124/78 (Sitting, Left Arm, Standard)       Physical Exam Daniel Hiss M. Tiny Chaudhary MD; 12/22/2016 11:04 AM) General Mental Status-Alert. General Appearance-Consistent with stated age. Hydration-Well hydrated. Voice-Normal.  Head and Neck Head-normocephalic, atraumatic with no lesions or palpable masses. Trachea-midline. Thyroid Gland Characteristics - normal size and consistency.  Eye Eyeball - Bilateral-Extraocular movements intact. Sclera/Conjunctiva - Bilateral-No scleral icterus.  Chest and Lung Exam Chest and lung exam reveals -quiet, even and easy respiratory effort with no use of accessory muscles and on auscultation, normal breath sounds, no adventitious sounds and normal vocal resonance. Inspection Chest Wall - Normal. Back - normal.  Breast - Did not examine.  Cardiovascular Cardiovascular examination reveals -normal heart sounds, regular rate and rhythm with no murmurs and normal pedal pulses bilaterally.  Abdomen Inspection Inspection of the abdomen reveals - No Hernias. Skin - Scar - no surgical scars. Palpation/Percussion Palpation and Percussion of the abdomen reveal - Soft, Non Tender, No Rebound tenderness, No Rigidity (guarding) and  No hepatosplenomegaly. Auscultation Auscultation of the abdomen reveals - Bowel sounds normal.  Male Genitourinary Note: Both testicles down. No scrotal testicular masses. No bulge or impulse with Valsalva in right groin. Easily reducible left inguinal hernia   Peripheral Vascular Upper Extremity Palpation - Pulses bilaterally normal.  Neurologic Neurologic evaluation reveals -alert and oriented x 3 with no impairment of recent or remote memory. Mental Status-Normal.  Neuropsychiatric The patient's mood and affect are described as -normal. Judgment and Insight-insight is appropriate concerning matters relevant to self.  Musculoskeletal Normal Exam - Left-Upper Extremity Strength Normal and Lower Extremity Strength Normal. Normal Exam - Right-Upper Extremity Strength Normal and Lower Extremity Strength Normal.  Lymphatic Head & Neck  General Head & Neck Lymphatics: Bilateral - Description - Normal. Axillary - Did not examine. Femoral & Inguinal - Did not examine.    Assessment & Plan Daniel Hiss M. Lindwood Mogel MD; 12/22/2016 11:03 AM) LEFT INGUINAL HERNIA (K40.90) Impression: We discussed the etiology of inguinal hernias. We discussed the signs & symptoms of incarceration & strangulation. We discussed non-operative and operative management. recommended an open approach given anticoagulation status  The patient has elected to proceed with open repair of Left inguinal hernia with mesh   I described the procedure in detail. The patient was given educational material. We discussed the risks and benefits including but not limited to bleeding, infection, chronic inguinal pain, nerve entrapment, hernia recurrence, mesh complications, hematoma formation, urinary retention, injury to the testicle, numbness in the groin, blood clots, injury to the surrounding structures, and anesthesia risk. We also discussed the typical post operative recovery course, including no heavy lifting for 4-6  weeks. I explained that the likelihood of improvement of their symptoms is good  explained that he is at higher risk for hematoma/bleeding given blood thinner. Current Plans I recommended obtaining preoperative cardiac clearance. I am concerned about the health of the patient and the ability to tolerate the operation. Therefore, we will request clearance by cardiology to better assess operative risk & see if a reevaluation, further workup, etc is needed.  Also recommendations on how medications such as for anticoagulation and blood pressure should be managed/held/restarted after surgery. Pt Education - Pamphlet Given - Hernia Surgery: discussed with patient and provided information. H/O CARDIAC ARREST (Z86.74) Impression: will need cardiac clearance ICD (IMPLANTABLE CARDIOVERTER-DEFIBRILLATOR) IN PLACE (Z95.810) ANTICOAGULATED (Z79.01) Impression: will need permission to hold blood thinner perioperatively  [name]. Redmond Pulling, MD, FACS General, Bariatric, & Minimally Invasive Surgery [facility], [place]

## 2017-01-02 ENCOUNTER — Ambulatory Visit (INDEPENDENT_AMBULATORY_CARE_PROVIDER_SITE_OTHER): Payer: BLUE CROSS/BLUE SHIELD | Admitting: Internal Medicine

## 2017-01-02 ENCOUNTER — Encounter: Payer: Self-pay | Admitting: Internal Medicine

## 2017-01-02 VITALS — BP 122/82 | HR 90 | Ht 72.0 in | Wt 186.4 lb

## 2017-01-02 DIAGNOSIS — I480 Paroxysmal atrial fibrillation: Secondary | ICD-10-CM | POA: Diagnosis not present

## 2017-01-02 DIAGNOSIS — Z95810 Presence of automatic (implantable) cardiac defibrillator: Secondary | ICD-10-CM | POA: Diagnosis not present

## 2017-01-02 DIAGNOSIS — I469 Cardiac arrest, cause unspecified: Secondary | ICD-10-CM | POA: Diagnosis not present

## 2017-01-02 MED ORDER — CARVEDILOL 25 MG PO TABS: 12.5000 mg | ORAL_TABLET | Freq: Two times a day (BID) | ORAL | 3 refills | Status: DC

## 2017-01-02 NOTE — Progress Notes (Signed)
Patient Care Team: Biagio Borg, MD as PCP - General   HPI  Daniel Richard is a 61 y.o. male Seen in follow-up for cardiac arrest that occurred while playing the piano. He received an [place] ICD 12/16. The operative note is incomplete unfortunately. It describes failure to convert on 2 occasions which was true. We then reversed polarity and had first shock success.  Cardiac evaluation initially demonstrated an EF of 40% with an occluded first diagonal with collaterals. Subsequent ejection fraction by echo was 55-60%.  MRI was incomplete without gadolinium because of claustrophobia overall ejection fraction was normal without regional wall motion abnormalities   He has had some atrial fib of which  he is largely unaware  He is working again painting playing the piano and feels great;  no bleeding issues. He has been found to have   hernia which needs surgical repair    No intercurrent palpitations.  Past Medical History:  Diagnosis Date  . ALLERGIC RHINITIS   . ANXIETY   . Cardiac arrest ([place]) 11/21/2015  . Cardiac device in situ   . HYPERLIPIDEMIA   . HYPERTENSION   . LOW BACK PAIN   . Paroxysmal atrial fibrillation [facility])     Past Surgical History:  Procedure Laterality Date  . CARDIAC CATHETERIZATION N/A 11/21/2015   Procedure: Left Heart Cath and Coronary Angiography;  Surgeon: Wellington Hampshire, MD;  Location: [place] [facility];  Service: Cardiovascular;  Laterality: N/A;  . EP IMPLANTABLE DEVICE N/A 11/29/2015   Procedure: ICD Implant;  Surgeon: Deboraha Sprang, MD;  Location: [place] [facility];  Service: Cardiovascular;  Laterality: N/A;    Current Outpatient Prescriptions  Medication Sig Dispense Refill  . amLODipine (NORVASC) 2.5 MG tablet TAKE 1 TABLET BY MOUTH DAILY. 90 tablet 2  . carvedilol (COREG) 25 MG tablet Take 0.5 tablets (12.5 mg total) by mouth 2 (two) times daily with a meal.    . ELIQUIS 5 MG TABS tablet TAKE 1 TABLET BY MOUTH 2 TIMES  DAILY 60 tablet 3  . rosuvastatin (CRESTOR) 40 MG tablet Take 1 tablet (40 mg total) by mouth daily. 90 tablet 3   No current facility-administered medications for this visit.     Allergies  Allergen Reactions  . Lipitor [Atorvastatin]     myalgia  . Meperidine Hcl Other (See Comments)    Unknown reaction      Review of Systems negative except from HPI and PMH  Physical Exam BP 122/82   Pulse 90   Ht 6' (1.829 m)   Wt 186 lb 6.4 oz (84.6 kg)   SpO2 98%   BMI 25.28 kg/m  Well developed and well nourished in no acute distress HENT normal E scleral and icterus clear Neck Supple JVP flat; carotids brisk and full Clear to ausculation  *Regular rate and rhythm, no murmurs gallops or rub Soft with active bowel sounds No clubbing cyanosis  Edema Alert and oriented, grossly normal motor and sensory function Skin Warm and Dry  ECG demonstrates atrial fibrillation at a rate of 88 Intervals ax-/10/35  \Assessment and  Plan Aborted cardiac arrest  Hypertension  Atrial fibrillation-persistent  Implantable defibrillator-St. Jude  Anxiety/OCD  Anxiety much better  On Anticoagulation;  No bleeding issues   Overall he is doing great. He should be at acceptable risk for his hernia repair.  Following repair reinitiation of anticoagulation will allow [place] to undertake cardioversion if his atrial fibrillation does not revert  spontaneously. We will arrange follow-up in the A. fib clinic about 3-4 weeks following his surgery and if he is out of rhythm we'll plan cardioversion

## 2017-01-02 NOTE — Patient Instructions (Signed)
Medication Instructions: Your physician recommends that you continue on your current medications as directed. Please refer to the Current Medication list given to you today.   Labwork: None Ordered  Procedures/Testing: None Ordered   Follow-Up: Your physician recommends that you schedule a follow-up appointment in with Roderic Palau in [facility] 3-4 weeks after your hernia surgery. Please call the office and let us know the date of your surgery.   Any Additional Special Instructions Will Be Listed Below (If Applicable).     If you need a refill on your cardiac medications before your next appointment, please call your pharmacy.

## 2017-01-31 NOTE — Patient Instructions (Addendum)
Malic Rosten [place]  01/31/2017   Your procedure is scheduled on: 02-09-17  Report to [facility]  Entrance take [facility]  elevators to 3rd floor to  [place] at 730  AM.  Call this number if you have problems the morning of surgery [phone]   Remember: ONLY 1 PERSON MAY GO WITH YOU TO SHORT STAY TO GET  READY MORNING OF [place].  Do not eat food or drink liquids :After Midnight.     Take these medicines the morning of surgery with A SIP OF WATER:  CARVEDILOL (COREG)                               You may not have any metal on your body including hair pins and              piercings  Do not wear jewelry, make-up, lotions, powders or perfumes, deodorant             Do not wear nail polish.  Do not shave  48 hours prior to surgery.              Men may shave face and neck.   Do not bring valuables to the hospital. [place].  Contacts, dentures or bridgework may not be worn into surgery.  Leave suitcase in the car. After surgery it may be brought to your room.     Patients discharged the day of surgery will not be allowed to drive home.  Name and phone number of your driver:WIFE Shella Spearing [phone]  Special Instructions: N/A              Please read over the following fact sheets you were given: _____________________________________________________________________  [facility] - Preparing for Surgery Before surgery, you can play an important role.  Because skin is not sterile, your skin needs to be as free of germs as possible.  You can reduce the number of germs on your skin by washing with CHG (chlorahexidine gluconate) soap before surgery.  CHG is an antiseptic cleaner which kills germs and bonds with the skin to continue killing germs even after washing. Please DO NOT use if you have an allergy to CHG or antibacterial soaps.  If your skin becomes reddened/irritated stop using the CHG and  inform your nurse when you arrive at Short Stay. Do not shave (including legs and underarms) for at least 48 hours prior to the first CHG shower.  You may shave your face/neck. Please follow these instructions carefully:  1.  Shower with CHG Soap the night before surgery and the  morning of Surgery.  2.  If you choose to wash your hair, wash your hair first as usual with your  normal  shampoo.  3.  After you shampoo, rinse your hair and body thoroughly to remove the  shampoo.                           4.  Use CHG as you would any other liquid soap.  You can apply chg directly  to the skin and wash  Gently with a scrungie or clean washcloth.  5.  Apply the CHG Soap to your body ONLY FROM THE NECK DOWN.   Do not use on face/ open                           Wound or open sores. Avoid contact with eyes, ears mouth and genitals (private parts).                       Wash face,  Genitals (private parts) with your normal soap.             6.  Wash thoroughly, paying special attention to the area where your surgery  will be performed.  7.  Thoroughly rinse your body with warm water from the neck down.  8.  DO NOT shower/wash with your normal soap after using and rinsing off  the CHG Soap.                9.  Pat yourself dry with a clean towel.            10.  Wear clean pajamas.            11.  Place clean sheets on your bed the night of your first shower and do not  sleep with pets. Day of Surgery : Do not apply any lotions/deodorants the morning of surgery.  Please wear clean clothes to the hospital/surgery center.  FAILURE TO FOLLOW THESE INSTRUCTIONS MAY RESULT IN THE CANCELLATION OF YOUR SURGERY PATIENT SIGNATURE_________________________________  NURSE SIGNATURE__________________________________  ________________________________________________________________________

## 2017-01-31 NOTE — Progress Notes (Addendum)
11-22-15 ECHO EPIC 01-02-17 EKG EPIC 11-21-15 CARDIAC CATH  CARDIAC CT 11-26-17 ICD ORDERS DR Caryl Comes ON CHART  LOV/CARDIAC CLEARANCE DR Caryl Comes 01-02-17 EPIC

## 2017-02-01 ENCOUNTER — Encounter (HOSPITAL_COMMUNITY)
Admission: RE | Admit: 2017-02-01 | Discharge: 2017-02-01 | Disposition: A | Discharge status: Home / Self Care | Payer: BLUE CROSS/BLUE SHIELD | Source: Ambulatory Visit | Attending: General Surgery | Admitting: General Surgery

## 2017-02-01 ENCOUNTER — Encounter (INDEPENDENT_AMBULATORY_CARE_PROVIDER_SITE_OTHER): Payer: Self-pay

## 2017-02-01 ENCOUNTER — Encounter (HOSPITAL_COMMUNITY): Payer: Self-pay

## 2017-02-01 DIAGNOSIS — Z01812 Encounter for preprocedural laboratory examination: Secondary | ICD-10-CM | POA: Insufficient documentation

## 2017-02-01 DIAGNOSIS — K4090 Unilateral inguinal hernia, without obstruction or gangrene, not specified as recurrent: Secondary | ICD-10-CM | POA: Diagnosis not present

## 2017-02-01 HISTORY — DX: Presence of automatic (implantable) cardiac defibrillator: Z95.810

## 2017-02-01 HISTORY — DX: Acute myocardial infarction, unspecified: I21.9

## 2017-02-01 HISTORY — DX: Unspecified osteoarthritis, unspecified site: M19.90

## 2017-02-01 LAB — CBC
HCT: 42.8 % (ref 39.0–52.0)
Hemoglobin: 14.4 g/dL (ref 13.0–17.0)
MCH: 30.6 pg (ref 26.0–34.0)
MCHC: 33.6 g/dL (ref 30.0–36.0)
MCV: 90.9 fL (ref 78.0–100.0)
Platelets: 237 10*3/uL (ref 150–400)
RBC: 4.71 MIL/uL (ref 4.22–5.81)
RDW: 13.4 % (ref 11.5–15.5)
WBC: 9.5 10*3/uL (ref 4.0–10.5)

## 2017-02-01 LAB — BASIC METABOLIC PANEL
Anion gap: 7 (ref 5–15)
BUN: 11 mg/dL (ref 6–20)
CO2: 27 mmol/L (ref 22–32)
Calcium: 9.9 mg/dL (ref 8.9–10.3)
Chloride: 107 mmol/L (ref 101–111)
Creatinine, Ser: 0.74 mg/dL (ref 0.61–1.24)
GFR calc Af Amer: >60 mL/min (ref >60)
GFR calc non Af Amer: >60 mL/min (ref >60)
Glucose, Bld: 90 mg/dL (ref 65–99)
Potassium: 4.7 mmol/L (ref 3.5–5.1)
Sodium: 141 mmol/L (ref 135–145)

## 2017-02-08 NOTE — Anesthesia Preprocedure Evaluation (Addendum)
Anesthesia Evaluation  Patient identified by MRN, date of birth, ID band Patient awake    Reviewed: Allergy & Precautions, NPO status , Patient's Chart, lab work & pertinent test results  History of Anesthesia Complications Negative for: history of anesthetic complications  Airway Mallampati: II  TM Distance: >3 FB Neck ROM: Full    Dental no notable dental hx. (+) Dental Advisory Given   Pulmonary neg pulmonary ROS, former smoker,    Pulmonary exam normal        Cardiovascular hypertension, Pt. on home beta blockers + angina + CAD and + Past MI  Normal cardiovascular exam+ Cardiac Defibrillator   Study Conclusions  - Left ventricle: The cavity size was normal. There was moderate   concentric hypertrophy. Systolic function was normal. The   estimated ejection fraction was in the range of 55% to 60%. Wall   motion was normal; there were no regional wall motion   abnormalities. - Aortic valve: Severe diffuse thickening and calcification   involving the left coronary and noncoronary cusp. - Mitral valve: Calcified annulus. There was trivial regurgitation.    Neuro/Psych  Headaches,    GI/Hepatic negative GI ROS, Neg liver ROS,   Endo/Other  negative endocrine ROS  Renal/GU negative Renal ROS     Musculoskeletal negative musculoskeletal ROS (+)   Abdominal   Peds  Hematology negative hematology ROS (+)   Anesthesia Other Findings Day of surgery medications reviewed with the patient.  Reproductive/Obstetrics                           Anesthesia Physical Anesthesia Plan  ASA: III  Anesthesia Plan: MAC and Spinal   Post-op Pain Management:  Regional for Post-op pain   Induction: Intravenous  Airway Management Planned: Natural Airway and Simple Face Mask  Additional Equipment:   Intra-op Plan:   Post-operative Plan: Extubation in OR  Informed Consent: I have reviewed the  patients History and Physical, chart, labs and discussed the procedure including the risks, benefits and alternatives for the proposed anesthesia with the patient or authorized representative who has indicated his/her understanding and acceptance.   Dental advisory given  Plan Discussed with: CRNA, Anesthesiologist and Surgeon  Anesthesia Plan Comments:        Anesthesia Quick Evaluation

## 2017-02-09 ENCOUNTER — Ambulatory Visit (HOSPITAL_COMMUNITY): Payer: BLUE CROSS/BLUE SHIELD | Admitting: Anesthesiology

## 2017-02-09 ENCOUNTER — Ambulatory Visit (HOSPITAL_COMMUNITY)
Admission: RE | Admit: 2017-02-09 | Discharge: 2017-02-09 | Disposition: A | Discharge status: Home / Self Care | Payer: BLUE CROSS/BLUE SHIELD | Source: Ambulatory Visit | Attending: General Surgery | Admitting: General Surgery

## 2017-02-09 ENCOUNTER — Encounter (HOSPITAL_COMMUNITY)
Admission: RE | Disposition: A | Discharge status: Home / Self Care | Payer: Self-pay | Source: Ambulatory Visit | Attending: General Surgery

## 2017-02-09 DIAGNOSIS — Z823 Family history of stroke: Secondary | ICD-10-CM | POA: Insufficient documentation

## 2017-02-09 DIAGNOSIS — G8918 Other acute postprocedural pain: Secondary | ICD-10-CM | POA: Diagnosis not present

## 2017-02-09 DIAGNOSIS — Z803 Family history of malignant neoplasm of breast: Secondary | ICD-10-CM | POA: Diagnosis not present

## 2017-02-09 DIAGNOSIS — I10 Essential (primary) hypertension: Secondary | ICD-10-CM | POA: Insufficient documentation

## 2017-02-09 DIAGNOSIS — Z836 Family history of other diseases of the respiratory system: Secondary | ICD-10-CM | POA: Diagnosis not present

## 2017-02-09 DIAGNOSIS — Z7901 Long term (current) use of anticoagulants: Secondary | ICD-10-CM | POA: Insufficient documentation

## 2017-02-09 DIAGNOSIS — Z8674 Personal history of sudden cardiac arrest: Secondary | ICD-10-CM | POA: Insufficient documentation

## 2017-02-09 DIAGNOSIS — Z8249 Family history of ischemic heart disease and other diseases of the circulatory system: Secondary | ICD-10-CM | POA: Insufficient documentation

## 2017-02-09 DIAGNOSIS — J309 Allergic rhinitis, unspecified: Secondary | ICD-10-CM | POA: Diagnosis not present

## 2017-02-09 DIAGNOSIS — Z833 Family history of diabetes mellitus: Secondary | ICD-10-CM | POA: Insufficient documentation

## 2017-02-09 DIAGNOSIS — Z79899 Other long term (current) drug therapy: Secondary | ICD-10-CM | POA: Diagnosis not present

## 2017-02-09 DIAGNOSIS — Z8261 Family history of arthritis: Secondary | ICD-10-CM | POA: Insufficient documentation

## 2017-02-09 DIAGNOSIS — K4090 Unilateral inguinal hernia, without obstruction or gangrene, not specified as recurrent: Secondary | ICD-10-CM | POA: Insufficient documentation

## 2017-02-09 DIAGNOSIS — Z95810 Presence of automatic (implantable) cardiac defibrillator: Secondary | ICD-10-CM | POA: Diagnosis not present

## 2017-02-09 DIAGNOSIS — Z8349 Family history of other endocrine, nutritional and metabolic diseases: Secondary | ICD-10-CM | POA: Diagnosis not present

## 2017-02-09 DIAGNOSIS — Z885 Allergy status to narcotic agent status: Secondary | ICD-10-CM | POA: Insufficient documentation

## 2017-02-09 DIAGNOSIS — I2510 Atherosclerotic heart disease of native coronary artery without angina pectoris: Secondary | ICD-10-CM | POA: Insufficient documentation

## 2017-02-09 DIAGNOSIS — Z87891 Personal history of nicotine dependence: Secondary | ICD-10-CM | POA: Diagnosis not present

## 2017-02-09 DIAGNOSIS — K649 Unspecified hemorrhoids: Secondary | ICD-10-CM | POA: Diagnosis not present

## 2017-02-09 DIAGNOSIS — M545 Low back pain: Secondary | ICD-10-CM | POA: Diagnosis not present

## 2017-02-09 HISTORY — PX: INSERTION OF MESH: SHX5868

## 2017-02-09 HISTORY — PX: INGUINAL HERNIA REPAIR: SHX194

## 2017-02-09 LAB — PROTIME-INR
INR: 0.99
Prothrombin Time: 13.1 seconds (ref 11.4–15.2)

## 2017-02-09 SURGERY — REPAIR, HERNIA, INGUINAL, ADULT
Anesthesia: General | Laterality: Left

## 2017-02-09 MED ORDER — PHENYLEPHRINE HCL 10 MG/ML IJ SOLN
INTRAMUSCULAR | Status: DC | PRN
  Administered 2017-02-09 (×2): 40 ug via INTRAVENOUS

## 2017-02-09 MED ORDER — ROCURONIUM BROMIDE 50 MG/5ML IV SOSY
PREFILLED_SYRINGE | INTRAVENOUS | Status: AC
  Filled 2017-02-09: qty 5

## 2017-02-09 MED ORDER — MORPHINE SULFATE (PF) 10 MG/ML IV SOLN: 1.0000 mg | INTRAVENOUS | Status: DC | PRN

## 2017-02-09 MED ORDER — ONDANSETRON HCL 4 MG/2ML IJ SOLN
INTRAMUSCULAR | Status: DC | PRN
  Administered 2017-02-09: 4 mg via INTRAVENOUS

## 2017-02-09 MED ORDER — PROPOFOL 10 MG/ML IV BOLUS
INTRAVENOUS | Status: AC
  Filled 2017-02-09: qty 20

## 2017-02-09 MED ORDER — DEXAMETHASONE SODIUM PHOSPHATE 10 MG/ML IJ SOLN
INTRAMUSCULAR | Status: AC
  Filled 2017-02-09: qty 1

## 2017-02-09 MED ORDER — SODIUM CHLORIDE 0.9% FLUSH: 3.0000 mL | Freq: Two times a day (BID) | INTRAVENOUS | Status: DC

## 2017-02-09 MED ORDER — BUPIVACAINE-EPINEPHRINE 0.25% -1:200000 IJ SOLN
INTRAMUSCULAR | Status: DC | PRN
  Administered 2017-02-09: 3 mL

## 2017-02-09 MED ORDER — PROPOFOL 500 MG/50ML IV EMUL
INTRAVENOUS | Status: DC | PRN
  Administered 2017-02-09: 50 ug/kg/min via INTRAVENOUS

## 2017-02-09 MED ORDER — GABAPENTIN 300 MG PO CAPS
300.0000 mg | ORAL_CAPSULE | ORAL | Status: AC
  Administered 2017-02-09: 300 mg via ORAL
  Filled 2017-02-09: qty 1

## 2017-02-09 MED ORDER — ACETAMINOPHEN 500 MG PO TABS
1000.0000 mg | ORAL_TABLET | ORAL | Status: AC
  Administered 2017-02-09: 1000 mg via ORAL
  Filled 2017-02-09: qty 2

## 2017-02-09 MED ORDER — MIDAZOLAM HCL 5 MG/ML IJ SOLN
2.0000 mg | Freq: Once | INTRAMUSCULAR | Status: AC
  Administered 2017-02-09: 2 mg via INTRAVENOUS

## 2017-02-09 MED ORDER — OXYCODONE HCL 5 MG PO TABS
5.0000 mg | ORAL_TABLET | ORAL | Status: DC | PRN
  Administered 2017-02-09: 5 mg via ORAL
  Filled 2017-02-09: qty 1

## 2017-02-09 MED ORDER — FENTANYL CITRATE (PF) 100 MCG/2ML IJ SOLN
INTRAMUSCULAR | Status: AC
  Filled 2017-02-09: qty 2

## 2017-02-09 MED ORDER — MIDAZOLAM HCL 5 MG/5ML IJ SOLN
INTRAMUSCULAR | Status: DC | PRN
  Administered 2017-02-09 (×2): 1 mg via INTRAVENOUS

## 2017-02-09 MED ORDER — ACETAMINOPHEN 650 MG RE SUPP
650.0000 mg | RECTAL | Status: DC | PRN
  Filled 2017-02-09: qty 1

## 2017-02-09 MED ORDER — 0.9 % SODIUM CHLORIDE (POUR BTL) OPTIME
TOPICAL | Status: DC | PRN
  Administered 2017-02-09: 1000 mL

## 2017-02-09 MED ORDER — PHENYLEPHRINE HCL 10 MG/ML IJ SOLN: 30.0000 ug/min | INTRAVENOUS | Status: DC

## 2017-02-09 MED ORDER — ROPIVACAINE HCL 5 MG/ML IJ SOLN
INTRAMUSCULAR | Status: AC
Start: 2017-02-09 — End: ?
  Filled 2017-02-09: qty 30

## 2017-02-09 MED ORDER — ACETAMINOPHEN 325 MG PO TABS: 650.0000 mg | ORAL_TABLET | ORAL | Status: DC | PRN

## 2017-02-09 MED ORDER — ROPIVACAINE HCL 5 MG/ML IJ SOLN
INTRAMUSCULAR | Status: DC | PRN
  Administered 2017-02-09: 150 mg

## 2017-02-09 MED ORDER — BUPIVACAINE-EPINEPHRINE 0.25% -1:200000 IJ SOLN
INTRAMUSCULAR | Status: AC
  Filled 2017-02-09: qty 1

## 2017-02-09 MED ORDER — SODIUM CHLORIDE 0.9 % IV SOLN: 250.0000 mL | INTRAVENOUS | Status: DC | PRN

## 2017-02-09 MED ORDER — MIDAZOLAM HCL 2 MG/2ML IJ SOLN
INTRAMUSCULAR | Status: AC
  Filled 2017-02-09: qty 2

## 2017-02-09 MED ORDER — HYDROMORPHONE HCL 1 MG/ML IJ SOLN
0.2500 mg | INTRAMUSCULAR | Status: DC | PRN
  Administered 2017-02-09: 0.5 mg via INTRAVENOUS

## 2017-02-09 MED ORDER — BUPIVACAINE IN DEXTROSE 0.75-8.25 % IT SOLN
INTRATHECAL | Status: DC | PRN
  Administered 2017-02-09: 15 mg via INTRATHECAL

## 2017-02-09 MED ORDER — OXYCODONE HCL 5 MG PO TABS: 5.0000 mg | ORAL_TABLET | ORAL | 0 refills | Status: DC | PRN

## 2017-02-09 MED ORDER — CHLORHEXIDINE GLUCONATE CLOTH 2 % EX PADS: 6.0000 | MEDICATED_PAD | Freq: Once | CUTANEOUS | Status: DC

## 2017-02-09 MED ORDER — LIDOCAINE 2% (20 MG/ML) 5 ML SYRINGE
INTRAMUSCULAR | Status: AC
  Filled 2017-02-09: qty 5

## 2017-02-09 MED ORDER — SODIUM CHLORIDE 0.9% FLUSH: 3.0000 mL | INTRAVENOUS | Status: DC | PRN

## 2017-02-09 MED ORDER — LACTATED RINGERS IV SOLN
INTRAVENOUS | Status: DC
  Administered 2017-02-09: 1000 mL via INTRAVENOUS
  Administered 2017-02-09: 10:00:00 via INTRAVENOUS

## 2017-02-09 MED ORDER — SCOPOLAMINE 1 MG/3DAYS TD PT72
1.0000 | MEDICATED_PATCH | TRANSDERMAL | Status: DC
  Administered 2017-02-09: 1.5 mg via TRANSDERMAL
  Filled 2017-02-09: qty 1

## 2017-02-09 MED ORDER — HYDROMORPHONE HCL 1 MG/ML IJ SOLN
INTRAMUSCULAR | Status: AC
  Administered 2017-02-09: 0.5 mg via INTRAVENOUS
  Filled 2017-02-09: qty 1

## 2017-02-09 MED ORDER — CEFAZOLIN SODIUM-DEXTROSE 2-4 GM/100ML-% IV SOLN
2.0000 g | INTRAVENOUS | Status: AC
  Administered 2017-02-09: 2 g via INTRAVENOUS
  Filled 2017-02-09: qty 100

## 2017-02-09 MED ORDER — ONDANSETRON HCL 4 MG/2ML IJ SOLN
INTRAMUSCULAR | Status: AC
  Filled 2017-02-09: qty 2

## 2017-02-09 MED ORDER — PHENYLEPHRINE HCL 10 MG/ML IJ SOLN
INTRAMUSCULAR | Status: AC
  Filled 2017-02-09: qty 1

## 2017-02-09 MED ORDER — FENTANYL CITRATE (PF) 100 MCG/2ML IJ SOLN
100.0000 ug | Freq: Once | INTRAMUSCULAR | Status: AC
  Administered 2017-02-09: 100 ug via INTRAVENOUS

## 2017-02-09 MED ORDER — PHENYLEPHRINE HCL 10 MG/ML IJ SOLN
INTRAMUSCULAR | Status: DC | PRN
  Administered 2017-02-09: 10 ug/min via INTRAVENOUS

## 2017-02-09 MED ORDER — PROMETHAZINE HCL 25 MG/ML IJ SOLN: 6.2500 mg | INTRAMUSCULAR | Status: DC | PRN

## 2017-02-09 SURGICAL SUPPLY — 25 items
BENZOIN TINCTURE PRP APPL 2/3 (GAUZE/BANDAGES/DRESSINGS) ×2 IMPLANT
COVER SURGICAL LIGHT HANDLE (MISCELLANEOUS) IMPLANT
DECANTER SPIKE VIAL GLASS SM (MISCELLANEOUS) ×2 IMPLANT
DRAIN PENROSE 18X1/2 LTX STRL (DRAIN) ×2 IMPLANT
DRAPE LAPAROTOMY T 98X78 PEDS (DRAPES) ×2 IMPLANT
DRSG TEGADERM 4X4.75 (GAUZE/BANDAGES/DRESSINGS) IMPLANT
ELECT REM PT RETURN 9FT ADLT (ELECTROSURGICAL) ×2
ELECTRODE REM PT RTRN 9FT ADLT (ELECTROSURGICAL) ×1 IMPLANT
GAUZE SPONGE 4X4 12PLY STRL (GAUZE/BANDAGES/DRESSINGS) ×2 IMPLANT
GLOVE BIO SURGEON STRL SZ7.5 (GLOVE) ×2 IMPLANT
GLOVE INDICATOR 8.0 STRL GRN (GLOVE) ×2 IMPLANT
GOWN STRL REUS W/TWL XL LVL3 (GOWN DISPOSABLE) ×4 IMPLANT
KIT BASIN OR (CUSTOM PROCEDURE TRAY) ×2 IMPLANT
MESH ULTRAPRO 3X6 7.6X15CM (Mesh General) ×2 IMPLANT
NEEDLE HYPO 22GX1.5 SAFETY (NEEDLE) ×2 IMPLANT
PACK GENERAL/GYN (CUSTOM PROCEDURE TRAY) ×2 IMPLANT
STRIP CLOSURE SKIN 1/2X4 (GAUZE/BANDAGES/DRESSINGS) ×2 IMPLANT
SUT MNCRL AB 4-0 PS2 18 (SUTURE) ×2 IMPLANT
SUT PROLENE 2 0 CT2 30 (SUTURE) ×4 IMPLANT
SUT VIC AB 2-0 SH 27 (SUTURE) ×1
SUT VIC AB 2-0 SH 27X BRD (SUTURE) ×1 IMPLANT
SUT VIC AB 3-0 SH 18 (SUTURE) ×2 IMPLANT
SYR 20CC LL (SYRINGE) ×2 IMPLANT
TOWEL OR 17X26 10 PK STRL BLUE (TOWEL DISPOSABLE) ×2 IMPLANT
TOWEL OR NON WOVEN STRL DISP B (DISPOSABLE) ×2 IMPLANT

## 2017-02-09 NOTE — Op Note (Signed)
Daniel Richard 02/05/1956 [id number] 02/09/2017   Open Left Indirect Inguinal Hernia Repair with Mesh Procedure Note  Indications: The patient presented with a history of a left, reducible hernia.  Because of his significant cardiac history, pt requested to avoid general anesthesia so spinal was used and an assistant was used to help decrease amount of time in OR.   Pre-operative Diagnosis: left reducible  Post-operative Diagnosis: left indirect inguinal hernia  Surgeon: Gayland Curry   Assistants: Romana Juniper MD  Anesthesia: Spinal anesthesia and L TAPP block  Procedure Details  The patient was seen again in the Holding Room. The risks, benefits, complications, treatment options, and expected outcomes were discussed with the patient. The possibilities of reaction to medication, pulmonary aspiration, perforation of viscus, bleeding, recurrent infection, the need for additional procedures, and development of a complication requiring transfusion or further operation were discussed with the patient and/or family. The likelihood of success in repairing the hernia and returning the patient to their previous functional status is good.  There was concurrence with the proposed plan, and informed consent was obtained. The site of surgery was properly noted/marked. He received a TAP block in holding.  The patient was taken to the Operating Room, identified as AZTLAN COLL, and the procedure verified as left inguinal hernia repair. A Time Out was held and the above information confirmed.  After spinal anesthesia, The patient was placed in the supine position and underwent induction of conscious sedation. The lower abdomen and groin was prepped with Chloraprep and draped in the standard fashion, and 0.25% Marcaine with epinephrine was used to anesthetize the skin over the mid-portion of the inguinal canal. An oblique incision was made. Dissection was carried down through the subcutaneous tissue  with cautery to the external oblique fascia.  He had a pre-existing tear in his external oblique. We opened the external oblique fascia along the direction of its fibers to the external ring.  The spermatic cord was circumferentially dissected bluntly and retracted with a Penrose drain.  The floor of the inguinal canal was inspected and there was no direct defect.  We skeletonized the spermatic cord and isolated the hernia sac. A cord lipoma was removed and discarded.  We used a 3 x 6 inch piece of Ultrapro mesh, which was cut into a keyhole shape.  This was secured with 2-0 Prolene, beginning at the pubic tubercle, running this along the shelving edge inferiorly. Superiorly, the mesh was secured to the internal oblique fascia with interrupted 2-0 Prolene sutures.  The tails of the mesh were sutured together behind the spermatic cord.  The mesh was tucked underneath the external oblique fascia laterally.  The external oblique fascia was reapproximated with 2-0 Vicryl.  3-0 Vicryl was used to close the subcutaneous tissues and 4-0 Monocryl was used to close the skin in subcuticular fashion.  Benzoin and steri-strips were used to seal the incision.  A clean dressing was applied.  The patient was then extubated and brought to the recovery room in stable condition.  All sponge, instrument, and needle counts were correct prior to closure and at the conclusion of the case.   Estimated Blood Loss: Minimal                 Complications: None; patient tolerated the procedure well.         Disposition: PACU - hemodynamically stable.         Condition: stable  [name]. Redmond Pulling, MD, FACS General, Bariatric, & Minimally  Invasive Surgery [facility], [place]

## 2017-02-09 NOTE — Discharge Instructions (Signed)
RESUME ELIQUIS (BLOOD THINNER) ON Monday   [facility]  UMBILICAL OR INGUINAL HERNIA REPAIR: POST OP INSTRUCTIONS  Always review your discharge instruction sheet given to you by the facility where your surgery was performed. IF YOU HAVE DISABILITY OR FAMILY LEAVE FORMS, YOU MUST BRING THEM TO THE OFFICE FOR PROCESSING.   DO NOT GIVE THEM TO YOUR DOCTOR.  1. A  prescription for pain medication may be given to you upon discharge.  Take your pain medication as prescribed, if needed.  If narcotic pain medicine is not needed, then you may take acetaminophen (Tylenol) or ibuprofen (Advil) as needed. 2. Take your usually prescribed medications unless otherwise directed. 3. If you need a refill on your pain medication, please contact your pharmacy.  They will contact our office to request authorization. Prescriptions will not be filled after 5 pm or on week-ends. 4. You should follow a light diet the first 24 hours after arrival home, such as soup and crackers, etc.  Be sure to include lots of fluids daily.  Resume your normal diet the day after surgery. 5. Most patients will experience some swelling and bruising around the umbilicus or in the groin and scrotum.  Ice packs and reclining will help.  Swelling and bruising can take several days to resolve.  6. It is common to experience some constipation if taking pain medication after surgery.  Increasing fluid intake and taking a stool softener (such as Colace) will usually help or prevent this problem from occurring.  A mild laxative (Milk of Magnesia or Miralax) should be taken according to package directions if there are no bowel movements after 48 hours. 7. Unless discharge instructions indicate otherwise, you may remove your bandages 48 hours after surgery, and you may shower at that time.  You  have steri-strips (small skin tapes) in place directly over the incision.  These strips should be left on the skin for 7-10 days.   8. ACTIVITIES:  You may resume regular (light) daily activities beginning the next day--such as daily self-care, walking, climbing stairs--gradually increasing activities as tolerated.  You may have sexual intercourse when it is comfortable.  Refrain from any heavy lifting or straining until approved by your doctor. a. You may drive when you are no longer taking prescription pain medication, you can comfortably wear a seatbelt, and you can safely maneuver your car and apply brakes. b. RETURN TO WORK:  9. You should see your doctor in the office for a follow-up appointment approximately 2-3 weeks after your surgery.  Make sure that you call for this appointment within a day or two after you arrive home to insure a convenient appointment time. 10. OTHER INSTRUCTIONS: DO NOT LIFT, PUSH, OR PULL ANYTHING GREATER THAN 15 POUNDS FOR 6 WEEKS    WHEN TO CALL YOUR DOCTOR: 1. Fever over 101.0 2. Inability to urinate 3. Nausea and/or vomiting 4. Extreme swelling or bruising 5. Continued bleeding from incision. 6. Increased pain, redness, or drainage from the incision  The clinic staff is available to answer your questions during regular business hours.  Please dont hesitate to call and ask to speak to one of the nurses for clinical concerns.  If you have a medical emergency, go to the nearest emergency room or call 911.  A surgeon from [facility] is always on call at the hospital   [address] [place], [place], [place]  [address] ?  [address], [place], [place]   [address] (  [phone] ? [phone] ? FAX [phone] Web site: [email or web address]    Regional Anesthesia, Care After Refer to this sheet in the next few weeks. These instructions provide you with information about caring for yourself after your procedure. Your health care provider may also give you more specific instructions. Your treatment has been planned according to current medical practices,  but problems sometimes occur. Call your health care provider if you have any problems or questions after your procedure. What can I expect after the procedure? After the procedure, it is common to have:  Sleepiness.  Nausea.  Itching. Follow these instructions at home:  Take over-the-counter and prescription medicines only as told by your health care provider.  Do not drive, exercise, or do any other activities that require coordination for 24 hours or as told by your health care provider. Ask your health care provider when you can return to your usual activities.  Drink enough water to keep your urine clear or pale yellow.  If you had a bandage (dressing) placed over the injection site, only remove it when told to do so by your health care provider. Contact a health care provider if:  You continue to have nausea and vomiting for more than one day.  You develop a rash.  You have trouble urinating Get help right away if:  You have bleeding from the injection site or bleeding under the skin at the injection site.  You have redness, swelling, or pain around your injection site.  You have a fever.  You develop a headache.  You develop new numbness or weakness. This information is not intended to replace advice given to you by your health care provider. Make sure you discuss any questions you have with your health care provider. Document Released: 03/20/2016 Document Revised: 05/04/2016 Document Reviewed: 03/24/2015 Elsevier Interactive Patient Education  2017 [place].    Spinal Anesthesia and Epidural Anesthesia, Care After These instructions provide you with information about caring for yourself after your procedure. Your health care provider may also give you more specific instructions. Your treatment has been planned according to current medical practices, but problems sometimes occur. Call your health care provider if you have any problems or questions after your  procedure. What can I expect after the procedure? After the procedure, it is common to have:  Sleepiness.  Nausea.  Vomiting.  Numbness or tingling in your legs.  Trouble urinating.  Itching. Follow these instructions at home: For at least 24 hours after the procedure:    Do not:  Participate in activities in which you could fall or become injured.  Drive.  Use heavy machinery.  Drink alcohol.  Take sleeping pills or medicines that cause drowsiness.  Make important decisions or sign legal documents.  Take care of children on your own.  Rest. Eating and drinking   If you vomit, drink water, juice, or soup when you can drink without vomiting.  Make sure you have little or no nausea before eating solid foods.  Follow the diet that is recommended by your health care provider. General instructions   Have a responsible adult stay with you until you are awake and alert.  Take over-the-counter and prescription medicines only as told by your health care provider.  If you smoke, do not smoke without supervision.  Keep all follow-up visits as told by your health care provider. This is important. Contact a health care provider if:  You have nausea and vomiting that continue the day after anesthetic  medicine was given.  You develop a rash. Get help right away if:  You have a fever.  You have a persistent or severe headache.  You develop blurred or double vision.  You develop dizziness or feel light-headed.  You faint.  You have weakness, numbness, or tingling in your arms or legs.  You have difficulty breathing.  You are unable to pass urine. This information is not intended to replace advice given to you by your health care provider. Make sure you discuss any questions you have with your health care provider. Document Released: 02/17/2004 Document Revised: 05/05/2016 Document Reviewed: 03/20/2016 Elsevier Interactive Patient Education  2017 [employer].

## 2017-02-09 NOTE — Progress Notes (Signed)
Assisted Dr. Tobias Alexander with left, ultrasound guided, transabdominal plane block. Side rails up, monitors on throughout procedure. See vital signs in flow sheet. Tolerated Procedure well.

## 2017-02-09 NOTE — Anesthesia Procedure Notes (Signed)
Spinal  Patient location during procedure: OR Start time: 02/09/2017 9:40 AM End time: 02/09/2017 9:43 AM Staffing Anesthesiologist: Duane Boston Resident/CRNA: Glory Buff Performed: resident/CRNA  Preanesthetic Checklist Completed: patient identified, site marked, surgical consent, pre-op evaluation, timeout performed, IV checked, risks and benefits discussed and monitors and equipment checked Spinal Block Patient position: sitting Prep: DuraPrep Patient monitoring: heart rate, continuous pulse ox and blood pressure Approach: midline Location: L2-3 Injection technique: single-shot Needle Needle type: Pencan  Needle gauge: 24 G Needle length: 10 cm Needle insertion depth: 6 cm Assessment Sensory level: T6 Additional Notes Kit expiration date checked.  +CSF, -heme,-paraesthesia, patient tolerated well.

## 2017-02-09 NOTE — Progress Notes (Signed)
Patient was able to stand at bedside with walker. Does not want to ambulate to bathroom at this time.

## 2017-02-09 NOTE — Interval H&P Note (Signed)
History and Physical Interval Note:  02/09/2017 9:25 AM  Daniel Richard  has presented today for surgery, with the diagnosis of left inguinal hernia  The various methods of treatment have been discussed with the patient and family. After consideration of risks, benefits and other options for treatment, the patient has consented to  Procedure(s): OPEN REPAIR LEFT INGUINAL HERNIA  (Left) INSERTION OF MESH (Left) as a surgical intervention .  The patient's history has been reviewed, patient examined, no change in status, stable for surgery.  I have reviewed the patient's chart and labs.  Questions were answered to the patient's satisfaction.    [name]. Redmond Pulling, MD, [place], Bariatric, & Minimally Invasive Surgery [facility], [place]   [facility]

## 2017-02-09 NOTE — Anesthesia Postprocedure Evaluation (Signed)
Anesthesia Post Note  Patient: Daniel Richard  Procedure(s) Performed: Procedure(s) (LRB): OPEN REPAIR LEFT INGUINAL HERNIA  (Left) INSERTION OF MESH (Left)  Patient location during evaluation: PACU Anesthesia Type: Spinal Level of consciousness: awake and alert Pain management: pain level controlled Vital Signs Assessment: post-procedure vital signs reviewed and stable Respiratory status: spontaneous breathing and respiratory function stable Cardiovascular status: blood pressure returned to baseline and stable Postop Assessment: spinal receding Anesthetic complications: no       Last Vitals:  Vitals:   02/09/17 1230 02/09/17 1246  BP: 117/82 128/87  Pulse: 82 68  Resp: 13 16  Temp: 36.3 C 36.5 C    Last Pain:  Vitals:   02/09/17 1248  TempSrc:   PainSc: 3                  Secilia Apps DANIEL

## 2017-02-09 NOTE — H&P (Signed)
Daniel Richard 12/22/2016 10:21 AM Location: [facility] Patient #: [id number] DOB: 31-Aug-1956 Married / Language: English / Race: White Male   History of Present Illness Daniel Hiss M. Ryver Zadrozny MD; 12/22/2016 11:05 AM) Patient words: hernia.  The patient is a 61 year old male who presents with an inguinal hernia. He is referred by Dr. Jenny Richard for evaluation of a left inguinal hernia. He had noticed some occasional ache in his left groin mainly toward the end of the work day. However he had an upper respiratory issue in December with lots of coughing and it bothered him a little bit more frequently. He was found to have a left inguinal hernia. He describes the discomfort as an ache or pull sensation. He denies any burning, shooting, or stabbing pain. He has a history of sudden cardiac arrest. He has an ICD. He denies that it is fired since insertion. He is on an oral blood thinner for paroxysmal atrial fibrillation. He denies tobacco, chest pain, chest pressure, source of breath, TIAs or amaurosis fugax.   Problem List/Past Medical Daniel Hiss M. Redmond Pulling, MD; 12/22/2016 11:05 AM) H/O CARDIAC ARREST (Z86.74)  ICD (IMPLANTABLE CARDIOVERTER-DEFIBRILLATOR) IN PLACE (Z95.810)  LEFT INGUINAL HERNIA (K40.90)  ANTICOAGULATED (Z79.01)   Past Surgical History (Daniel Eversole, LPN; 3/38/2505 39:76 AM) No pertinent past surgical history   Diagnostic Studies History (Daniel Eversole, LPN; 7/34/1937 90:24 AM) Colonoscopy  never  Allergies (Daniel Eversole, LPN; 0/97/3532 99:24 AM) Demerol *ANALGESICS - OPIOID*   Medication History (Daniel Eversole, LPN; 2/68/3419 62:22 AM) AmLODIPine Besylate (2.'5MG'$  Tablet, Oral) Active. Amoxicillin ('500MG'$  Capsule, Oral) Active. Carvedilol ('25MG'$  Tablet, Oral) Active. Eliquis ('5MG'$  Tablet, Oral) Active. Rosuvastatin Calcium ('40MG'$  Tablet, Oral) Active. Medications Reconciled  Social History (Daniel Eversole, LPN; 9/79/8921 19:41 AM) Alcohol use   Recently quit alcohol use. Caffeine use  Coffee. No drug use  Tobacco use  Former smoker.  Family History Aleatha Borer, LPN; 7/40/8144 81:85 AM) Arthritis  Brother, Father, Mother, Son. Breast Cancer  Mother. Cerebrovascular Accident  Mother. Diabetes Mellitus  Mother. Heart Disease  Brother, Father. Heart disease in male family member before age 77  Heart disease in male family member before age 35  Hypertension  Brother, Father, Mother. Respiratory Condition  Brother. Thyroid problems  Mother.  Other Problems Daniel Hiss M. Redmond Pulling, MD; 12/22/2016 11:05 AM) Inguinal Hernia  Myocardial infarction     Review of Systems (Daniel Eversole LPN; 6/31/4970 26:37 AM) General Not Present- Appetite Loss, Chills, Fatigue, Fever, Night Sweats, Weight Gain and Weight Loss. Skin Not Present- Change in Wart/Mole, Dryness, Hives, Jaundice, New Lesions, Non-Healing Wounds, Rash and Ulcer. HEENT Present- Ringing in the Ears and Wears glasses/contact lenses. Not Present- Earache, Hearing Loss, Hoarseness, Nose Bleed, Oral Ulcers, Seasonal Allergies, Sinus Pain, Sore Throat, Visual Disturbances and Yellow Eyes. Breast Not Present- Breast Mass, Breast Pain, Nipple Discharge and Skin Changes. Cardiovascular Not Present- Chest Pain, Difficulty Breathing Lying Down, Leg Cramps, Palpitations, Rapid Heart Rate, Shortness of Breath and Swelling of Extremities. Gastrointestinal Present- Hemorrhoids. Not Present- Abdominal Pain, Bloating, Bloody Stool, Change in Bowel Habits, Chronic diarrhea, Constipation, Difficulty Swallowing, Excessive gas, Gets full quickly at meals, Indigestion, Nausea, Rectal Pain and Vomiting. Male Genitourinary Not Present- Blood in Urine, Change in Urinary Stream, Frequency, Impotence, Nocturia, Painful Urination, Urgency and Urine Leakage. Musculoskeletal Not Present- Back Pain, Joint Pain, Joint Stiffness, Muscle Pain, Muscle Weakness and Swelling of  Extremities. Neurological Not Present- Decreased Memory, Fainting, Headaches, Numbness, Seizures, Tingling, Tremor, Trouble walking and Weakness. Psychiatric Not Present- Anxiety, Bipolar,  Change in Sleep Pattern, Depression, Fearful and Frequent crying. Endocrine Not Present- Cold Intolerance, Excessive Hunger, Hair Changes, Heat Intolerance, Hot flashes and New Diabetes. Hematology Present- Blood Thinners. Not Present- Easy Bruising, Excessive bleeding, Gland problems, HIV and Persistent Infections.  Vitals (Daniel Eversole LPN; 1/61/0960 45:40 AM) 12/22/2016 10:21 AM Weight: 187.6 lb Height: 72in Body Surface Area: 2.07 m Body Mass Index: 25.44 kg/m  Temp.: 98.29F(Oral)  Pulse: 86 (Regular)  BP: 124/78 (Sitting, Left Arm, Standard)       Physical Exam Daniel Hiss M. Shulem Mader MD; 12/22/2016 11:04 AM) General Mental Status-Alert. General Appearance-Consistent with stated age. Hydration-Well hydrated. Voice-Normal.  Head and Neck Head-normocephalic, atraumatic with no lesions or palpable masses. Trachea-midline. Thyroid Gland Characteristics - normal size and consistency.  Eye Eyeball - Bilateral-Extraocular movements intact. Sclera/Conjunctiva - Bilateral-No scleral icterus.  Chest and Lung Exam Chest and lung exam reveals -quiet, even and easy respiratory effort with no use of accessory muscles and on auscultation, normal breath sounds, no adventitious sounds and normal vocal resonance. Inspection Chest Wall - Normal. Back - normal.  Breast - Did not examine.  Cardiovascular Cardiovascular examination reveals -normal heart sounds, regular rate and rhythm with no murmurs and normal pedal pulses bilaterally.  Abdomen Inspection Inspection of the abdomen reveals - No Hernias. Skin - Scar - no surgical scars. Palpation/Percussion Palpation and Percussion of the abdomen reveal - Soft, Non Tender, No Rebound tenderness, No Rigidity (guarding) and  No hepatosplenomegaly. Auscultation Auscultation of the abdomen reveals - Bowel sounds normal.  Male Genitourinary Note: Both testicles down. No scrotal testicular masses. No bulge or impulse with Valsalva in right groin. Easily reducible left inguinal hernia   Peripheral Vascular Upper Extremity Palpation - Pulses bilaterally normal.  Neurologic Neurologic evaluation reveals -alert and oriented x 3 with no impairment of recent or remote memory. Mental Status-Normal.  Neuropsychiatric The patient's mood and affect are described as -normal. Judgment and Insight-insight is appropriate concerning matters relevant to self.  Musculoskeletal Normal Exam - Left-Upper Extremity Strength Normal and Lower Extremity Strength Normal. Normal Exam - Right-Upper Extremity Strength Normal and Lower Extremity Strength Normal.  Lymphatic Head & Neck  General Head & Neck Lymphatics: Bilateral - Description - Normal. Axillary - Did not examine. Femoral & Inguinal - Did not examine.    Assessment & Plan Daniel Hiss M. Tomorrow Dehaas MD; 12/22/2016 11:03 AM) LEFT INGUINAL HERNIA (K40.90) Impression: We discussed the etiology of inguinal hernias. We discussed the signs & symptoms of incarceration & strangulation. We discussed non-operative and operative management. recommended an open approach given anticoagulation status  The patient has elected to proceed with open repair of Left inguinal hernia with mesh   I described the procedure in detail. The patient was given educational material. We discussed the risks and benefits including but not limited to bleeding, infection, chronic inguinal pain, nerve entrapment, hernia recurrence, mesh complications, hematoma formation, urinary retention, injury to the testicle, numbness in the groin, blood clots, injury to the surrounding structures, and anesthesia risk. We also discussed the typical post operative recovery course, including no heavy lifting for 4-6  weeks. I explained that the likelihood of improvement of their symptoms is good  explained that he is at higher risk for hematoma/bleeding given blood thinner. Current Plans I recommended obtaining preoperative cardiac clearance. I am concerned about the health of the patient and the ability to tolerate the operation. Therefore, we will request clearance by cardiology to better assess operative risk & see if a reevaluation, further workup, etc is needed.  Also recommendations on how medications such as for anticoagulation and blood pressure should be managed/held/restarted after surgery. Pt Education - Pamphlet Given - Hernia Surgery: discussed with patient and provided information. H/O CARDIAC ARREST (Z86.74) Impression: will need cardiac clearance ICD (IMPLANTABLE CARDIOVERTER-DEFIBRILLATOR) IN PLACE (Z95.810) ANTICOAGULATED (Z79.01) Impression: will need permission to hold blood thinner perioperatively  [name]. Redmond Pulling, MD, FACS General, Bariatric, & Minimally Invasive Surgery [facility], [place]

## 2017-02-09 NOTE — Anesthesia Procedure Notes (Signed)
Anesthesia Regional Block: TAP block   Pre-Anesthetic Checklist: ,, timeout performed, Correct Patient, Correct Site, Correct Laterality, Correct Procedure, Correct Position, site marked, Risks and benefits discussed,  Surgical consent,  Pre-op evaluation,  At surgeon's request and post-op pain management  Laterality: Left  Prep: chloraprep       Needles:  Injection technique: Single-shot  Needle Type: Echogenic Stimulator Needle     Needle Length: 10cm  Needle Gauge: 21     Additional Needles:   Procedures: ultrasound guided,,,,,,,,  Narrative:  Start time: 02/09/2017 9:01 AM End time: 02/09/2017 9:11 AM Injection made incrementally with aspirations every 5 mL.  Performed by: Personally

## 2017-02-09 NOTE — Transfer of Care (Signed)
Immediate Anesthesia Transfer of Care Note  Patient: Daniel Richard  Procedure(s) Performed: Procedure(s): OPEN REPAIR LEFT INGUINAL HERNIA  (Left) INSERTION OF MESH (Left)  Patient Location: PACU  Anesthesia Type:MAC and Spinal  Level of Consciousness: oriented, sedated and patient cooperative  Airway & Oxygen Therapy: Patient Spontanous Breathing and Patient connected to nasal cannula oxygen  Post-op Assessment: Report given to RN and Post -op Vital signs reviewed and stable  Post vital signs: Reviewed and stable  Last Vitals:  Vitals:   02/09/17 0926 02/09/17 0927  BP:    Pulse: 82 95  Resp: 12 12  Temp:      Last Pain:  Vitals:   02/09/17 0732  TempSrc: Oral      Patients Stated Pain Goal: 4 (28/41/32 4401)  Complications: No apparent anesthesia complications

## 2017-02-09 NOTE — Progress Notes (Addendum)
Patient is still unable to void. Ambulated entire hallway. Bladder scanner does not show any urine in bladder; however, previously scanned for 154m earlier in the day. Patient does not complain of any pressure with palpation of bladder. Patient is very frustrated that he is not able to void. Called MD. Dr NLucia Gaskinswants to wait to see if patient is able to void.  1815    Patient is able to void.

## 2017-02-12 ENCOUNTER — Encounter (HOSPITAL_COMMUNITY): Payer: Self-pay | Admitting: General Surgery

## 2017-04-03 ENCOUNTER — Encounter: Payer: BLUE CROSS/BLUE SHIELD | Admitting: *Deleted

## 2017-04-03 ENCOUNTER — Telehealth: Payer: Self-pay | Admitting: Cardiology

## 2017-04-03 NOTE — Telephone Encounter (Signed)
LMOVM reminding pt to send remote transmission.   

## 2017-04-06 ENCOUNTER — Encounter: Payer: Self-pay | Admitting: Cardiology

## 2017-04-09 DIAGNOSIS — R42 Dizziness and giddiness: Secondary | ICD-10-CM | POA: Diagnosis not present

## 2017-04-09 DIAGNOSIS — R55 Syncope and collapse: Secondary | ICD-10-CM | POA: Diagnosis not present

## 2017-04-09 DIAGNOSIS — R002 Palpitations: Secondary | ICD-10-CM | POA: Diagnosis not present

## 2017-04-11 ENCOUNTER — Other Ambulatory Visit: Payer: Self-pay | Admitting: Internal Medicine

## 2017-04-29 ENCOUNTER — Other Ambulatory Visit: Payer: Self-pay | Admitting: Internal Medicine

## 2017-06-12 ENCOUNTER — Other Ambulatory Visit: Payer: Self-pay | Admitting: Internal Medicine

## 2017-09-21 ENCOUNTER — Other Ambulatory Visit: Payer: Self-pay | Admitting: Internal Medicine

## 2017-10-22 ENCOUNTER — Other Ambulatory Visit: Payer: Self-pay | Admitting: Internal Medicine

## 2017-11-22 ENCOUNTER — Other Ambulatory Visit: Payer: Self-pay | Admitting: Internal Medicine

## 2017-11-29 ENCOUNTER — Other Ambulatory Visit: Payer: Self-pay | Admitting: Internal Medicine

## 2017-11-29 ENCOUNTER — Telehealth: Payer: Self-pay | Admitting: *Deleted

## 2017-11-29 MED ORDER — METOPROLOL SUCCINATE ER 50 MG PO TB24: 50.0000 mg | ORAL_TABLET | Freq: Every day | ORAL | 3 refills | Status: DC

## 2017-11-29 NOTE — Telephone Encounter (Signed)
Remote alert transmission received this morning, "VF" episode with ATP & shock on 11/18/17. Daniel Richard reports that he was out shoveling snow that Sunday and that he felt fine. He was pacing himself and didn't think he was putting himself at risk.   Reviewed episode with Dr. Caryl Comes- he agrees that this is AF with RVR. He gave an order to add metoprolol succinate 50mg  daily and to have him start with a half pill daily and increase to a whole pill in a weeks time. No driving restrictions. Daniel Richard aware of Dr. Olin Pia recommendations. I told Daniel Richard that the pharmacy may question the dual beta-blocker therapy but it is intentional. He verbalizes understanding.

## 2017-11-29 NOTE — Addendum Note (Signed)
Addended by: Ranee Gosselin on: 11/29/2017 09:18 AM   Modules accepted: Orders

## 2017-12-29 ENCOUNTER — Other Ambulatory Visit: Payer: Self-pay | Admitting: Internal Medicine

## 2017-12-31 ENCOUNTER — Telehealth: Payer: Self-pay | Admitting: Internal Medicine

## 2017-12-31 MED ORDER — APIXABAN 5 MG PO TABS: ORAL_TABLET | ORAL | 0 refills | Status: DC

## 2017-12-31 NOTE — Telephone Encounter (Signed)
Patient has not been seen since 2017, he needs an appointment for all refills.  Carson, please schedule pt an appt within the next 30 days. Once an appt has been made let me know and I will sent in a 30 day supply only.

## 2017-12-31 NOTE — Addendum Note (Signed)
Addended by: Juliet Rude on: 12/31/2017 11:36 AM   Modules accepted: Orders

## 2017-12-31 NOTE — Telephone Encounter (Signed)
Pt CB to schedule physical for 01/14/18

## 2017-12-31 NOTE — Telephone Encounter (Signed)
Copied from [place] [id number]. Topic: Quick Communication - See Telephone Encounter >> Dec 31, 2017 10:13 AM Conception Chancy, NT wrote: CRM for notification. See Telephone encounter for:  12/31/17.  [facility] notified patient that they need approval from Dr. Jenny Reichmann to refill his Eliquis. Please advise.

## 2017-12-31 NOTE — Telephone Encounter (Signed)
LM for pt to call back to schedule °

## 2018-01-14 ENCOUNTER — Encounter: Payer: Self-pay | Admitting: Internal Medicine

## 2018-01-14 ENCOUNTER — Ambulatory Visit (INDEPENDENT_AMBULATORY_CARE_PROVIDER_SITE_OTHER): Payer: BLUE CROSS/BLUE SHIELD | Admitting: Internal Medicine

## 2018-01-14 ENCOUNTER — Other Ambulatory Visit (INDEPENDENT_AMBULATORY_CARE_PROVIDER_SITE_OTHER): Payer: BLUE CROSS/BLUE SHIELD

## 2018-01-14 VITALS — BP 124/80 | HR 95 | Temp 97.7°F | Ht 72.0 in | Wt 189.0 lb

## 2018-01-14 DIAGNOSIS — Z114 Encounter for screening for human immunodeficiency virus [HIV]: Secondary | ICD-10-CM

## 2018-01-14 DIAGNOSIS — E785 Hyperlipidemia, unspecified: Secondary | ICD-10-CM

## 2018-01-14 DIAGNOSIS — I10 Essential (primary) hypertension: Secondary | ICD-10-CM

## 2018-01-14 DIAGNOSIS — Z0000 Encounter for general adult medical examination without abnormal findings: Secondary | ICD-10-CM | POA: Diagnosis not present

## 2018-01-14 DIAGNOSIS — Z1159 Encounter for screening for other viral diseases: Secondary | ICD-10-CM

## 2018-01-14 LAB — CBC WITH DIFFERENTIAL/PLATELET
Basophils Absolute: 0 10*3/uL (ref 0.0–0.1)
Basophils Relative: 0.5 % (ref 0.0–3.0)
Eosinophils Absolute: 0.1 10*3/uL (ref 0.0–0.7)
Eosinophils Relative: 1.4 % (ref 0.0–5.0)
HCT: 41.8 % (ref 39.0–52.0)
Hemoglobin: 14 g/dL (ref 13.0–17.0)
Lymphocytes Relative: 38.4 % (ref 12.0–46.0)
Lymphs Abs: 2.8 10*3/uL (ref 0.7–4.0)
MCHC: 33.4 g/dL (ref 30.0–36.0)
MCV: 92.2 fl (ref 78.0–100.0)
Monocytes Absolute: 0.8 10*3/uL (ref 0.1–1.0)
Monocytes Relative: 10.4 % (ref 3.0–12.0)
Neutro Abs: 3.6 10*3/uL (ref 1.4–7.7)
Neutrophils Relative %: 49.3 % (ref 43.0–77.0)
Platelets: 221 10*3/uL (ref 150.0–400.0)
RBC: 4.54 Mil/uL (ref 4.22–5.81)
RDW: 13 % (ref 11.5–15.5)
WBC: 7.4 10*3/uL (ref 4.0–10.5)

## 2018-01-14 LAB — URINALYSIS, ROUTINE W REFLEX MICROSCOPIC
Bilirubin Urine: NEGATIVE
Hgb urine dipstick: NEGATIVE
Ketones, ur: NEGATIVE
Leukocytes, UA: NEGATIVE
Nitrite: NEGATIVE
RBC / HPF: NONE SEEN (ref 0–?)
Specific Gravity, Urine: <=1.005 — AB (ref 1.000–1.030)
Total Protein, Urine: NEGATIVE
Urine Glucose: NEGATIVE
Urobilinogen, UA: 0.2 (ref 0.0–1.0)
WBC, UA: NONE SEEN (ref 0–?)
pH: 6.5 (ref 5.0–8.0)

## 2018-01-14 LAB — BASIC METABOLIC PANEL
BUN: 15 mg/dL (ref 6–23)
CO2: 30 mEq/L (ref 19–32)
Calcium: 9 mg/dL (ref 8.4–10.5)
Chloride: 105 mEq/L (ref 96–112)
Creatinine, Ser: 0.89 mg/dL (ref 0.40–1.50)
GFR: 92.44 mL/min (ref >60.00)
Glucose, Bld: 90 mg/dL (ref 70–99)
Potassium: 4.1 mEq/L (ref 3.5–5.1)
Sodium: 141 mEq/L (ref 135–145)

## 2018-01-14 LAB — LIPID PANEL
Cholesterol: 120 mg/dL (ref 0–200)
HDL: 35.8 mg/dL — ABNORMAL LOW (ref >39.00)
LDL Cholesterol: 48 mg/dL (ref 0–99)
NonHDL: 84.56
Total CHOL/HDL Ratio: 3
Triglycerides: 183 mg/dL — ABNORMAL HIGH (ref 0.0–149.0)
VLDL: 36.6 mg/dL (ref 0.0–40.0)

## 2018-01-14 LAB — HEPATIC FUNCTION PANEL
ALT: 26 U/L (ref 0–53)
AST: 26 U/L (ref 0–37)
Albumin: 4.3 g/dL (ref 3.5–5.2)
Alkaline Phosphatase: 79 U/L (ref 39–117)
Bilirubin, Direct: 0.1 mg/dL (ref 0.0–0.3)
Total Bilirubin: 0.7 mg/dL (ref 0.2–1.2)
Total Protein: 7.1 g/dL (ref 6.0–8.3)

## 2018-01-14 LAB — PSA: PSA: 0.4 ng/mL (ref 0.10–4.00)

## 2018-01-14 LAB — TSH: TSH: 0.81 u[IU]/mL (ref 0.35–4.50)

## 2018-01-14 MED ORDER — APIXABAN 5 MG PO TABS: ORAL_TABLET | ORAL | 11 refills | Status: DC

## 2018-01-14 MED ORDER — AMLODIPINE BESYLATE 2.5 MG PO TABS: 2.5000 mg | ORAL_TABLET | Freq: Every day | ORAL | 3 refills | Status: DC

## 2018-01-14 MED ORDER — CARVEDILOL 25 MG PO TABS: 12.5000 mg | ORAL_TABLET | Freq: Two times a day (BID) | ORAL | 3 refills | Status: DC

## 2018-01-14 NOTE — Patient Instructions (Addendum)
You will be signed up for Cologuard, but please find out if this is covered under your insurance.  Please continue all other medications as before, and refills have been done if requested.  Please have the pharmacy call with any other refills you may need.  Please continue your efforts at being more active, low cholesterol diet, and weight control.  You are otherwise up to date with prevention measures today.  Please keep your appointments with your specialists as you may have planned  Please go to the LAB in the Basement (turn left off the elevator) for the tests to be done today  You will be contacted by phone if any changes need to be made immediately.  Otherwise, you will receive a letter about your results with an explanation, but please check with MyChart first.  Please remember to sign up for MyChart if you have not done so, as this will be important to you in the future with finding out test results, communicating by private email, and scheduling acute appointments online when needed.  Please return in 1 year for your yearly visit, or sooner if needed, with Lab testing done 3-5 days before

## 2018-01-14 NOTE — Progress Notes (Signed)
Subjective:    Patient ID: Daniel Richard, male    DOB: 12-08-1956, 62 y.o.   MRN: [id number]  HPI  Here for wellness and f/u;  Overall doing ok;  Pt denies Chest pain, worsening SOB, DOE, wheezing, orthopnea, PND, worsening LE edema, palpitations, dizziness or syncope.  Pt denies neurological change such as new headache, facial or extremity weakness.  Pt denies polydipsia, polyuria, or low sugar symptoms. Pt states overall good compliance with treatment and medications, good tolerability, and has been trying to follow appropriate diet.  Pt denies worsening depressive symptoms, suicidal ideation or panic. No fever, night sweats, wt loss, loss of appetite, or other constitutional symptoms.  Pt states good ability with ADL's, has low fall risk, home safety reviewed and adequate, no other significant changes in hearing or vision, and not active with exercise. Declines flu shot. BP Readings from Last 3 Encounters:  01/14/18 124/80  02/09/17 120/86  02/01/17 124/81   Wt Readings from Last 3 Encounters:  01/14/18 189 lb (85.7 kg)  02/09/17 184 lb 11 oz (83.8 kg)  02/01/17 184 lb 11.2 oz (83.8 kg)  Did have an AICD d/c with shoveling snow a few wks ago, even with a few breaks..  Felt essentially fine before as well as after, but had marked discomfort for a second mostly to the mid back and blue lightening flash to vision. No fall or LOC.   Thought initially he had broken a bone in the spine.. Was told after he was shocked due to HR 240.     Past Medical History:  Diagnosis Date  . AICD (automatic cardioverter/defibrillator) present    ST JUDE  . ALLERGIC RHINITIS   . ANXIETY   . Arthritis    IN FINGERS  . Cardiac arrest ([place]) 11/21/2015  . Cardiac device in situ   . HYPERLIPIDEMIA   . HYPERTENSION   . LOW BACK PAIN   . Myocardial infarction ([place]) 11/21/2015  . Paroxysmal atrial fibrillation [facility])    Past Surgical History:  Procedure Laterality Date  . CARDIAC CATHETERIZATION N/A  11/21/2015   Procedure: Left Heart Cath and Coronary Angiography;  Surgeon: Wellington Hampshire, MD;  Location: [place] [facility];  Service: Cardiovascular;  Laterality: N/A;  . EP IMPLANTABLE DEVICE N/A 11/29/2015   Procedure: ICD Implant;  Surgeon: Deboraha Sprang, MD;  Location: [place] [facility];  Service: Cardiovascular;  Laterality: N/A;  . INGUINAL HERNIA REPAIR Left 02/09/2017   Procedure: OPEN REPAIR LEFT INGUINAL HERNIA ;  Surgeon: Greer Pickerel, MD;  Location: [facility];  Service: General;  Laterality: Left;  . INSERTION OF MESH Left 02/09/2017   Procedure: INSERTION OF MESH;  Surgeon: Greer Pickerel, MD;  Location: [facility];  Service: General;  Laterality: Left;    reports that he quit smoking about 10 years ago. His smoking use included cigarettes. He has a 20.00 pack-year smoking history. he has never used smokeless tobacco. He reports that he does not drink alcohol or use drugs. family history includes Hypertension in his other. Allergies  Allergen Reactions  . Lipitor [Atorvastatin]     myalgia  . Meperidine Hcl Other (See Comments)    "TURNED WHITE AS A SHEEP, SWEATY COULD NOT STAND" DEMEROL   Current Outpatient Medications on File Prior to Visit  Medication Sig Dispense Refill  . aspirin EC 81 MG tablet Take 81 mg by mouth daily.    Marland Kitchen b complex vitamins tablet Take 1 tablet by mouth daily.    Marland Kitchen  ibuprofen (ADVIL,MOTRIN) 200 MG tablet Take 200 mg by mouth every 6 (six) hours as needed (Pain).    . metoprolol succinate (TOPROL-XL) 50 MG 24 hr tablet Take 1 tablet (50 mg total) by mouth daily. Take with or immediately following a meal. 90 tablet 3  . Multiple Vitamins-Minerals (MULTIVITAMIN WITH MINERALS) tablet Take 1 tablet by mouth daily.    Marland Kitchen oxyCODONE (OXY IR/ROXICODONE) 5 MG immediate release tablet Take 1-2 tablets (5-10 mg total) by mouth every 4 (four) hours as needed for moderate pain, severe pain or breakthrough pain. 40 tablet 0  . rosuvastatin (CRESTOR) 40 MG tablet Take 1 tablet  (40 mg total) by mouth daily. **PT NEEDS PHYSICAL FOR ADDITIONAL REFILLS** 90 tablet 0   No current facility-administered medications on file prior to visit.    Review of Systems  Constitutional: Negative for other unusual diaphoresis, sweats, appetite or weight changes HENT: Negative for other worsening hearing loss, ear pain, facial swelling, mouth sores or neck stiffness.   Eyes: Negative for other worsening pain, redness or other visual disturbance.  Respiratory: Negative for other stridor or swelling Cardiovascular: Negative for other palpitations or other chest pain  Gastrointestinal: Negative for worsening diarrhea or loose stools, blood in stool, distention or other pain Genitourinary: Negative for hematuria, flank pain or other change in urine volume.  Musculoskeletal: Negative for myalgias or other joint swelling.  Skin: Negative for other color change, or other wound or worsening drainage.  Neurological: Negative for other syncope or numbness. Hematological: Negative for other adenopathy or swelling Psychiatric/Behavioral: Negative for hallucinations, other worsening agitation, SI, self-injury, or new decreased concentration All other system neg per pt     Objective:   Physical Exam BP 124/80   Pulse 95   Temp 97.7 F (36.5 C) (Oral)   Ht 6' (1.829 m)   Wt 189 lb (85.7 kg)   SpO2 97%   BMI 25.63 kg/m  VS noted,  Constitutional: Pt is oriented to person, place, and time. Appears well-developed and well-nourished, in no significant distress and comfortable Head: Normocephalic and atraumatic  Eyes: Conjunctivae and EOM are normal. Pupils are equal, round, and reactive to light Right Ear: External ear normal without discharge Left Ear: External ear normal without discharge Nose: Nose without discharge or deformity Mouth/Throat: Oropharynx is without other ulcerations and moist  Neck: Normal range of motion. Neck supple. No JVD present. No tracheal deviation present or  significant neck LA or mass Cardiovascular: Normal rate, irregular rhythm, normal heart sounds and intact distal pulses.   Pulmonary/Chest: WOB normal and breath sounds without rales or wheezing  Abdominal: Soft. Bowel sounds are normal. NT. No HSM  Musculoskeletal: Normal range of motion. Exhibits no edema Lymphadenopathy: Has no other cervical adenopathy.  Neurological: Pt is alert and oriented to person, place, and time. Pt has normal reflexes. No cranial nerve deficit. Motor grossly intact, Gait intact Skin: Skin is warm and dry. No rash noted or new ulcerations Psychiatric:  Has mild nervous mood and affect. Behavior is normal without agitation No other exam findings Lab Results  Component Value Date   WBC 9.5 02/01/2017   HGB 14.4 02/01/2017   HCT 42.8 02/01/2017   PLT 237 02/01/2017   GLUCOSE 90 02/01/2017   CHOL 139 11/28/2016   TRIG 196.0 (H) 11/28/2016   HDL 39.00 (L) 11/28/2016   LDLDIRECT 108.0 03/02/2016   LDLCALC 61 11/28/2016   ALT 22 11/28/2016   AST 25 11/28/2016   NA 141 02/01/2017   K  4.7 02/01/2017   CL 107 02/01/2017   CREATININE 0.74 02/01/2017   BUN 11 02/01/2017   CO2 27 02/01/2017   TSH 1.52 11/28/2016   PSA 0.48 11/28/2016   INR 0.99 02/09/2017       Assessment & Plan:

## 2018-01-14 NOTE — Assessment & Plan Note (Signed)
For f/u lipids today, goal < 70

## 2018-01-14 NOTE — Assessment & Plan Note (Signed)
stable overall by history and exam, recent data reviewed with pt, and pt to continue medical treatment as before,  to f/u any worsening symptoms or concerns  

## 2018-01-14 NOTE — Assessment & Plan Note (Signed)

## 2018-01-15 LAB — HEPATITIS C ANTIBODY
Hepatitis C Ab: NONREACTIVE
SIGNAL TO CUT-OFF: 0.01 (ref <1.00)

## 2018-01-15 LAB — HIV ANTIBODY (ROUTINE TESTING W REFLEX): HIV 1&2 Ab, 4th Generation: NONREACTIVE

## 2018-03-12 ENCOUNTER — Other Ambulatory Visit: Payer: Self-pay | Admitting: Internal Medicine

## 2018-06-14 ENCOUNTER — Encounter: Payer: Self-pay | Admitting: Cardiology

## 2018-12-31 DIAGNOSIS — L723 Sebaceous cyst: Secondary | ICD-10-CM | POA: Diagnosis not present

## 2018-12-31 DIAGNOSIS — L989 Disorder of the skin and subcutaneous tissue, unspecified: Secondary | ICD-10-CM | POA: Diagnosis not present

## 2018-12-31 DIAGNOSIS — L089 Local infection of the skin and subcutaneous tissue, unspecified: Secondary | ICD-10-CM | POA: Diagnosis not present

## 2019-01-27 ENCOUNTER — Other Ambulatory Visit: Payer: Self-pay | Admitting: Internal Medicine

## 2019-02-06 ENCOUNTER — Other Ambulatory Visit (INDEPENDENT_AMBULATORY_CARE_PROVIDER_SITE_OTHER): Payer: BLUE CROSS/BLUE SHIELD

## 2019-02-06 ENCOUNTER — Encounter: Payer: Self-pay | Admitting: Internal Medicine

## 2019-02-06 ENCOUNTER — Ambulatory Visit: Payer: BLUE CROSS/BLUE SHIELD | Admitting: Internal Medicine

## 2019-02-06 VITALS — BP 144/88 | HR 97 | Temp 97.9°F | Ht 72.0 in | Wt 185.0 lb

## 2019-02-06 DIAGNOSIS — Z0000 Encounter for general adult medical examination without abnormal findings: Secondary | ICD-10-CM | POA: Diagnosis not present

## 2019-02-06 DIAGNOSIS — I10 Essential (primary) hypertension: Secondary | ICD-10-CM

## 2019-02-06 LAB — BASIC METABOLIC PANEL
BUN: 14 mg/dL (ref 6–23)
CO2: 27 mEq/L (ref 19–32)
Calcium: 9.6 mg/dL (ref 8.4–10.5)
Chloride: 103 mEq/L (ref 96–112)
Creatinine, Ser: 0.85 mg/dL (ref 0.40–1.50)
GFR: 91.39 mL/min (ref >60.00)
Glucose, Bld: 73 mg/dL (ref 70–99)
Potassium: 4.1 mEq/L (ref 3.5–5.1)
Sodium: 140 mEq/L (ref 135–145)

## 2019-02-06 LAB — URINALYSIS, ROUTINE W REFLEX MICROSCOPIC
Bilirubin Urine: NEGATIVE
Hgb urine dipstick: NEGATIVE
Ketones, ur: NEGATIVE
Leukocytes,Ua: NEGATIVE
Nitrite: NEGATIVE
RBC / HPF: NONE SEEN (ref 0–?)
Specific Gravity, Urine: 1.01 (ref 1.000–1.030)
Total Protein, Urine: NEGATIVE
Urine Glucose: NEGATIVE
Urobilinogen, UA: 0.2 (ref 0.0–1.0)
pH: 6 (ref 5.0–8.0)

## 2019-02-06 LAB — CBC WITH DIFFERENTIAL/PLATELET
Basophils Absolute: 0.1 10*3/uL (ref 0.0–0.1)
Basophils Relative: 0.5 % (ref 0.0–3.0)
Eosinophils Absolute: 0.1 10*3/uL (ref 0.0–0.7)
Eosinophils Relative: 0.7 % (ref 0.0–5.0)
HCT: 45.1 % (ref 39.0–52.0)
Hemoglobin: 15.2 g/dL (ref 13.0–17.0)
Lymphocytes Relative: 27.8 % (ref 12.0–46.0)
Lymphs Abs: 2.8 10*3/uL (ref 0.7–4.0)
MCHC: 33.7 g/dL (ref 30.0–36.0)
MCV: 96.7 fl (ref 78.0–100.0)
Monocytes Absolute: 1.2 10*3/uL — ABNORMAL HIGH (ref 0.1–1.0)
Monocytes Relative: 12.1 % — ABNORMAL HIGH (ref 3.0–12.0)
Neutro Abs: 6 10*3/uL (ref 1.4–7.7)
Neutrophils Relative %: 58.9 % (ref 43.0–77.0)
Platelets: 208 10*3/uL (ref 150.0–400.0)
RBC: 4.67 Mil/uL (ref 4.22–5.81)
RDW: 13.6 % (ref 11.5–15.5)
WBC: 10.1 10*3/uL (ref 4.0–10.5)

## 2019-02-06 LAB — TSH: TSH: 1.7 u[IU]/mL (ref 0.35–4.50)

## 2019-02-06 LAB — LIPID PANEL
Cholesterol: 169 mg/dL (ref 0–200)
HDL: 51.9 mg/dL (ref >39.00)
LDL Cholesterol: 86 mg/dL (ref 0–99)
NonHDL: 117.34
Total CHOL/HDL Ratio: 3
Triglycerides: 159 mg/dL — ABNORMAL HIGH (ref 0.0–149.0)
VLDL: 31.8 mg/dL (ref 0.0–40.0)

## 2019-02-06 LAB — HEPATIC FUNCTION PANEL
ALT: 29 U/L (ref 0–53)
AST: 31 U/L (ref 0–37)
Albumin: 4.8 g/dL (ref 3.5–5.2)
Alkaline Phosphatase: 84 U/L (ref 39–117)
Bilirubin, Direct: 0.1 mg/dL (ref 0.0–0.3)
Total Bilirubin: 0.6 mg/dL (ref 0.2–1.2)
Total Protein: 7.7 g/dL (ref 6.0–8.3)

## 2019-02-06 LAB — PSA: PSA: 0.39 ng/mL (ref 0.10–4.00)

## 2019-02-06 MED ORDER — CARVEDILOL 25 MG PO TABS: 12.5000 mg | ORAL_TABLET | Freq: Two times a day (BID) | ORAL | 3 refills | Status: DC

## 2019-02-06 MED ORDER — AMLODIPINE BESYLATE 2.5 MG PO TABS: 2.5000 mg | ORAL_TABLET | Freq: Every day | ORAL | 3 refills | Status: DC

## 2019-02-06 MED ORDER — ROSUVASTATIN CALCIUM 40 MG PO TABS: ORAL_TABLET | ORAL | 3 refills | Status: DC

## 2019-02-06 MED ORDER — APIXABAN 5 MG PO TABS: ORAL_TABLET | ORAL | 3 refills | Status: DC

## 2019-02-06 MED ORDER — METOPROLOL SUCCINATE ER 50 MG PO TB24: 50.0000 mg | ORAL_TABLET | Freq: Every day | ORAL | 3 refills | Status: DC

## 2019-02-06 NOTE — Assessment & Plan Note (Signed)

## 2019-02-06 NOTE — Progress Notes (Signed)
Subjective:    Patient ID: Daniel Richard, male    DOB: 03/27/1956, 63 y.o.   MRN: [id number]  HPI  Here for wellness and f/u;  Overall doing ok;  Pt denies Chest pain, worsening SOB, DOE, wheezing, orthopnea, PND, worsening LE edema, palpitations, dizziness or syncope.  Pt denies neurological change such as new headache, facial or extremity weakness.  Pt denies polydipsia, polyuria, or low sugar symptoms. Pt states overall good compliance with treatment and medications, good tolerability, and has been trying to follow appropriate diet.  Pt denies worsening depressive symptoms, suicidal ideation or panic. No fever, night sweats, wt loss, loss of appetite, or other constitutional symptoms.  Pt states good ability with ADL's, has low fall risk, home safety reviewed and adequate, no other significant changes in hearing or vision, and only occasionally active with exercise.  No new complaints Past Medical History:  Diagnosis Date  . AICD (automatic cardioverter/defibrillator) present    ST JUDE  . ALLERGIC RHINITIS   . ANXIETY   . Arthritis    IN FINGERS  . Cardiac arrest ([place]) 11/21/2015  . Cardiac device in situ   . HYPERLIPIDEMIA   . HYPERTENSION   . LOW BACK PAIN   . Myocardial infarction ([place]) 11/21/2015  . Paroxysmal atrial fibrillation [facility])    Past Surgical History:  Procedure Laterality Date  . CARDIAC CATHETERIZATION N/A 11/21/2015   Procedure: Left Heart Cath and Coronary Angiography;  Surgeon: Wellington Hampshire, MD;  Location: [place] [facility];  Service: Cardiovascular;  Laterality: N/A;  . EP IMPLANTABLE DEVICE N/A 11/29/2015   Procedure: ICD Implant;  Surgeon: Deboraha Sprang, MD;  Location: [place] [facility];  Service: Cardiovascular;  Laterality: N/A;  . INGUINAL HERNIA REPAIR Left 02/09/2017   Procedure: OPEN REPAIR LEFT INGUINAL HERNIA ;  Surgeon: Greer Pickerel, MD;  [facility];  Service: General;  Laterality: Left;  . INSERTION OF MESH Left 02/09/2017   Procedure:  INSERTION OF MESH;  Surgeon: Greer Pickerel, MD;  [facility];  Service: General;  Laterality: Left;    reports that he quit smoking about 11 years ago. His smoking use included cigarettes. He has a 20.00 pack-year smoking history. He has never used smokeless tobacco. He reports that he does not drink alcohol or use drugs. family history includes Hypertension in an other family member. Allergies  Allergen Reactions  . Lipitor [Atorvastatin]     myalgia  . Meperidine Hcl Other (See Comments)    "TURNED WHITE AS A SHEEP, SWEATY COULD NOT STAND" DEMEROL   Current Outpatient Medications on File Prior to Visit  Medication Sig Dispense Refill  . aspirin EC 81 MG tablet Take 81 mg by mouth daily.    Marland Kitchen b complex vitamins tablet Take 1 tablet by mouth daily.    Marland Kitchen ibuprofen (ADVIL,MOTRIN) 200 MG tablet Take 200 mg by mouth every 6 (six) hours as needed (Pain).    . Multiple Vitamins-Minerals (MULTIVITAMIN WITH MINERALS) tablet Take 1 tablet by mouth daily.    Marland Kitchen oxyCODONE (OXY IR/ROXICODONE) 5 MG immediate release tablet Take 1-2 tablets (5-10 mg total) by mouth every 4 (four) hours as needed for moderate pain, severe pain or breakthrough pain. 40 tablet 0   No current facility-administered medications on file prior to visit.    Review of Systems  Constitutional: Negative for other unusual diaphoresis or sweats HENT: Negative for ear discharge or swelling Eyes: Negative for other worsening visual disturbances Respiratory: Negative for stridor or  other swelling  Gastrointestinal: Negative for worsening distension or other blood Genitourinary: Negative for retention or other urinary change Musculoskeletal: Negative for other MSK pain or swelling Skin: Negative for color change or other new lesions Neurological: Negative for worsening tremors and other numbness  Psychiatric/Behavioral: Negative for worsening agitation or other fatigue All other system neg per pt    Objective:   Physical  Exam BP (!) 144/88   Pulse 97   Temp 97.9 F (36.6 C) (Oral)   Ht 6' (1.829 m)   Wt 185 lb (83.9 kg)   SpO2 100%   BMI 25.09 kg/m  VS noted,  Constitutional: Pt is oriented to person, place, and time. Appears well-developed and well-nourished, in no significant distress and comfortable Head: Normocephalic and atraumatic  Eyes: Conjunctivae and EOM are normal. Pupils are equal, round, and reactive to light Right Ear: External ear normal without discharge Left Ear: External ear normal without discharge Nose: Nose without discharge or deformity Mouth/Throat: Oropharynx is without other ulcerations and moist  Neck: Normal range of motion. Neck supple. No JVD present. No tracheal deviation present or significant neck LA or mass Cardiovascular: Normal rate, regular rhythm, normal heart sounds and intact distal pulses.   Pulmonary/Chest: WOB normal and breath sounds without rales or wheezing  Abdominal: Soft. Bowel sounds are normal. NT. No HSM  Musculoskeletal: Normal range of motion. Exhibits no edema Lymphadenopathy: Has no other cervical adenopathy.  Neurological: Pt is alert and oriented to person, place, and time. Pt has normal reflexes. No cranial nerve deficit. Motor grossly intact, Gait intact Skin: Skin is warm and dry. No rash noted or new ulcerations Psychiatric:  Has normal mood and affect. Behavior is normal without agitation\ No other exam findings Lab Results  Component Value Date   WBC 10.1 02/06/2019   HGB 15.2 02/06/2019   HCT 45.1 02/06/2019   PLT 208.0 02/06/2019   GLUCOSE 73 02/06/2019   CHOL 169 02/06/2019   TRIG 159.0 (H) 02/06/2019   HDL 51.90 02/06/2019   LDLDIRECT 108.0 03/02/2016   LDLCALC 86 02/06/2019   ALT 29 02/06/2019   AST 31 02/06/2019   NA 140 02/06/2019   K 4.1 02/06/2019   CL 103 02/06/2019   CREATININE 0.85 02/06/2019   BUN 14 02/06/2019   CO2 27 02/06/2019   TSH 1.70 02/06/2019   PSA 0.39 02/06/2019   INR 0.99 02/09/2017        Assessment & Plan:

## 2019-02-06 NOTE — Assessment & Plan Note (Signed)
stable overall by history and exam, recent data reviewed with pt, and pt to continue medical treatment as before,  to f/u any worsening symptoms or concerns  

## 2019-02-06 NOTE — Patient Instructions (Signed)

## 2019-04-04 ENCOUNTER — Telehealth: Payer: Self-pay | Admitting: Physician Assistant

## 2019-04-04 NOTE — Telephone Encounter (Signed)
Called regarding over due device check.  Left message on his cell/home number with DC # to call and make an appointment to update device check.   Tommye Standard, PA-C

## 2019-05-06 ENCOUNTER — Telehealth: Payer: Self-pay

## 2019-05-06 NOTE — Telephone Encounter (Signed)
I called and left patient a message about setting up telehealth visit. Patient is on Dr. Olin Pia recall schedule and Caryl Comes has several openings tomorrow.

## 2019-05-16 NOTE — Telephone Encounter (Signed)
Patient did not return my call to schedule follow up appointment.

## 2019-05-23 ENCOUNTER — Telehealth: Payer: Self-pay | Admitting: Internal Medicine

## 2019-05-23 NOTE — Telephone Encounter (Signed)
He has been informed and expressed understanding. He did mention that he was under the impression that he was to not walk through a metal detector and stated that "stress is my worse enemy". He then proceeds to as "So basically the answer is no?" I replied and stated based on the notations I have in front of me per PCP that would be correct. The pt then disconnected the phone.

## 2019-05-23 NOTE — Telephone Encounter (Signed)
Patient inquired if Dr. Jenny Reichmann would be willing to compose a letter excusing patient from jury duty (scheduled 6/26) as patient states he has a defibrillator and he feels that he would not handle the stress/anxiety of jury duty well. If possible, patientrequests that letter be printed on Section letterhead with signature of Dr. Jenny Reichmann. Patient is requesting a call back from CMA to discuss further, if needed.

## 2019-05-23 NOTE — Telephone Encounter (Signed)
Very sorry, but this is not a realistic reason to excuse from jury duty, and I know deep down he is proud to be an American who participates in the justice system which is all of our duty as citizens

## 2019-06-12 ENCOUNTER — Telehealth: Payer: Self-pay | Admitting: *Deleted

## 2019-06-12 ENCOUNTER — Ambulatory Visit (INDEPENDENT_AMBULATORY_CARE_PROVIDER_SITE_OTHER): Payer: BC Managed Care – PPO | Admitting: *Deleted

## 2019-06-12 DIAGNOSIS — I469 Cardiac arrest, cause unspecified: Secondary | ICD-10-CM

## 2019-06-12 LAB — CUP PACEART REMOTE DEVICE CHECK
Battery Remaining Longevity: 61 mo
Battery Remaining Percentage: 59 %
Battery Voltage: 2.98 V
Brady Statistic RV Percent Paced: 1 %
Date Time Interrogation Session: 20200702052056
HighPow Impedance: 48 Ohm
HighPow Impedance: 48 Ohm
Implantable Lead Implant Date: 20161219
Implantable Lead Location: 753860
Implantable Pulse Generator Implant Date: 20161219
Lead Channel Impedance Value: 390 Ohm
Lead Channel Pacing Threshold Amplitude: 1 V
Lead Channel Pacing Threshold Pulse Width: 0.5 ms
Lead Channel Sensing Intrinsic Amplitude: 9.2 mV
Lead Channel Setting Pacing Amplitude: 2.5 V
Lead Channel Setting Pacing Pulse Width: 0.5 ms
Lead Channel Setting Sensing Sensitivity: 0.5 mV
Pulse Gen Serial Number: 7219005

## 2019-06-12 NOTE — Telephone Encounter (Signed)
LMOVM requesting call back to DC.   Pt has been lost to f/u since 2018. Transmission received 06/12/19 from pt's home monitor, shows 2 VT episodes treated with ATP, 3 VF episodes treated with shocks since 05/09/2017. Most recent VT episode on 05/31/19, treated with ATP x3, discriminators disagree, EGM suggests AF w/RVR. Full transmission exported to Dr. Caryl Comes for review.
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
## 2019-06-18 ENCOUNTER — Encounter: Payer: Self-pay | Admitting: Internal Medicine

## 2019-06-18 ENCOUNTER — Other Ambulatory Visit: Payer: Self-pay

## 2019-06-18 ENCOUNTER — Ambulatory Visit: Payer: BC Managed Care – PPO | Admitting: Internal Medicine

## 2019-06-18 VITALS — BP 182/106 | HR 98 | Ht 72.0 in | Wt 183.2 lb

## 2019-06-18 DIAGNOSIS — I10 Essential (primary) hypertension: Secondary | ICD-10-CM

## 2019-06-18 DIAGNOSIS — I480 Paroxysmal atrial fibrillation: Secondary | ICD-10-CM

## 2019-06-18 DIAGNOSIS — Z95810 Presence of automatic (implantable) cardiac defibrillator: Secondary | ICD-10-CM

## 2019-06-18 DIAGNOSIS — I469 Cardiac arrest, cause unspecified: Secondary | ICD-10-CM

## 2019-06-18 MED ORDER — VERAPAMIL HCL ER 120 MG PO TBCR: 120.0000 mg | EXTENDED_RELEASE_TABLET | Freq: Every day | ORAL | 3 refills | Status: DC

## 2019-06-18 NOTE — Progress Notes (Signed)
Patient Care Team: Biagio Borg, MD as PCP - General   HPI  Daniel Richard is a 63 y.o. male Seen in follow-up for cardiac arrest that occurred while playing the piano. He received an [place] ICD 12/16. The operative note is incomplete unfortunately. It describes failure to convert on 2 occasions which was true. We then reversed polarity and had first shock success.  Cardiac evaluation initially demonstrated an EF of 40% with an occluded first diagonal with collaterals. Subsequent ejection fraction by echo was 55-60%.  MRI was incomplete without gadolinium because of claustrophobia overall ejection fraction was normal without regional wall motion abnormalities  He has some intermittent palpitations.  Associated with shortness of breath with some lightheadedness.  He reminds me that his mother died of a stroke following a cardioversion; he is uninterested in cardioversion.  No chest pain no shortness of breath no edema     Past Medical History:  Diagnosis Date  . AICD (automatic cardioverter/defibrillator) present    ST JUDE  . ALLERGIC RHINITIS   . ANXIETY   . Arthritis    IN FINGERS  . Cardiac arrest ([place]) 11/21/2015  . Cardiac device in situ   . HYPERLIPIDEMIA   . HYPERTENSION   . LOW BACK PAIN   . Myocardial infarction ([place]) 11/21/2015  . Paroxysmal atrial fibrillation [facility])     Past Surgical History:  Procedure Laterality Date  . CARDIAC CATHETERIZATION N/A 11/21/2015   Procedure: Left Heart Cath and Coronary Angiography;  Surgeon: Wellington Hampshire, MD;  Location: [place] [facility];  Service: Cardiovascular;  Laterality: N/A;  . EP IMPLANTABLE DEVICE N/A 11/29/2015   Procedure: ICD Implant;  Surgeon: Deboraha Sprang, MD;  Location: [place] [facility];  Service: Cardiovascular;  Laterality: N/A;  . INGUINAL HERNIA REPAIR Left 02/09/2017   Procedure: OPEN REPAIR LEFT INGUINAL HERNIA ;  Surgeon: Greer Pickerel, MD;  [facility];  Service: General;   Laterality: Left;  . INSERTION OF MESH Left 02/09/2017   Procedure: INSERTION OF MESH;  Surgeon: Greer Pickerel, MD;  [facility];  Service: General;  Laterality: Left;    Current Outpatient Medications  Medication Sig Dispense Refill  . amLODipine (NORVASC) 2.5 MG tablet Take 1 tablet (2.5 mg total) by mouth daily. 90 tablet 3  . apixaban (ELIQUIS) 5 MG TABS tablet TAKE 1 TABLET (5 MG TOTAL) BY MOUTH 2 (TWO) TIMES DAILY. 180 tablet 3  . aspirin EC 81 MG tablet Take 81 mg by mouth daily.    Marland Kitchen b complex vitamins tablet Take 1 tablet by mouth daily.    . carvedilol (COREG) 25 MG tablet Take 0.5 tablets (12.5 mg total) by mouth 2 (two) times daily with a meal. 90 tablet 3  . ibuprofen (ADVIL,MOTRIN) 200 MG tablet Take 200 mg by mouth every 6 (six) hours as needed (Pain).    . Multiple Vitamins-Minerals (MULTIVITAMIN WITH MINERALS) tablet Take 1 tablet by mouth daily.    . rosuvastatin (CRESTOR) 40 MG tablet Take 40 mg by mouth daily.     No current facility-administered medications for this visit.     Allergies  Allergen Reactions  . Lipitor [Atorvastatin]     myalgia  . Meperidine Hcl Other (See Comments)    "TURNED WHITE AS A SHEEP, SWEATY COULD NOT STAND" DEMEROL      Review of Systems negative except from HPI and PMH  Physical Exam BP (!) 182/106   Pulse 98  Ht 6' (1.829 m)   Wt 183 lb 3.2 oz (83.1 kg)   SpO2 98%   BMI 24.85 kg/m  Well developed and nourished in no acute distress HENT normal Neck supple with JVP-flat Carotids brisk  Clear Irregularly irregular rate and rhythm with controlled ventricular response, no murmurs or gallops Abd-soft with active BS without hepatomegaly No Clubbing cyanosis edema Skin-warm and dry A & Oriented  Grossly normal sensory and motor function   ECG afib @ 98  Assessment and  Plan  Aborted cardiac arrest  Hypertension  Atrial fibrillation-persistent  Implantable defibrillator-St. Jude  Anxiety/OCD  BP poorly  controlled  Heart rate also poorly controlled.  It appears as if he has been in atrial fibrillation since 12/18. Heart rate is about 95.  We will discontinue amlodipine and put him on verapamil 120.  Reviewed side effects.  We will anticipate looking at the transmission in about 3 months and hoping that we have moved the median heart rate down into the 80s.  Offered an echocardiogram to look at LV function given persistent rapid atrial fibrillation; he declined.  As noted above, he is not interested in cardioversion.  We will stop his aspirin.  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  His anxiety is very high.    We spent more than 50% of our >25 min visit in face to face counseling regarding the above

## 2019-06-18 NOTE — Patient Instructions (Addendum)
Medication Instructions:  Your physician has recommended you make the following change in your medication:   1. Stop Amlodipine 2. Begin Verapamil 120mg , one capsule daily 3. Stop Aspirin  Labwork: None ordered.  Testing/Procedures: None ordered.  Follow-Up: Your physician recommends that you schedule a follow-up appointment in:   12 months with Dr. Caryl Comes    Any Other Special Instructions Will Be Listed Below (If Applicable).     If you need a refill on your cardiac medications before your next appointment, please call your pharmacy.

## 2019-06-20 ENCOUNTER — Encounter: Payer: Self-pay | Admitting: Cardiology

## 2019-06-20 NOTE — Progress Notes (Signed)
Remote ICD transmission.   

## 2019-09-02 ENCOUNTER — Encounter: Payer: Self-pay | Admitting: Internal Medicine

## 2019-09-11 ENCOUNTER — Encounter: Payer: BC Managed Care – PPO | Admitting: *Deleted

## 2019-09-18 ENCOUNTER — Encounter: Payer: Self-pay | Admitting: Cardiology

## 2019-09-29 ENCOUNTER — Ambulatory Visit: Payer: Self-pay | Admitting: *Deleted

## 2019-09-29 NOTE — Telephone Encounter (Signed)
[place] for OV if passes screening for covid, thanks

## 2019-09-29 NOTE — Telephone Encounter (Signed)
  Pt called in concerned about his BP being slightly elevated, 150/107, being red in the face, not sleeping well and feeling nauseated for the past month on and off.   "I don't know if I'm anxious because of this COVID-19 virus stuff or what".   "I work at [employer] and they are really strict about no one being on campus that is sick".   I was wanting to come in and have Dr. Jenny Reichmann just make sure everything is ok.     He denies missing any of his BP medication doses.  No other symptoms like a runny nose, fever, etc per pt.   I warm transferred his call into Dr. Gwynn Burly office to [place] to be scheduled.  Reason for Disposition . Nausea lasts > 1 week    BP was slightly elevated in the office.   I've been nauseas, red in the face, and elevated BP for a month now.  Answer Assessment - Initial Assessment Questions 1. NAUSEA SEVERITY: "How bad is the nausea?" (e.g., mild, moderate, severe; dehydration, weight loss)   - MILD: loss of appetite without change in eating habits   - MODERATE: decreased oral intake without significant weight loss, dehydration, or malnutrition   - SEVERE: inadequate caloric or fluid intake, significant weight loss, symptoms of dehydration     My BP is 150/107 yesterday afternoon.   P-97 This morning 140/97  P-97 I've been nauseas.  My face is red.    2. ONSET: "When did the nausea begin?"     This has been going on for a month.   I don't have a good appetite.    I'm achy and I'm not sleeping well.    I pee a lot at night. 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 I'm worried about my kidneys.    I take Amlodopine for my BP.    3. VOMITING: "Any vomiting?" If so, ask: "How many times today?"     No vomiting just nauseas.   It may from being anxious.    No COVID-19 exposures.    I work at [employer].   4. RECURRENT SYMPTOM: "Have you had nausea before?" If so, ask: "When was the last time?" "What happened that time?"     Red face, not sleeping,  burning in my shoulders nausea and my BP being elevated.     The pain from my shoulders goes straight across my back..    I had a defib put in in 2016 5. CAUSE: "What do you think is causing the nausea?"     Maybe anxiety or maybe my kidneys. 6. PREGNANCY: "Is there any chance you are pregnant?" (e.g., unprotected intercourse, missed birth control pill, broken condom)     N/A  Protocols used: NAUSEA-A-AH

## 2019-09-30 ENCOUNTER — Ambulatory Visit (INDEPENDENT_AMBULATORY_CARE_PROVIDER_SITE_OTHER): Payer: BC Managed Care – PPO | Admitting: Internal Medicine

## 2019-09-30 ENCOUNTER — Other Ambulatory Visit (INDEPENDENT_AMBULATORY_CARE_PROVIDER_SITE_OTHER): Payer: BC Managed Care – PPO

## 2019-09-30 ENCOUNTER — Encounter: Payer: Self-pay | Admitting: Internal Medicine

## 2019-09-30 ENCOUNTER — Other Ambulatory Visit: Payer: Self-pay

## 2019-09-30 VITALS — BP 186/92 | HR 91 | Temp 97.9°F | Ht 72.0 in | Wt 193.0 lb

## 2019-09-30 DIAGNOSIS — E538 Deficiency of other specified B group vitamins: Secondary | ICD-10-CM | POA: Diagnosis not present

## 2019-09-30 DIAGNOSIS — E611 Iron deficiency: Secondary | ICD-10-CM

## 2019-09-30 DIAGNOSIS — E559 Vitamin D deficiency, unspecified: Secondary | ICD-10-CM

## 2019-09-30 DIAGNOSIS — I10 Essential (primary) hypertension: Secondary | ICD-10-CM

## 2019-09-30 DIAGNOSIS — E785 Hyperlipidemia, unspecified: Secondary | ICD-10-CM | POA: Diagnosis not present

## 2019-09-30 DIAGNOSIS — R739 Hyperglycemia, unspecified: Secondary | ICD-10-CM | POA: Diagnosis not present

## 2019-09-30 DIAGNOSIS — F411 Generalized anxiety disorder: Secondary | ICD-10-CM

## 2019-09-30 LAB — VITAMIN B12: Vitamin B-12: 814 pg/mL (ref 211–911)

## 2019-09-30 LAB — BASIC METABOLIC PANEL
BUN: 9 mg/dL (ref 6–23)
CO2: 25 mEq/L (ref 19–32)
Calcium: 9.3 mg/dL (ref 8.4–10.5)
Chloride: 103 mEq/L (ref 96–112)
Creatinine, Ser: 0.79 mg/dL (ref 0.40–1.50)
GFR: 99.23 mL/min (ref >60.00)
Glucose, Bld: 148 mg/dL — ABNORMAL HIGH (ref 70–99)
Potassium: 4.1 mEq/L (ref 3.5–5.1)
Sodium: 135 mEq/L (ref 135–145)

## 2019-09-30 LAB — HEMOGLOBIN A1C: Hgb A1c MFr Bld: 6.2 % (ref 4.6–6.5)

## 2019-09-30 LAB — URINALYSIS, ROUTINE W REFLEX MICROSCOPIC
Bilirubin Urine: NEGATIVE
Hgb urine dipstick: NEGATIVE
Ketones, ur: NEGATIVE
Leukocytes,Ua: NEGATIVE
Nitrite: NEGATIVE
Specific Gravity, Urine: <=1.005 — AB (ref 1.000–1.030)
Total Protein, Urine: NEGATIVE
Urine Glucose: NEGATIVE
Urobilinogen, UA: 0.2 (ref 0.0–1.0)
WBC, UA: NONE SEEN — AB (ref 0–?)
pH: 6 (ref 5.0–8.0)

## 2019-09-30 LAB — LIPID PANEL
Cholesterol: 121 mg/dL (ref 0–200)
HDL: 38.9 mg/dL — ABNORMAL LOW (ref >39.00)
LDL Cholesterol: 63 mg/dL (ref 0–99)
NonHDL: 82.32
Total CHOL/HDL Ratio: 3
Triglycerides: 97 mg/dL (ref 0.0–149.0)
VLDL: 19.4 mg/dL (ref 0.0–40.0)

## 2019-09-30 LAB — CBC WITH DIFFERENTIAL/PLATELET
Basophils Absolute: 0 10*3/uL (ref 0.0–0.1)
Basophils Relative: 0.4 % (ref 0.0–3.0)
Eosinophils Absolute: 0.1 10*3/uL (ref 0.0–0.7)
Eosinophils Relative: 0.5 % (ref 0.0–5.0)
HCT: 47.8 % (ref 39.0–52.0)
Hemoglobin: 15.8 g/dL (ref 13.0–17.0)
Lymphocytes Relative: 15.4 % (ref 12.0–46.0)
Lymphs Abs: 1.7 10*3/uL (ref 0.7–4.0)
MCHC: 32.9 g/dL (ref 30.0–36.0)
MCV: 97.8 fl (ref 78.0–100.0)
Monocytes Absolute: 1.4 10*3/uL — ABNORMAL HIGH (ref 0.1–1.0)
Monocytes Relative: 12.6 % — ABNORMAL HIGH (ref 3.0–12.0)
Neutro Abs: 7.7 10*3/uL (ref 1.4–7.7)
Neutrophils Relative %: 71.1 % (ref 43.0–77.0)
Platelets: 264 10*3/uL (ref 150.0–400.0)
RBC: 4.89 Mil/uL (ref 4.22–5.81)
RDW: 14.1 % (ref 11.5–15.5)
WBC: 10.8 10*3/uL — ABNORMAL HIGH (ref 4.0–10.5)

## 2019-09-30 LAB — HEPATIC FUNCTION PANEL
ALT: 22 U/L (ref 0–53)
AST: 28 U/L (ref 0–37)
Albumin: 4.1 g/dL (ref 3.5–5.2)
Alkaline Phosphatase: 98 U/L (ref 39–117)
Bilirubin, Direct: 0.1 mg/dL (ref 0.0–0.3)
Total Bilirubin: 0.5 mg/dL (ref 0.2–1.2)
Total Protein: 7.6 g/dL (ref 6.0–8.3)

## 2019-09-30 LAB — IBC PANEL
Iron: 50 ug/dL (ref 42–165)
Saturation Ratios: 12.1 % — ABNORMAL LOW (ref 20.0–50.0)
Transferrin: 296 mg/dL (ref 212.0–360.0)

## 2019-09-30 LAB — VITAMIN D 25 HYDROXY (VIT D DEFICIENCY, FRACTURES): VITD: 30.2 ng/mL (ref 30.00–100.00)

## 2019-09-30 MED ORDER — ESZOPICLONE 2 MG PO TABS: 2.0000 mg | ORAL_TABLET | Freq: Every evening | ORAL | 5 refills | Status: DC | PRN

## 2019-09-30 MED ORDER — AMLODIPINE BESYLATE 5 MG PO TABS: 5.0000 mg | ORAL_TABLET | Freq: Every day | ORAL | 3 refills | Status: DC

## 2019-09-30 MED ORDER — CITALOPRAM HYDROBROMIDE 10 MG PO TABS: 10.0000 mg | ORAL_TABLET | Freq: Every day | ORAL | 3 refills | Status: DC

## 2019-09-30 MED ORDER — OLMESARTAN MEDOXOMIL-HCTZ 40-12.5 MG PO TABS: 1.0000 | ORAL_TABLET | Freq: Every day | ORAL | 3 refills | Status: DC

## 2019-09-30 NOTE — Progress Notes (Signed)
Subjective:    Patient ID: Daniel Richard, male    DOB: 12-03-1956, 63 y.o.   MRN: [id number]  HPI  Here to f/u with "feeling red in the face" and bought a new BP med cuff and realized BP is quite high.  He is hoping this is related to stress only.Pt denies chest pain, increased sob or doe, wheezing, orthopnea, PND, increased LE swelling, palpitations, dizziness or syncope.  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 Daughter is pregnant as well.  Did see Dr Caryl Comes in July with similar BP as to today, BP med was changed to verapamil but pt went back to amlodipine due to dizziness with other.  Also could not take the coreg due to somnolence with higher dose 25 mg.  Past Medical History:  Diagnosis Date  . AICD (automatic cardioverter/defibrillator) present    ST JUDE  . ALLERGIC RHINITIS   . ANXIETY   . Arthritis    IN FINGERS  . Cardiac arrest ([place]) 11/21/2015  . Cardiac device in situ   . HYPERLIPIDEMIA   . HYPERTENSION   . LOW BACK PAIN   . Myocardial infarction ([place]) 11/21/2015  . Paroxysmal atrial fibrillation [facility])    Past Surgical History:  Procedure Laterality Date  . CARDIAC CATHETERIZATION N/A 11/21/2015   Procedure: Left Heart Cath and Coronary Angiography;  Surgeon: Wellington Hampshire, MD;  Location: [place] [facility];  Service: Cardiovascular;  Laterality: N/A;  . EP IMPLANTABLE DEVICE N/A 11/29/2015   Procedure: ICD Implant;  Surgeon: Deboraha Sprang, MD;  Location: [place] [facility];  Service:  Cardiovascular;  Laterality: N/A;  . INGUINAL HERNIA REPAIR Left 02/09/2017   Procedure: OPEN REPAIR LEFT INGUINAL HERNIA ;  Surgeon: Greer Pickerel, MD;  [facility];  Service: General;  Laterality: Left;  . INSERTION OF MESH Left 02/09/2017   Procedure: INSERTION OF MESH;  Surgeon: Greer Pickerel, MD;  [facility];  Service: General;  Laterality: Left;    reports that he quit smoking about 11 years ago. His smoking use included cigarettes. He has a 20.00 pack-year smoking history. He has never used smokeless tobacco. He reports that he does not drink alcohol or use drugs. family history includes Hypertension in an other family member. Allergies  Allergen Reactions  . Lipitor [Atorvastatin]     myalgia  . Meperidine Hcl Other (See Comments)    "TURNED WHITE AS A SHEEP, SWEATY COULD NOT STAND" DEMEROL   Current Outpatient Medications on File Prior to Visit  Medication Sig Dispense Refill  . apixaban (ELIQUIS) 5 MG TABS tablet TAKE 1 TABLET (5 MG TOTAL) BY MOUTH 2 (TWO) TIMES DAILY. 180 tablet 3  . b complex vitamins tablet Take 1 tablet by mouth daily.    . carvedilol (COREG) 25 MG tablet Take 0.5 tablets (12.5 mg total) by mouth 2 (two) times daily with a meal. 90 tablet 3  . ibuprofen (ADVIL,MOTRIN) 200 MG tablet Take 200 mg by mouth every 6 (  six) hours as needed (Pain).    . Multiple Vitamins-Minerals (MULTIVITAMIN WITH MINERALS) tablet Take 1 tablet by mouth daily.    . rosuvastatin (CRESTOR) 40 MG tablet Take 40 mg by mouth daily.     No current facility-administered medications on file prior to visit.    Review of Systems  Constitutional: Negative for other unusual diaphoresis or sweats HENT: Negative for ear discharge or swelling Eyes: Negative for other worsening visual disturbances Respiratory: Negative for stridor or other swelling  Gastrointestinal: Negative for worsening distension or other blood Genitourinary: Negative for retention or other urinary change  Musculoskeletal: Negative for other MSK pain or swelling Skin: Negative for color change or other new lesions Neurological: Negative for worsening tremors and other numbness  Psychiatric/Behavioral: Negative for worsening agitation or other fatigue All otherwise neg per pt     Objective:   Physical Exam BP (!) 186/92 (BP Location: Left Arm, Patient Position: Sitting, Cuff Size: Large)   Pulse 91   Temp 97.9 F (36.6 C) (Oral)   Ht 6' (1.829 m)   Wt 193 lb (87.5 kg)   SpO2 98%   BMI 26.18 kg/m  VS noted,  Constitutional: Pt appears in NAD HENT: Head: NCAT.  Right Ear: External ear normal.  Left Ear: External ear normal.  Eyes: . Pupils are equal, round, and reactive to light. Conjunctivae and EOM are normal Nose: without d/c or deformity Neck: Neck supple. Gross normal ROM Cardiovascular: Normal rate and regular rhythm.   Pulmonary/Chest: Effort normal and breath sounds without rales or wheezing.  Abd:  Soft, NT, ND, + BS, no organomegaly Neurological: Pt is alert. At baseline orientation, motor grossly intact Skin: Skin is warm. No rashes, other new lesions, no LE edema Psychiatric: Pt behavior is normal without agitation  All otherwise neg per pt  Lab Results  Component Value Date   WBC 10.8 (H) 09/30/2019   HGB 15.8 09/30/2019   HCT 47.8 09/30/2019   PLT 264.0 09/30/2019   GLUCOSE 148 (H) 09/30/2019   CHOL 121 09/30/2019   TRIG 97.0 09/30/2019   HDL 38.90 (L) 09/30/2019   LDLDIRECT 108.0 03/02/2016   LDLCALC 63 09/30/2019   ALT 22 09/30/2019   AST 28 09/30/2019   NA 135 09/30/2019   K 4.1 09/30/2019   CL 103 09/30/2019   CREATININE 0.79 09/30/2019   BUN 9 09/30/2019   CO2 25 09/30/2019   TSH 1.70 02/06/2019   PSA 0.39 02/06/2019   INR 0.99 02/09/2017   HGBA1C 6.2 09/30/2019      Assessment & Plan:

## 2019-09-30 NOTE — Patient Instructions (Addendum)
Please take all new medication as prescribed - the benicar HCT  Please continue all other medications as before, including the coreg and amlodopine  After a few days of these meds, Please take all new medication as prescribed - the celexa 10 qd  Also, Please take all new medication as prescribed - the lunesta 2 mg at bedtime only if needed  Please have the pharmacy call with any other refills you may need.  Please continue your efforts at being more active, low cholesterol diet, and weight control  Please keep your appointments with your specialists as you may have planned  Please go to the LAB in the Basement (turn left off the elevator) for the tests to be done today  You will be contacted by phone if any changes need to be made immediately.  Otherwise, you will receive a letter about your results with an explanation, but please check with MyChart first.  Please remember to sign up for MyChart if you have not done so, as this will be important to you in the future with finding out test results, communicating by private email, and scheduling acute appointments online when needed.  Please return in 6 days or sooner if needed

## 2019-10-05 ENCOUNTER — Encounter: Payer: Self-pay | Admitting: Internal Medicine

## 2019-10-05 NOTE — Assessment & Plan Note (Signed)
Uncontrolled, to cong coreg 12.5 bid and amlodipine, start benicar HCT, for labs as ordered today, call in 1 wk with BP at home

## 2019-10-05 NOTE — Assessment & Plan Note (Signed)
stable overall by history and exam, recent data reviewed with pt, and pt to continue medical treatment as before,  to f/u any worsening symptoms or concerns  

## 2019-10-05 NOTE — Assessment & Plan Note (Signed)
With sleep difficulty worsening  - for lunesta 2 qhs prn, and start celexa 10 qd

## 2019-10-06 ENCOUNTER — Encounter: Payer: Self-pay | Admitting: Internal Medicine

## 2019-10-06 ENCOUNTER — Other Ambulatory Visit: Payer: Self-pay

## 2019-10-06 ENCOUNTER — Ambulatory Visit (INDEPENDENT_AMBULATORY_CARE_PROVIDER_SITE_OTHER): Payer: BC Managed Care – PPO | Admitting: Internal Medicine

## 2019-10-06 DIAGNOSIS — I10 Essential (primary) hypertension: Secondary | ICD-10-CM

## 2019-10-06 DIAGNOSIS — E785 Hyperlipidemia, unspecified: Secondary | ICD-10-CM | POA: Diagnosis not present

## 2019-10-06 DIAGNOSIS — G4700 Insomnia, unspecified: Secondary | ICD-10-CM | POA: Diagnosis not present

## 2019-10-06 DIAGNOSIS — F411 Generalized anxiety disorder: Secondary | ICD-10-CM | POA: Diagnosis not present

## 2019-10-06 NOTE — Progress Notes (Signed)
Subjective:    Patient ID: Daniel Richard, male    DOB: 27-Nov-1956, 63 y.o.   MRN: [id number]  HPI Here to f/u; overall doing ok,  Pt denies chest pain, increasing sob or doe, wheezing, orthopnea, PND, increased LE swelling, palpitations, dizziness or syncope.  Pt denies new neurological symptoms such as new headache, or facial or extremity weakness or numbness.  Pt denies polydipsia, polyuria, or low sugar episode.  Pt states overall good compliance with meds, mostly trying to follow appropriate diet, with wt overall stable, Wt Readings from Last 3 Encounters:  10/06/19 182 lb (82.6 kg)  09/30/19 193 lb (87.5 kg)  06/18/19 183 lb 3.2 oz (83.1 kg)  Also having increased difficulty with getting to sleep most nights for several months,  Denies worsening depressive symptoms, suicidal ideation, or panic; has ongoing anxiety, asks for tx Past Medical History:  Diagnosis Date   AICD (automatic cardioverter/defibrillator) present    [place]    Arthritis    IN FINGERS   Cardiac arrest ([place]) 11/21/2015   Cardiac device in situ    HYPERLIPIDEMIA    HYPERTENSION    LOW BACK PAIN    Myocardial infarction ([place]) 11/21/2015   Paroxysmal atrial fibrillation ([facility])    Past Surgical History:  Procedure Laterality Date   CARDIAC CATHETERIZATION N/A 11/21/2015   Procedure: Left Heart Cath and Coronary Angiography;  Surgeon: Wellington Hampshire, MD;  Location: [facility];  Service: Cardiovascular;  Laterality: N/A;   EP IMPLANTABLE DEVICE N/A 11/29/2015   Procedure: ICD Implant;  Surgeon: Deboraha Sprang, MD;  Location: [facility];  Service: Cardiovascular;  Laterality: N/A;   INGUINAL HERNIA REPAIR Left 02/09/2017   Procedure: OPEN REPAIR LEFT INGUINAL HERNIA ;  Surgeon: Greer Pickerel, MD;  [facility];  Service: General;  Laterality: Left;   INSERTION OF MESH Left 02/09/2017   Procedure: INSERTION OF MESH;  Surgeon: Greer Pickerel, MD;   Location: [facility];  Service: General;  Laterality: Left;    reports that he quit smoking about 11 years ago. His smoking use included cigarettes. He has a 20.00 pack-year smoking history. He has never used smokeless tobacco. He reports that he does not drink alcohol or use drugs. family history includes Hypertension in an other family member. Allergies  Allergen Reactions   Lipitor [Atorvastatin]     myalgia   Meperidine Hcl Other (See Comments)    "TURNED WHITE AS A SHEEP, SWEATY COULD NOT STAND" DEMEROL   Current Outpatient Medications on File Prior to Visit  Medication Sig Dispense Refill   amLODipine (NORVASC) 5 MG tablet Take 1 tablet (5 mg total) by mouth daily. 90 tablet 3   apixaban (ELIQUIS) 5 MG TABS tablet TAKE 1 TABLET (5 MG TOTAL) BY MOUTH 2 (TWO) TIMES DAILY. 180 tablet 3   b complex vitamins tablet Take 1 tablet by mouth daily.     carvedilol (COREG) 25 MG tablet Take 0.5 tablets (12.5 mg total) by mouth 2 (two) times daily with a meal. 90 tablet 3   citalopram (CELEXA) 10 MG tablet Take 1 tablet (10 mg total) by mouth daily. 90 tablet 3   eszopiclone (LUNESTA) 2 MG TABS tablet Take 1 tablet (2 mg total) by mouth at bedtime as needed for sleep. Take immediately before bedtime 30 tablet 5   ibuprofen (ADVIL,MOTRIN) 200 MG tablet Take 200 mg by mouth every 6 (six) hours as needed (Pain).  Multiple Vitamins-Minerals (MULTIVITAMIN WITH MINERALS) tablet Take 1 tablet by mouth daily.     olmesartan-hydrochlorothiazide (BENICAR HCT) 40-12.5 MG tablet Take 1 tablet by mouth daily. 90 tablet 3   rosuvastatin (CRESTOR) 40 MG tablet Take 40 mg by mouth daily.     No current facility-administered medications on file prior to visit.    Review of Systems  Constitutional: Negative for other unusual diaphoresis or sweats HENT: Negative for ear discharge or swelling Eyes: Negative for other worsening visual disturbances Respiratory: Negative for stridor or other swelling    Gastrointestinal: Negative for worsening distension or other blood Genitourinary: Negative for retention or other urinary change Musculoskeletal: Negative for other MSK pain or swelling Skin: Negative for color change or other new lesions Neurological: Negative for worsening tremors and other numbness  Psychiatric/Behavioral: Negative for worsening agitation or other fatigue All otherwise neg per pt     Objective:   Physical Exam BP 126/84    Pulse 94    Temp 98.3 F (36.8 C) (Oral)    Ht 6' (1.829 m)    Wt 182 lb (82.6 kg)    SpO2 98%    BMI 24.68 kg/m  .VS noted,  Constitutional: Pt appears in NAD HENT: Head: NCAT.  Right Ear: External ear normal.  Left Ear: External ear normal.  Eyes: . Pupils are equal, round, and reactive to light. Conjunctivae and EOM are normal Nose: without d/c or deformity Neck: Neck supple. Gross normal ROM Cardiovascular: Normal rate and regular rhythm.   Pulmonary/Chest: Effort normal and breath sounds without rales or wheezing.  Abd:  Soft, NT, ND, + BS, no organomegaly Neurological: Pt is alert. At baseline orientation, motor grossly intact Skin: Skin is warm. No rashes, other new lesions, no LE edema Psychiatric: Pt behavior is normal without agitation but 2+ nervous All otherwise neg per pt Lab Results  Component Value Date   WBC 10.8 (H) 09/30/2019   HGB 15.8 09/30/2019   HCT 47.8 09/30/2019   PLT 264.0 09/30/2019   GLUCOSE 148 (H) 09/30/2019   CHOL 121 09/30/2019   TRIG 97.0 09/30/2019   HDL 38.90 (L) 09/30/2019   LDLDIRECT 108.0 03/02/2016   LDLCALC 63 09/30/2019   ALT 22 09/30/2019   AST 28 09/30/2019   NA 135 09/30/2019   K 4.1 09/30/2019   CL 103 09/30/2019   CREATININE 0.79 09/30/2019   BUN 9 09/30/2019   CO2 25 09/30/2019   TSH 1.70 02/06/2019   PSA 0.39 02/06/2019   INR 0.99 02/09/2017   HGBA1C 6.2 09/30/2019         Assessment & Plan:

## 2019-10-11 ENCOUNTER — Encounter: Payer: Self-pay | Admitting: Internal Medicine

## 2019-10-11 DIAGNOSIS — G4700 Insomnia, unspecified: Secondary | ICD-10-CM | POA: Insufficient documentation

## 2019-10-11 NOTE — Assessment & Plan Note (Signed)
Ok for [facility] 2 mg qhs prn,  to f/u any worsening symptoms or concerns

## 2019-10-11 NOTE — Assessment & Plan Note (Signed)
Mild to mod, for celexa 10 qd,  to f/u any worsening symptoms or concerns 

## 2019-10-11 NOTE — Patient Instructions (Signed)
Please take all new medication as prescribed  Please continue all other medications as before, and refills have been done if requested.  Please have the pharmacy call with any other refills you may need.  Please continue your efforts at being more active, low cholesterol diet, and weight control.  Please keep your appointments with your specialists as you may have planned    

## 2019-10-11 NOTE — Assessment & Plan Note (Signed)
stable overall by history and exam, recent data reviewed with pt, and pt to continue medical treatment as before,  to f/u any worsening symptoms or concerns  

## 2019-10-11 NOTE — Assessment & Plan Note (Signed)
Improved, stable overall by history and exam, recent data reviewed with pt, and pt to continue medical treatment as before,  to f/u any worsening symptoms or concerns  

## 2019-12-11 ENCOUNTER — Ambulatory Visit (INDEPENDENT_AMBULATORY_CARE_PROVIDER_SITE_OTHER): Payer: BC Managed Care – PPO | Admitting: *Deleted

## 2019-12-11 DIAGNOSIS — I469 Cardiac arrest, cause unspecified: Secondary | ICD-10-CM

## 2019-12-11 LAB — CUP PACEART REMOTE DEVICE CHECK
Battery Remaining Longevity: 56 mo
Battery Remaining Percentage: 55 %
Battery Voltage: 2.98 V
Brady Statistic RV Percent Paced: 1 %
Date Time Interrogation Session: 20201231140216
HighPow Impedance: 55 Ohm
HighPow Impedance: 55 Ohm
Implantable Lead Implant Date: 20161219
Implantable Lead Location: 753860
Implantable Pulse Generator Implant Date: 20161219
Lead Channel Impedance Value: 440 Ohm
Lead Channel Pacing Threshold Amplitude: 1.25 V
Lead Channel Pacing Threshold Pulse Width: 0.6 ms
Lead Channel Sensing Intrinsic Amplitude: 10.5 mV
Lead Channel Setting Pacing Amplitude: 2.5 V
Lead Channel Setting Pacing Pulse Width: 0.5 ms
Lead Channel Setting Sensing Sensitivity: 0.5 mV
Pulse Gen Serial Number: 7219005

## 2019-12-12 NOTE — Progress Notes (Signed)
ICD remote 

## 2019-12-17 ENCOUNTER — Ambulatory Visit: Payer: BC Managed Care – PPO | Admitting: Internal Medicine

## 2019-12-17 ENCOUNTER — Other Ambulatory Visit: Payer: Self-pay

## 2019-12-17 ENCOUNTER — Encounter: Payer: Self-pay | Admitting: Internal Medicine

## 2019-12-17 DIAGNOSIS — I10 Essential (primary) hypertension: Secondary | ICD-10-CM

## 2019-12-17 DIAGNOSIS — F411 Generalized anxiety disorder: Secondary | ICD-10-CM

## 2019-12-17 DIAGNOSIS — R1032 Left lower quadrant pain: Secondary | ICD-10-CM

## 2019-12-17 LAB — URINALYSIS, ROUTINE W REFLEX MICROSCOPIC
Bilirubin Urine: NEGATIVE
Hgb urine dipstick: NEGATIVE
Ketones, ur: NEGATIVE
Leukocytes,Ua: NEGATIVE
Nitrite: NEGATIVE
RBC / HPF: NONE SEEN (ref 0–?)
Specific Gravity, Urine: 1.01 (ref 1.000–1.030)
Total Protein, Urine: NEGATIVE
Urine Glucose: NEGATIVE
Urobilinogen, UA: 0.2 (ref 0.0–1.0)
pH: 6.5 (ref 5.0–8.0)

## 2019-12-17 NOTE — Progress Notes (Signed)
Subjective:    Patient ID: Daniel Richard, male    DOB: 05-26-1956, 64 y.o.   MRN: [id number]  HPI  Here with 2 days onset left sided abd pain, wore more to the LLQ, after twisting at the waist to pull up a heavy garage door; Pt denies chest pain, increased sob or doe, wheezing, orthopnea, PND, increased LE swelling, palpitations, dizziness or syncope.  Pt denies new neurological symptoms such as new headache, or facial or extremity weakness or numbness   Pt denies polydipsia, polyuria.  Denies worsening reflux, dysphagia, n/v, bowel change or blood.   Pt denies fever, wt loss, night sweats, loss of appetite, or other constitutional symptoms  Denies urinary symptoms such as dysuria, frequency, urgency, flank pain, hematuria or n/v, fever, chills.  Denies worsening depressive symptoms, suicidal ideation, or panic Past Medical History:  Diagnosis Date  . AICD (automatic cardioverter/defibrillator) present    [place]  . ALLERGIC RHINITIS   . ANXIETY   . Arthritis    IN FINGERS  . Cardiac arrest ([place]) 11/21/2015  . Cardiac device in situ   . HYPERLIPIDEMIA   . HYPERTENSION   . LOW BACK PAIN   . Myocardial infarction ([place]) 11/21/2015  . Paroxysmal atrial fibrillation [facility])    Past Surgical History:  Procedure Laterality Date  . CARDIAC CATHETERIZATION N/A 11/21/2015   Procedure: Left Heart Cath and Coronary Angiography;  Surgeon: Wellington Hampshire, MD;  Location: [facility];  Service: Cardiovascular;  Laterality: N/A;  . EP IMPLANTABLE DEVICE N/A 11/29/2015   Procedure: ICD Implant;  Surgeon: Deboraha Sprang, MD;  Location: [place] [facility];  Service: Cardiovascular;  Laterality: N/A;  . INGUINAL HERNIA REPAIR Left 02/09/2017   Procedure: OPEN REPAIR LEFT INGUINAL HERNIA ;  Surgeon: Greer Pickerel, MD;  [facility];  Service: General;  Laterality: Left;  . INSERTION OF MESH Left 02/09/2017   Procedure: INSERTION OF MESH;  Surgeon: Greer Pickerel, MD;  [facility];  Service:  General;  Laterality: Left;    reports that he quit smoking about 12 years ago. His smoking use included cigarettes. He has a 20.00 pack-year smoking history. He has never used smokeless tobacco. He reports that he does not drink alcohol or use drugs. family history includes Hypertension in an other family member. Allergies  Allergen Reactions  . Lipitor [Atorvastatin]     myalgia  . Meperidine Hcl Other (See Comments)    "TURNED WHITE AS A SHEEP, SWEATY COULD NOT STAND" DEMEROL   Current Outpatient Medications on File Prior to Visit  Medication Sig Dispense Refill  . amLODipine (NORVASC) 5 MG tablet Take 1 tablet (5 mg total) by mouth daily. 90 tablet 3  . apixaban (ELIQUIS) 5 MG TABS tablet TAKE 1 TABLET (5 MG TOTAL) BY MOUTH 2 (TWO) TIMES DAILY. 180 tablet 3  . b complex vitamins tablet Take 1 tablet by mouth daily.    . carvedilol (COREG) 25 MG tablet Take 0.5 tablets (12.5 mg total) by mouth 2 (two) times daily with a meal. 90 tablet 3  . citalopram (CELEXA) 10 MG tablet Take 1 tablet (10 mg total) by mouth daily. 90 tablet 3  . eszopiclone (LUNESTA) 2 MG TABS tablet Take 1 tablet (2 mg total) by mouth at bedtime as needed for sleep. Take immediately before bedtime 30 tablet 5  . ibuprofen (ADVIL,MOTRIN) 200 MG tablet Take 200 mg by mouth every 6 (six) hours as needed (Pain).    . Multiple  Vitamins-Minerals (MULTIVITAMIN WITH MINERALS) tablet Take 1 tablet by mouth daily.    Marland Kitchen olmesartan-hydrochlorothiazide (BENICAR HCT) 40-12.5 MG tablet Take 1 tablet by mouth daily. 90 tablet 3  . rosuvastatin (CRESTOR) 40 MG tablet Take 40 mg by mouth daily.     No current facility-administered medications on file prior to visit.   Review of Systems  Constitutional: Negative for other unusual diaphoresis or sweats HENT: Negative for ear discharge or swelling Eyes: Negative for other worsening visual disturbances Respiratory: Negative for stridor or other swelling  Gastrointestinal: Negative for  worsening distension or other blood Genitourinary: Negative for retention or other urinary change Musculoskeletal: Negative for other MSK pain or swelling Skin: Negative for color change or other new lesions Neurological: Negative for worsening tremors and other numbness  Psychiatric/Behavioral: Negative for worsening agitation or other fatigue All otherwise neg per pt     Objective:   Physical Exam BP (!) 172/99   Pulse (!) 110   Temp 98.2 F (36.8 C)   Resp 12   Ht 6' (1.829 m)   Wt 186 lb (84.4 kg)   SpO2 97%   BMI 25.23 kg/m  VS noted,  Constitutional: Pt appears in NAD HENT: Head: NCAT.  Right Ear: External ear normal.  Left Ear: External ear normal.  Eyes: . Pupils are equal, round, and reactive to light. Conjunctivae and EOM are normal Nose: without d/c or deformity Neck: Neck supple. Gross normal ROM Cardiovascular: Normal rate and regular rhythm.   Pulmonary/Chest: Effort normal and breath sounds without rales or wheezing.  Abd:  Soft, ND, + BS, no organomegaly and mild LLQ tender without guarding or rebound Neurological: Pt is alert. At baseline orientation, motor grossly intact Skin: Skin is warm. No rashes, other new lesions, no LE edema Psychiatric: Pt behavior is normal without agitation  All otherwise neg per pt Lab Results  Component Value Date   WBC 10.8 (H) 09/30/2019   HGB 15.8 09/30/2019   HCT 47.8 09/30/2019   PLT 264.0 09/30/2019   GLUCOSE 148 (H) 09/30/2019   CHOL 121 09/30/2019   TRIG 97.0 09/30/2019   HDL 38.90 (L) 09/30/2019   LDLDIRECT 108.0 03/02/2016   LDLCALC 63 09/30/2019   ALT 22 09/30/2019   AST 28 09/30/2019   NA 135 09/30/2019   K 4.1 09/30/2019   CL 103 09/30/2019   CREATININE 0.79 09/30/2019   BUN 9 09/30/2019   CO2 25 09/30/2019   TSH 1.70 02/06/2019   PSA 0.39 02/06/2019   INR 0.99 02/09/2017   HGBA1C 6.2 09/30/2019      Assessment & Plan:

## 2019-12-17 NOTE — Patient Instructions (Signed)
Please continue all other medications as before, and refills have been done if requested.  Please have the pharmacy call with any other refills you may need.  Please continue your efforts at being more active, low cholesterol diet, and weight control.  You are otherwise up to date with prevention measures today.  Please keep your appointments with your specialists as you may have planned  Please go to the LAB at the blood drawing area for the tests to be done - just the urine test today  You will be contacted by phone if any changes need to be made immediately.  Otherwise, you will receive a letter about your results with an explanation, but please check with MyChart first.  Please remember to sign up for MyChart if you have not done so, as this will be important to you in the future with finding out test results, communicating by private email, and scheduling acute appointments online when needed.

## 2019-12-21 ENCOUNTER — Encounter: Payer: Self-pay | Admitting: Internal Medicine

## 2019-12-21 NOTE — Assessment & Plan Note (Signed)
stable overall by history and exam, recent data reviewed with pt, and pt to continue medical treatment as before,  to f/u any worsening symptoms or concerns  

## 2019-12-21 NOTE — Assessment & Plan Note (Signed)
Exam benign except for mild tenderness, c/w msk strain, for UA and  to f/u any worsening symptoms or concerns

## 2020-02-11 ENCOUNTER — Other Ambulatory Visit: Payer: Self-pay

## 2020-02-11 MED ORDER — CARVEDILOL 25 MG PO TABS: 12.5000 mg | ORAL_TABLET | Freq: Two times a day (BID) | ORAL | 1 refills | Status: DC

## 2020-03-09 ENCOUNTER — Other Ambulatory Visit: Payer: Self-pay | Admitting: Internal Medicine

## 2020-03-15 ENCOUNTER — Ambulatory Visit: Payer: BLUE CROSS/BLUE SHIELD | Admitting: Internal Medicine

## 2020-04-12 ENCOUNTER — Other Ambulatory Visit: Payer: Self-pay | Admitting: Internal Medicine

## 2020-04-19 ENCOUNTER — Ambulatory Visit (INDEPENDENT_AMBULATORY_CARE_PROVIDER_SITE_OTHER): Payer: BC Managed Care – PPO | Admitting: *Deleted

## 2020-04-19 DIAGNOSIS — I469 Cardiac arrest, cause unspecified: Secondary | ICD-10-CM | POA: Diagnosis not present

## 2020-04-19 DIAGNOSIS — I480 Paroxysmal atrial fibrillation: Secondary | ICD-10-CM

## 2020-04-19 LAB — CUP PACEART REMOTE DEVICE CHECK
Battery Remaining Longevity: 53 mo
Battery Remaining Percentage: 52 %
Battery Voltage: 2.96 V
Brady Statistic RV Percent Paced: 1 %
Date Time Interrogation Session: 20210509120932
HighPow Impedance: 48 Ohm
HighPow Impedance: 48 Ohm
Implantable Lead Implant Date: 20161219
Implantable Lead Location: 753860
Implantable Pulse Generator Implant Date: 20161219
Lead Channel Impedance Value: 360 Ohm
Lead Channel Pacing Threshold Amplitude: 1.25 V
Lead Channel Pacing Threshold Pulse Width: 0.6 ms
Lead Channel Sensing Intrinsic Amplitude: 9.7 mV
Lead Channel Setting Pacing Amplitude: 2.5 V
Lead Channel Setting Pacing Pulse Width: 0.5 ms
Lead Channel Setting Sensing Sensitivity: 0.5 mV
Pulse Gen Serial Number: 7219005

## 2020-04-20 NOTE — Progress Notes (Signed)
Remote ICD transmission.   

## 2020-08-07 ENCOUNTER — Other Ambulatory Visit: Payer: Self-pay | Admitting: Internal Medicine

## 2020-08-08 NOTE — Telephone Encounter (Signed)
Please refill as per office routine med refill policy (all routine meds refilled for 3 mo or monthly per pt preference up to one year from last visit, then month to month grace period for 3 mo, then further med refills will have to be denied)  

## 2020-09-18 ENCOUNTER — Other Ambulatory Visit: Payer: Self-pay | Admitting: Internal Medicine

## 2020-09-18 NOTE — Telephone Encounter (Signed)
Please refill as per office routine med refill policy (all routine meds refilled for 3 mo or monthly per pt preference up to one year from last visit, then month to month grace period for 3 mo, then further med refills will have to be denied)  

## 2020-10-02 DIAGNOSIS — Z20822 Contact with and (suspected) exposure to covid-19: Secondary | ICD-10-CM | POA: Diagnosis not present

## 2020-10-02 DIAGNOSIS — R051 Acute cough: Secondary | ICD-10-CM | POA: Diagnosis not present

## 2020-10-13 ENCOUNTER — Encounter: Payer: Self-pay | Admitting: Internal Medicine

## 2020-10-13 ENCOUNTER — Other Ambulatory Visit: Payer: Self-pay

## 2020-10-13 ENCOUNTER — Ambulatory Visit: Payer: BC Managed Care – PPO | Admitting: Internal Medicine

## 2020-10-13 VITALS — BP 150/90 | HR 123 | Temp 98.5°F | Ht 72.0 in | Wt 177.0 lb

## 2020-10-13 DIAGNOSIS — I4891 Unspecified atrial fibrillation: Secondary | ICD-10-CM | POA: Insufficient documentation

## 2020-10-13 DIAGNOSIS — Z0000 Encounter for general adult medical examination without abnormal findings: Secondary | ICD-10-CM | POA: Diagnosis not present

## 2020-10-13 DIAGNOSIS — E559 Vitamin D deficiency, unspecified: Secondary | ICD-10-CM | POA: Diagnosis not present

## 2020-10-13 DIAGNOSIS — E785 Hyperlipidemia, unspecified: Secondary | ICD-10-CM

## 2020-10-13 DIAGNOSIS — E538 Deficiency of other specified B group vitamins: Secondary | ICD-10-CM | POA: Diagnosis not present

## 2020-10-13 DIAGNOSIS — I10 Essential (primary) hypertension: Secondary | ICD-10-CM | POA: Diagnosis not present

## 2020-10-13 DIAGNOSIS — Z0001 Encounter for general adult medical examination with abnormal findings: Secondary | ICD-10-CM

## 2020-10-13 DIAGNOSIS — F411 Generalized anxiety disorder: Secondary | ICD-10-CM

## 2020-10-13 LAB — LIPID PANEL
Cholesterol: 124 mg/dL (ref 0–200)
HDL: 37.1 mg/dL — ABNORMAL LOW (ref >39.00)
LDL Cholesterol: 74 mg/dL (ref 0–99)
NonHDL: 86.53
Total CHOL/HDL Ratio: 3
Triglycerides: 64 mg/dL (ref 0.0–149.0)
VLDL: 12.8 mg/dL (ref 0.0–40.0)

## 2020-10-13 LAB — URINALYSIS, ROUTINE W REFLEX MICROSCOPIC
Bilirubin Urine: NEGATIVE
Hgb urine dipstick: NEGATIVE
Ketones, ur: NEGATIVE
Leukocytes,Ua: NEGATIVE
Nitrite: NEGATIVE
RBC / HPF: NONE SEEN (ref 0–?)
Specific Gravity, Urine: <=1.005 — AB (ref 1.000–1.030)
Total Protein, Urine: NEGATIVE
Urine Glucose: NEGATIVE
Urobilinogen, UA: 0.2 (ref 0.0–1.0)
pH: 6 (ref 5.0–8.0)

## 2020-10-13 LAB — HEPATIC FUNCTION PANEL
ALT: 50 U/L (ref 0–53)
AST: 44 U/L — ABNORMAL HIGH (ref 0–37)
Albumin: 3.5 g/dL (ref 3.5–5.2)
Alkaline Phosphatase: 203 U/L — ABNORMAL HIGH (ref 39–117)
Bilirubin, Direct: 0.1 mg/dL (ref 0.0–0.3)
Total Bilirubin: 0.6 mg/dL (ref 0.2–1.2)
Total Protein: 7.3 g/dL (ref 6.0–8.3)

## 2020-10-13 LAB — BASIC METABOLIC PANEL
BUN: 8 mg/dL (ref 6–23)
CO2: 27 mEq/L (ref 19–32)
Calcium: 9 mg/dL (ref 8.4–10.5)
Chloride: 99 mEq/L (ref 96–112)
Creatinine, Ser: 0.71 mg/dL (ref 0.40–1.50)
GFR: 97.64 mL/min (ref >60.00)
Glucose, Bld: 87 mg/dL (ref 70–99)
Potassium: 4.4 mEq/L (ref 3.5–5.1)
Sodium: 135 mEq/L (ref 135–145)

## 2020-10-13 LAB — CBC WITH DIFFERENTIAL/PLATELET
Basophils Absolute: 0.1 10*3/uL (ref 0.0–0.1)
Basophils Relative: 0.8 % (ref 0.0–3.0)
Eosinophils Absolute: 0.1 10*3/uL (ref 0.0–0.7)
Eosinophils Relative: 0.9 % (ref 0.0–5.0)
HCT: 40 % (ref 39.0–52.0)
Hemoglobin: 13.5 g/dL (ref 13.0–17.0)
Lymphocytes Relative: 17 % (ref 12.0–46.0)
Lymphs Abs: 2 10*3/uL (ref 0.7–4.0)
MCHC: 33.8 g/dL (ref 30.0–36.0)
MCV: 92.7 fl (ref 78.0–100.0)
Monocytes Absolute: 1.2 10*3/uL — ABNORMAL HIGH (ref 0.1–1.0)
Monocytes Relative: 9.9 % (ref 3.0–12.0)
Neutro Abs: 8.5 10*3/uL — ABNORMAL HIGH (ref 1.4–7.7)
Neutrophils Relative %: 71.4 % (ref 43.0–77.0)
Platelets: 427 10*3/uL — ABNORMAL HIGH (ref 150.0–400.0)
RBC: 4.31 Mil/uL (ref 4.22–5.81)
RDW: 13.1 % (ref 11.5–15.5)
WBC: 11.8 10*3/uL — ABNORMAL HIGH (ref 4.0–10.5)

## 2020-10-13 LAB — VITAMIN B12: Vitamin B-12: 925 pg/mL — ABNORMAL HIGH (ref 211–911)

## 2020-10-13 LAB — TSH: TSH: 0.94 u[IU]/mL (ref 0.35–4.50)

## 2020-10-13 LAB — PSA: PSA: 0.62 ng/mL (ref 0.10–4.00)

## 2020-10-13 LAB — VITAMIN D 25 HYDROXY (VIT D DEFICIENCY, FRACTURES): VITD: 41.85 ng/mL (ref 30.00–100.00)

## 2020-10-13 MED ORDER — DILTIAZEM HCL ER COATED BEADS 180 MG PO CP24: 180.0000 mg | ORAL_CAPSULE | Freq: Every day | ORAL | 3 refills | Status: AC

## 2020-10-13 NOTE — Progress Notes (Signed)
Subjective:    Patient ID: Daniel Richard, male    DOB: [birth year], 64 y.o.   MRN: [id number]  HPI  Here for wellness and f/u;  Overall doing ok;  Pt denies Chest pain, worsening SOB, DOE, wheezing, orthopnea, PND, worsening LE edema, palpitations, dizziness or syncope.  Pt denies neurological change such as new headache, facial or extremity weakness.  Pt denies polydipsia, polyuria, or low sugar symptoms. Pt states overall good compliance with treatment and medications, good tolerability, and has been trying to follow appropriate diet.  Pt denies worsening depressive symptoms, suicidal ideation or panic. No fever, night sweats, wt loss, loss of appetite, or other constitutional symptoms.  Pt states good ability with ADL's, has low fall risk, home safety reviewed and adequate, no other significant changes in hearing or vision, and only occasionally active with exercise.  Wt down with better diet. Wt Readings from Last 3 Encounters:  10/13/20 177 lb (80.3 kg)  12/17/19 186 lb (84.4 kg)  10/06/19 182 lb (82.6 kg)   BP Readings from Last 3 Encounters:  10/13/20 (!) 150/90  12/17/19 (!) 172/99  10/06/19 126/84  Did try the antidepressant but could not tolerate after 3 days.  Only taking half coreg as seemed to make him fatigued,does not want further med chagne if possible despite the afib with rvr today, wants to work on exercise and f/u cardiology.  . Past Medical History:  Diagnosis Date  . AICD (automatic cardioverter/defibrillator) present    [place]  . ALLERGIC RHINITIS   . ANXIETY   . Arthritis    IN FINGERS  . Cardiac arrest ([place]) 11/21/2015  . Cardiac device in situ   . HYPERLIPIDEMIA   . HYPERTENSION   . LOW BACK PAIN   . Myocardial infarction ([place]) 11/21/2015  . Paroxysmal atrial fibrillation [facility])    Past Surgical History:  Procedure Laterality Date  . CARDIAC CATHETERIZATION N/A 11/21/2015   Procedure: Left Heart Cath and Coronary Angiography;  Surgeon: Wellington Hampshire, MD;  Location: [place] [facility];  Service: Cardiovascular;  Laterality: N/A;  . EP IMPLANTABLE DEVICE N/A 11/29/2015   Procedure: ICD Implant;  Surgeon: Deboraha Sprang, MD;  Location: [place] [facility];  Service: Cardiovascular;  Laterality: N/A;  . INGUINAL HERNIA REPAIR Left 02/09/2017   Procedure: OPEN REPAIR LEFT INGUINAL HERNIA ;  Surgeon: Greer Pickerel, MD;  [facility];  Service: General;  Laterality: Left;  . INSERTION OF MESH Left 02/09/2017   Procedure: INSERTION OF MESH;  Surgeon: Greer Pickerel, MD;  [facility];  Service: General;  Laterality: Left;    reports that he quit smoking about 12 years ago. His smoking use included cigarettes. He has a 20.00 pack-year smoking history. He has never used smokeless tobacco. He reports that he does not drink alcohol and does not use drugs. family history includes Hypertension in an other family member. Allergies  Allergen Reactions  . Lipitor [Atorvastatin]     myalgia  . Meperidine Hcl Other (See Comments)    "TURNED WHITE AS A SHEEP, SWEATY COULD NOT STAND" DEMEROL   Current Outpatient Medications on File Prior to Visit  Medication Sig Dispense Refill  . amLODipine (NORVASC) 5 MG tablet TAKE 1 TABLET BY MOUTH EVERY DAY 90 tablet 3  . b complex vitamins tablet Take 1 tablet by mouth daily.    . carvedilol (COREG) 25 MG tablet TAKE 0.5 TABLETS (12.5 MG TOTAL) BY MOUTH 2 (TWO) TIMES DAILY WITH A MEAL.  90 tablet 1  . ELIQUIS 5 MG TABS tablet TAKE 1 TABLET BY MOUTH TWICE A DAY 180 tablet 2  . eszopiclone (LUNESTA) 2 MG TABS tablet Take 1 tablet (2 mg total) by mouth at bedtime as needed for sleep. Take immediately before bedtime 30 tablet 5  . ibuprofen (ADVIL,MOTRIN) 200 MG tablet Take 200 mg by mouth every 6 (six) hours as needed (Pain).    . Multiple Vitamins-Minerals (MULTIVITAMIN WITH MINERALS) tablet Take 1 tablet by mouth daily.    Marland Kitchen olmesartan-hydrochlorothiazide (BENICAR HCT) 40-12.5 MG tablet Take 1 tablet by mouth  daily. 90 tablet 3  . rosuvastatin (CRESTOR) 40 MG tablet Take 1 tablet (40 mg total) by mouth daily. Annual appt due in Oct must see provider for future refills 90 tablet 1  . citalopram (CELEXA) 10 MG tablet Take 1 tablet (10 mg total) by mouth daily. 90 tablet 3   No current facility-administered medications on file prior to visit.   Review of Systems All otherwise neg per pt     Objective:   Physical Exam BP (!) 150/90 (BP Location: Left Arm, Patient Position: Sitting, Cuff Size: Large)   Pulse (!) 123   Temp 98.5 F (36.9 C) (Oral)   Ht 6' (1.829 m)   Wt 177 lb (80.3 kg)   SpO2 98%   BMI 24.01 kg/m  VS noted,  Constitutional: Pt appears in NAD HENT: Head: NCAT.  Right Ear: External ear normal.  Left Ear: External ear normal.  Eyes: . Pupils are equal, round, and reactive to light. Conjunctivae and EOM are normal Nose: without d/c or deformity Neck: Neck supple. Gross normal ROM Cardiovascular: iRIR and tachycardic Pulmonary/Chest: Effort normal and breath sounds without rales or wheezing.  Abd:  Soft, NT, ND, + BS, no organomegaly Neurological: Pt is alert. At baseline orientation, motor grossly intact Skin: Skin is warm. No rashes, other new lesions, no LE edema Psychiatric: Pt behavior is normal without agitation  All otherwise neg per pt  Lab Results  Component Value Date   WBC 11.8 (H) 10/13/2020   HGB 13.5 10/13/2020   HCT 40.0 10/13/2020   PLT 427.0 (H) 10/13/2020   GLUCOSE 87 10/13/2020   CHOL 124 10/13/2020   TRIG 64.0 10/13/2020   HDL 37.10 (L) 10/13/2020   LDLDIRECT 108.0 03/02/2016   LDLCALC 74 10/13/2020   ALT 50 10/13/2020   AST 44 (H) 10/13/2020   NA 135 10/13/2020   K 4.4 10/13/2020   CL 99 10/13/2020   CREATININE 0.71 10/13/2020   BUN 8 10/13/2020   CO2 27 10/13/2020   TSH 0.94 10/13/2020   PSA 0.62 10/13/2020   INR 0.99 02/09/2017   HGBA1C 6.2 09/30/2019      Assessment & Plan:

## 2020-10-13 NOTE — Patient Instructions (Addendum)
Please remember to have the covid booster shot soon  Please take all new medication as prescribed - the cardizem ER 180 mg per day  Please remember to see Dr Caryl Comes asap  Please continue all other medications as before, and refills have been done if requested.  Please have the pharmacy call with any other refills you may need.  Please continue your efforts at being more active, low cholesterol diet, and weight control.  You are otherwise up to date with prevention measures today.  Please keep your appointments with your specialists as you may have planned  Please go to the LAB at the blood drawing area for the tests to be done  You will be contacted by phone if any changes need to be made immediately.  Otherwise, you will receive a letter about your results with an explanation, but please check with MyChart first.  Please remember to sign up for MyChart if you have not done so, as this will be important to you in the future with finding out test results, communicating by private email, and scheduling acute appointments online when needed.  Please make an Appointment to return in 6 months, or sooner if needed

## 2020-10-16 ENCOUNTER — Other Ambulatory Visit: Payer: Self-pay | Admitting: Internal Medicine

## 2020-10-16 NOTE — Telephone Encounter (Signed)
Please refill as per office routine med refill policy (all routine meds refilled for 3 mo or monthly per pt preference up to one year from last visit, then month to month grace period for 3 mo, then further med refills will have to be denied)  

## 2020-10-17 ENCOUNTER — Encounter: Payer: Self-pay | Admitting: Internal Medicine

## 2020-10-17 NOTE — Assessment & Plan Note (Addendum)
Mild uncontrolled, declines ecg and/or ED referral, to add card ER 180 qd, follow up cardiology - pt states will do so asap by calling himself  I spent 31 minutes in addition to time for CPX wellness examination in preparing to see the patient by review of recent labs, imaging and procedures, obtaining and reviewing separately obtained history, communicating with the patient and family or caregiver, ordering medications, tests or procedures, and documenting clinical information in the EHR including the differential Dx, treatment, and any further evaluation and other management of afib with RVR, htn, hld, anxiety

## 2020-10-17 NOTE — Assessment & Plan Note (Signed)
stable overall by history and exam, recent data reviewed with pt, and pt to continue medical treatment as before,  to f/u any worsening symptoms or concerns  

## 2020-10-17 NOTE — Assessment & Plan Note (Signed)

## 2020-10-19 DIAGNOSIS — D72829 Elevated white blood cell count, unspecified: Secondary | ICD-10-CM | POA: Diagnosis not present

## 2020-10-19 DIAGNOSIS — R0982 Postnasal drip: Secondary | ICD-10-CM | POA: Diagnosis not present

## 2020-10-19 DIAGNOSIS — R059 Cough, unspecified: Secondary | ICD-10-CM | POA: Diagnosis not present

## 2020-10-19 DIAGNOSIS — R7989 Other specified abnormal findings of blood chemistry: Secondary | ICD-10-CM | POA: Diagnosis not present

## 2020-10-26 ENCOUNTER — Other Ambulatory Visit: Payer: Self-pay

## 2020-10-26 ENCOUNTER — Emergency Department
Admission: EM | Admit: 2020-10-26 | Discharge: 2020-10-26 | Disposition: A | Discharge status: Home / Self Care | Payer: BC Managed Care – PPO | Attending: Emergency Medicine | Admitting: Emergency Medicine

## 2020-10-26 ENCOUNTER — Emergency Department: Payer: BC Managed Care – PPO

## 2020-10-26 ENCOUNTER — Ambulatory Visit (INDEPENDENT_AMBULATORY_CARE_PROVIDER_SITE_OTHER): Payer: BC Managed Care – PPO

## 2020-10-26 DIAGNOSIS — I469 Cardiac arrest, cause unspecified: Secondary | ICD-10-CM

## 2020-10-26 DIAGNOSIS — S62661B Nondisplaced fracture of distal phalanx of left index finger, initial encounter for open fracture: Secondary | ICD-10-CM | POA: Diagnosis not present

## 2020-10-26 DIAGNOSIS — Z79899 Other long term (current) drug therapy: Secondary | ICD-10-CM | POA: Diagnosis not present

## 2020-10-26 DIAGNOSIS — Z955 Presence of coronary angioplasty implant and graft: Secondary | ICD-10-CM | POA: Diagnosis not present

## 2020-10-26 DIAGNOSIS — Z7902 Long term (current) use of antithrombotics/antiplatelets: Secondary | ICD-10-CM | POA: Insufficient documentation

## 2020-10-26 DIAGNOSIS — S61225A Laceration with foreign body of left ring finger without damage to nail, initial encounter: Secondary | ICD-10-CM | POA: Insufficient documentation

## 2020-10-26 DIAGNOSIS — S62631B Displaced fracture of distal phalanx of left index finger, initial encounter for open fracture: Secondary | ICD-10-CM | POA: Insufficient documentation

## 2020-10-26 DIAGNOSIS — S62631A Displaced fracture of distal phalanx of left index finger, initial encounter for closed fracture: Secondary | ICD-10-CM | POA: Diagnosis not present

## 2020-10-26 DIAGNOSIS — Z87891 Personal history of nicotine dependence: Secondary | ICD-10-CM | POA: Insufficient documentation

## 2020-10-26 DIAGNOSIS — S62621A Displaced fracture of medial phalanx of left index finger, initial encounter for closed fracture: Secondary | ICD-10-CM | POA: Diagnosis not present

## 2020-10-26 DIAGNOSIS — S61211A Laceration without foreign body of left index finger without damage to nail, initial encounter: Secondary | ICD-10-CM | POA: Diagnosis not present

## 2020-10-26 DIAGNOSIS — I10 Essential (primary) hypertension: Secondary | ICD-10-CM | POA: Diagnosis not present

## 2020-10-26 DIAGNOSIS — I25119 Atherosclerotic heart disease of native coronary artery with unspecified angina pectoris: Secondary | ICD-10-CM | POA: Insufficient documentation

## 2020-10-26 DIAGNOSIS — Z23 Encounter for immunization: Secondary | ICD-10-CM | POA: Diagnosis not present

## 2020-10-26 DIAGNOSIS — S61213A Laceration without foreign body of left middle finger without damage to nail, initial encounter: Secondary | ICD-10-CM | POA: Insufficient documentation

## 2020-10-26 DIAGNOSIS — W312XXA Contact with powered woodworking and forming machines, initial encounter: Secondary | ICD-10-CM | POA: Insufficient documentation

## 2020-10-26 DIAGNOSIS — S62601B Fracture of unspecified phalanx of left index finger, initial encounter for open fracture: Secondary | ICD-10-CM

## 2020-10-26 DIAGNOSIS — S61215A Laceration without foreign body of left ring finger without damage to nail, initial encounter: Secondary | ICD-10-CM | POA: Diagnosis not present

## 2020-10-26 LAB — CUP PACEART REMOTE DEVICE CHECK
Battery Remaining Longevity: 48 mo
Battery Remaining Percentage: 47 %
Battery Voltage: 2.96 V
Brady Statistic RV Percent Paced: 1 %
Date Time Interrogation Session: 20211115192206
HighPow Impedance: 42 Ohm
HighPow Impedance: 42 Ohm
Implantable Lead Implant Date: 20161219
Implantable Lead Location: 753860
Implantable Pulse Generator Implant Date: 20161219
Lead Channel Impedance Value: 330 Ohm
Lead Channel Pacing Threshold Amplitude: 1.25 V
Lead Channel Pacing Threshold Pulse Width: 0.6 ms
Lead Channel Sensing Intrinsic Amplitude: 6.2 mV
Lead Channel Setting Pacing Amplitude: 2.5 V
Lead Channel Setting Pacing Pulse Width: 0.5 ms
Lead Channel Setting Sensing Sensitivity: 0.5 mV
Pulse Gen Serial Number: 7219005

## 2020-10-26 MED ORDER — HYDROCODONE-ACETAMINOPHEN 5-325 MG PO TABS
1.0000 | ORAL_TABLET | ORAL | 0 refills | Status: DC | PRN
Start: 2020-10-26 — End: 2020-11-04

## 2020-10-26 MED ORDER — TETANUS-DIPHTH-ACELL PERTUSSIS 5-2.5-18.5 LF-MCG/0.5 IM SUSY
0.5000 mL | PREFILLED_SYRINGE | Freq: Once | INTRAMUSCULAR | Status: AC
  Administered 2020-10-26: 0.5 mL via INTRAMUSCULAR
  Filled 2020-10-26: qty 0.5

## 2020-10-26 MED ORDER — LORAZEPAM 1 MG PO TABS
1.0000 mg | ORAL_TABLET | Freq: Once | ORAL | Status: AC
  Administered 2020-10-26: 1 mg via ORAL
  Filled 2020-10-26: qty 1

## 2020-10-26 MED ORDER — HYDROCODONE-ACETAMINOPHEN 5-325 MG PO TABS
1.0000 | ORAL_TABLET | Freq: Once | ORAL | Status: AC
  Administered 2020-10-26: 1 via ORAL
  Filled 2020-10-26: qty 1

## 2020-10-26 MED ORDER — LIDOCAINE HCL (PF) 1 % IJ SOLN
20.0000 mL | Freq: Once | INTRAMUSCULAR | Status: AC
  Administered 2020-10-26: 20 mL
  Filled 2020-10-26: qty 20

## 2020-10-26 MED ORDER — CLINDAMYCIN HCL 300 MG PO CAPS: 300.0000 mg | ORAL_CAPSULE | Freq: Four times a day (QID) | ORAL | 0 refills | Status: DC

## 2020-10-26 NOTE — ED Triage Notes (Addendum)
Pt here via [place] with wife from home. Pt was using a table saw today to cut new flooring, saw cut through patients right index finger and right middle finger. Pt reports loss of sensation in top of index finger. Pt takes eliquis. Denies all other injury. Reports last tetanus shot was less than 10 years ago.

## 2020-10-26 NOTE — ED Provider Notes (Signed)
[facility] Emergency Department Provider Note  ____________________________________________  Time seen: Approximately 8:49 PM  I have reviewed the triage vital signs and the nursing notes.   HISTORY  Chief Complaint Extremity Laceration    HPI Daniel Richard is a 64 y.o. male who presents the emergency department complaining of lacerations of the left index, middle and ring finger.  Patient states that he was using a table saw to cut a piece of wood when the wood jerked, causing his hand to come in contact with the moving sawblade.  Patient states that he sustained injuries to the index, middle and ring finger.  Patient is concerned that he may have amputated his index finger.  He states it was "dangling" when attempting to rinse the area off.  Patient states that he is on Eliquis but did not have difficulty stopping the bleeding.  Patient states that with direct pressure bleeding stopped.  He believes that he has loss of range of motion of the index and middle finger.  Sensation loss to the middle finger is reported.  No other injury or complaint.  Patient states that he believes he is out of date on his tetanus shot.  Patient with medical history as described below with no complaints with chronic medical issues.         Past Medical History:  Diagnosis Date  . AICD (automatic cardioverter/defibrillator) present    ST JUDE  . ALLERGIC RHINITIS   . ANXIETY   . Arthritis    IN FINGERS  . Cardiac arrest ([place]) 11/21/2015  . Cardiac device in situ   . HYPERLIPIDEMIA   . HYPERTENSION   . LOW BACK PAIN   . Myocardial infarction ([place]) 11/21/2015  . Paroxysmal atrial fibrillation [facility])     Patient Active Problem List   Diagnosis Date Noted  . Atrial fibrillation with rapid ventricular response ([place]) 10/13/2020  . LLQ pain 12/17/2019  . Insomnia 10/11/2019  . Left inguinal hernia 11/28/2016  . Hyperlipidemia LDL goal <70 11/30/2015  . Cardiac device in  situ   . Sinus tachycardia   . Intercostal pain   . Coronary artery disease involving native coronary artery of native heart with angina pectoris ([place])   . Paroxysmal atrial fibrillation ([facility])   . Cardiac arrest ([place]) 11/21/2015  . Headache(784.0) 03/29/2014  . Occipital neuralgia of left side 03/25/2014  . Unspecified sinusitis (chronic) 03/25/2014  . Mouth pain 01/30/2014  . Myalgia 08/29/2013  . Encounter for well adult exam with abnormal findings 02/21/2013  . Anxiety state 07/16/2007  . Essential hypertension, benign 07/16/2007  . ALLERGIC RHINITIS 07/16/2007  . LOW BACK PAIN 07/16/2007    Past Surgical History:  Procedure Laterality Date  . CARDIAC CATHETERIZATION N/A 11/21/2015   Procedure: Left Heart Cath and Coronary Angiography;  Surgeon: Wellington Hampshire, MD;  Location: [place] [facility];  Service: Cardiovascular;  Laterality: N/A;  . EP IMPLANTABLE DEVICE N/A 11/29/2015   Procedure: ICD Implant;  Surgeon: Deboraha Sprang, MD;  Location: [place] [facility];  Service: Cardiovascular;  Laterality: N/A;  . INGUINAL HERNIA REPAIR Left 02/09/2017   Procedure: OPEN REPAIR LEFT INGUINAL HERNIA ;  Surgeon: Greer Pickerel, MD;  Location: [facility];  Service: General;  Laterality: Left;  . INSERTION OF MESH Left 02/09/2017   Procedure: INSERTION OF MESH;  Surgeon: Greer Pickerel, MD;  Location: [facility];  Service: General;  Laterality: Left;    Prior to Admission medications   Medication Sig Start Date  End Date Taking? Authorizing Provider  amLODipine (NORVASC) 5 MG tablet TAKE 1 TABLET BY MOUTH EVERY DAY 09/20/20   Biagio Borg, MD  b complex vitamins tablet Take 1 tablet by mouth daily.    [provider]  carvedilol (COREG) 25 MG tablet TAKE 0.5 TABLETS (12.5 MG TOTAL) BY MOUTH 2 (TWO) TIMES DAILY WITH A MEAL. 08/09/20   Biagio Borg, MD  citalopram (CELEXA) 10 MG tablet Take 1 tablet (10 mg total) by mouth daily. 09/30/19 09/29/20  Biagio Borg, MD  clindamycin (CLEOCIN) 300 MG  capsule Take 1 capsule (300 mg total) by mouth 4 (four) times daily. 10/26/20   Alexzia Kasler, Charline Bills, PA-C  diltiazem (CARDIZEM CD) 180 MG 24 hr capsule Take 1 capsule (180 mg total) by mouth daily. 10/13/20   Biagio Borg, MD  ELIQUIS 5 MG TABS tablet TAKE 1 TABLET BY MOUTH TWICE A DAY 03/09/20   Biagio Borg, MD  eszopiclone (LUNESTA) 2 MG TABS tablet Take 1 tablet (2 mg total) by mouth at bedtime as needed for sleep. Take immediately before bedtime 09/30/19   Biagio Borg, MD  HYDROcodone-acetaminophen (NORCO/VICODIN) 5-325 MG tablet Take 1 tablet by mouth every 4 (four) hours as needed for moderate pain. 10/26/20   Jamaiyah Pyle, Charline Bills, PA-C  ibuprofen (ADVIL,MOTRIN) 200 MG tablet Take 200 mg by mouth every 6 (six) hours as needed (Pain).    [provider]  Multiple Vitamins-Minerals (MULTIVITAMIN WITH MINERALS) tablet Take 1 tablet by mouth daily.    [provider]  olmesartan-hydrochlorothiazide (BENICAR HCT) 40-12.5 MG tablet Take 1 tablet by mouth daily. 09/30/19   Biagio Borg, MD  rosuvastatin (CRESTOR) 40 MG tablet TAKE 1 TABLET BY MOUTH EVERY DAY 10/18/20   Biagio Borg, MD    Allergies Lipitor [atorvastatin] and Meperidine hcl  Family History  Problem Relation Age of Onset  . Hypertension Other     Social History Social History   Tobacco Use  . Smoking status: Former Smoker    Packs/day: 1.00    Years: 20.00    Pack years: 20.00    Types: Cigarettes    Quit date: 12/12/2007    Years since quitting: 12.8  . Smokeless tobacco: Never Used  Substance Use Topics  . Alcohol use: Yes  . Drug use: No     Review of Systems  Constitutional: No fever/chills Eyes: No visual changes. No discharge ENT: No upper respiratory complaints. Cardiovascular: no chest pain. Respiratory: no cough. No SOB. Gastrointestinal: No abdominal pain.  No nausea, no vomiting.  No diarrhea.  No constipation. Musculoskeletal: Positive for injury to the index, middle and  ring finger of the left hand from a table saw Skin: Negative for rash, abrasions, lacerations, ecchymosis. Neurological: Negative for headaches, focal weakness or numbness.  10 System ROS otherwise negative.  ____________________________________________   PHYSICAL EXAM:  VITAL SIGNS: ED Triage Vitals  Enc Vitals Group     BP 10/26/20 1810 (!) 163/101     Pulse Rate 10/26/20 1810 99     Resp 10/26/20 1810 18     Temp 10/26/20 1810 98.8 F (37.1 C)     Temp Source 10/26/20 1810 Oral     SpO2 10/26/20 1810 99 %     Weight 10/26/20 1811 175 lb (79.4 kg)     Height 10/26/20 1811 6' (1.829 m)     Head Circumference --      Peak Flow --      Pain  Score 10/26/20 1811 10     Pain Loc --      Pain Edu? --      Excl. in [place]? --      Constitutional: Alert and oriented. Well appearing and in no acute distress. Eyes: Conjunctivae are normal. PERRL. EOMI. Head: Atraumatic. ENT:      Ears:       Nose: No congestion/rhinnorhea.      Mouth/Throat: Mucous membranes are moist.  Neck: No stridor.    Cardiovascular: Normal rate, regular rhythm. Normal S1 and S2.  Good peripheral circulation. Respiratory: Normal respiratory effort without tachypnea or retractions. Lungs CTAB. Good air entry to the bases with no decreased or absent breath sounds. Musculoskeletal: Full range of motion to all extremities. No gross deformities appreciated.  Hand is unbandaged in the emergency department.  Patient has no active bleeding with removal of bandaging.  Patient does have injuries to the index, middle, ring finger.  Soft tissue laceration noted along the index finger started on the palmar aspect over the distal phalanx wrapping around the index finger to the dorsal aspect along the medial part of the finger.  This involves the medial nail bed.  No visible foreign body.  Patient is able to flex and extend the PIP joint but there is no flexion of the DIP joint.  With manual flexion of the DIP joint, the index  finger can be extended.  Visualization of middle finger reveals ragged laceration and starting over the middle phalanx extending medially and proximally along the PIP joint of the medial aspect of the finger.  No active bleeding.  No visible foreign body.  Patient is able to extend and flex the PIP joint, is able to extend the DIP joint with manual flexion of the distal phalanx but is unable to flex the distal phalanx by himself.  Examination of the ring finger revealed avulsion style laceration along the medial aspect.  No active bleeding.  No visible foreign body.  Edges are not able to be approximated.  Patient has full range of motion with flexion and extension of all joints of the ring finger.  Prior to anesthetize a shin, all digits had good capillary refill, sensation intact to the index and ring finger, sensation is decreased to the middle finger but still present.  Post procedure, good capillary refill all digits.  All digits have been anesthetized and unable to determine sensation status. Neurologic:  Normal speech and language. No gross focal neurologic deficits are appreciated.  Skin:  Skin is warm, dry and intact. No rash noted. Psychiatric: Mood and affect are normal. Speech and behavior are normal. Patient exhibits appropriate insight and judgement.   ____________________________________________   LABS (all labs ordered are listed, but only abnormal results are displayed)  Labs Reviewed - No data to display ____________________________________________  EKG   ____________________________________________  RADIOLOGY I personally viewed and evaluated these images as part of my medical decision making, as well as reviewing the written report by the radiologist.  ED Provider Interpretation: I agree with radiologist finding of the fractures of the middle and distal phalanxes of the left index finger as well as a distal tuft fracture.  No other fractures identified on imaging.  DG Hand  Complete Left  Result Date: 10/26/2020 CLINICAL DATA:  Table saw injury to index, middle and ring fingers EXAM: LEFT HAND - COMPLETE 3+ VIEW COMPARISON:  None. FINDINGS: Fracture noted through the distal aspect of the right index finger middle phalanx and the proximal  aspect of the right index finger distal phalanx. Fracture also noted through the tip of the distal phalanx of the left index finger. No fracture seen in the middle or ring fingers. No dislocation. IMPRESSION: Fractures in the middle and distal phalanges of the left index fingers adjacent to the DIP joint. Fracture at the tip of the distal phalanx of the left index finger. Electronically Signed   By: Rolm Baptise M.D.   On: 10/26/2020 19:29    ____________________________________________    PROCEDURES  Procedure(s) performed:    Marland KitchenMarland KitchenLaceration Repair  Date/Time: 10/26/2020 9:31 PM Performed by: Darletta Moll, PA-C Authorized by: Darletta Moll, PA-C   Consent:    Consent obtained:  Verbal   Consent given by:  Patient   Risks discussed:  Pain, poor cosmetic result, poor wound healing, need for additional repair and infection   Alternatives discussed:  Referral Anesthesia (see MAR for exact dosages):    Anesthesia method:  Nerve block   Block location:  Index finger   Block needle gauge:  27 G   Block anesthetic:  Lidocaine 1% w/o epi   Block technique:  Digital block   Block injection procedure:  Anatomic landmarks identified, introduced needle, negative aspiration for blood and incremental injection   Block outcome:  Anesthesia achieved Laceration details:    Location:  Finger   Finger location:  L index finger   Length (cm):  4 Repair type:    Repair type:  Intermediate Pre-procedure details:    Preparation:  Patient was prepped and draped in usual sterile fashion and imaging obtained to evaluate for foreign bodies Exploration:    Hemostasis achieved with:  Direct pressure   Wound exploration:  wound explored through full range of motion and entire depth of wound probed and visualized     Wound extent: tendon damage and underlying fracture     Wound extent: no foreign bodies/material noted, no nerve damage noted and no vascular damage noted     Tendon damage location:  Upper extremity   Upper extremity tendon damage location:  Finger flexor   Tendon damage extent:  Complete transection   Tendon repair plan:  Refer for evaluation   Contaminated: no   Treatment:    Area cleansed with:  Betadine and saline   Amount of cleaning:  Extensive   Irrigation volume:  1 L   Irrigation method:  Syringe Skin repair:    Repair method:  Sutures   Suture size:  4-0   Suture material:  Nylon   Suture technique:  Running locked   Number of sutures:  1 (1 running interlock suture with 19 throws) Approximation:    Approximation:  Close Post-procedure details:    Dressing:  Splint for protection and tube gauze   Patient tolerance of procedure:  Tolerated well, no immediate complications Comments:     Laceration with extension into a deep avulsion.  Laceration with edges able to be approximated is closed using sutures as described above.  Thorough cleansing of the area.  It does appear that patient has lacerated through the flexor tendon of the digit.  Patient has underlying fractures on imaging.  No retained foreign body.  Extensive cleaning was undertaken.  After sutures were placed, Surgicel dressing applied to avulsion area.  This laceration extends into the fingernail but there is no removal of the nail as remaining nail is firmly attached.  Area is covered in tube gauze, splint for stabilization along with the middle and ring finger.  Marland KitchenMarland Kitchen  Laceration Repair  Date/Time: 10/26/2020 10:00 PM Performed by: Darletta Moll, PA-C Authorized by: Darletta Moll, PA-C   Consent:    Consent obtained:  Verbal   Consent given by:  Patient   Risks discussed:  Infection, pain, poor cosmetic  result, poor wound healing and need for additional repair   Alternatives discussed:  Referral Anesthesia (see MAR for exact dosages):    Anesthesia method:  Nerve block   Block location:  Middle finger L hand   Block needle gauge:  27 G   Block anesthetic:  Lidocaine 1% w/o epi   Block technique:  Digital block   Block injection procedure:  Anatomic landmarks identified, introduced needle, negative aspiration for blood and incremental injection   Block outcome:  Anesthesia achieved Laceration details:    Location:  Finger   Finger location:  L long finger   Length (cm):  5 Repair type:    Repair type:  Intermediate Pre-procedure details:    Preparation:  Patient was prepped and draped in usual sterile fashion and imaging obtained to evaluate for foreign bodies Exploration:    Hemostasis achieved with:  Direct pressure   Wound exploration: wound explored through full range of motion and entire depth of wound probed and visualized     Wound extent: tendon damage     Wound extent: no foreign bodies/material noted, no muscle damage noted, no nerve damage noted, no underlying fracture noted and no vascular damage noted     Tendon damage location:  Upper extremity   Upper extremity tendon damage location:  Finger flexor   Tendon damage extent:  Complete transection   Tendon repair plan:  Refer for evaluation   Contaminated: no   Treatment:    Area cleansed with:  Betadine and saline   Amount of cleaning:  Extensive   Irrigation solution:  Sterile saline   Irrigation volume:  1 L   Irrigation method:  Syringe Skin repair:    Repair method:  Sutures   Suture size:  4-0   Suture material:  Nylon   Suture technique:  Running locked   Number of sutures:  1 (1 running interlock suture with 27 throws) Approximation:    Approximation:  Close Post-procedure details:    Dressing:  Tube gauze and splint for protection   Patient tolerance of procedure:  Tolerated well, no immediate  complications .Marland KitchenLaceration Repair  Date/Time: 10/26/2020 10:03 PM Performed by: Darletta Moll, PA-C Authorized by: Darletta Moll, PA-C   Consent:    Consent obtained:  Verbal   Consent given by:  Patient   Risks discussed:  Pain, infection, poor cosmetic result, poor wound healing and need for additional repair Anesthesia (see MAR for exact dosages):    Anesthesia method:  Nerve block   Block location:  Ring finger L hand   Block needle gauge:  27 G   Block anesthetic:  Lidocaine 1% w/o epi   Block technique:  Digital block   Block injection procedure:  Anatomic landmarks identified, introduced needle, negative aspiration for blood and incremental injection   Block outcome:  Anesthesia achieved Laceration details:    Location:  Finger   Finger location:  L ring finger   Length (cm):  1.5 Repair type:    Repair type:  Simple Pre-procedure details:    Preparation:  Patient was prepped and draped in usual sterile fashion and imaging obtained to evaluate for foreign bodies Exploration:    Hemostasis achieved with:  Direct pressure  Wound exploration: wound explored through full range of motion and entire depth of wound probed and visualized     Wound extent: no foreign bodies/material noted, no muscle damage noted, no nerve damage noted, no tendon damage noted, no underlying fracture noted and no vascular damage noted     Contaminated: no   Treatment:    Area cleansed with:  Betadine and saline   Amount of cleaning:  Extensive   Irrigation solution:  Sterile saline   Irrigation volume:  500 ml   Irrigation method:  Syringe Post-procedure details:    Dressing:  Splint for protection (Surgicel dressing with tube gauze)   Patient tolerance of procedure:  Tolerated well, no immediate complications Comments:     Area was thoroughly cleansed, area was anesthetized for pain relief with digital block.  Area was avulsion type laceration and edges were not able to be  approximated.  Surgicel dressing placed with 2 gauze over top.  Splint for protection.      Medications  lidocaine (PF) (XYLOCAINE) 1 % injection 20 mL (20 mLs Infiltration Given 10/26/20 1935)  Tdap (BOOSTRIX) injection 0.5 mL (0.5 mLs Intramuscular Given 10/26/20 1913)  LORazepam (ATIVAN) tablet 1 mg (1 mg Oral Given 10/26/20 1935)  HYDROcodone-acetaminophen (NORCO/VICODIN) 5-325 MG per tablet 1 tablet (1 tablet Oral Given 10/26/20 2127)     ____________________________________________   INITIAL IMPRESSION / ASSESSMENT AND PLAN / ED COURSE  Pertinent labs & imaging results that were available during my care of the patient were reviewed by me and considered in my medical decision making (see chart for details).  Review of the [place] CSRS was performed in accordance of the [place] prior to dispensing any controlled drugs.           Patient's diagnosis is consistent with laceration of the index, middle and ring finger of the left hand.  Patient has underlying fractures of the index finger as well as likely complete resection of the flexor tendon of both the index and middle finger.  Patient presented after making contact with a table saw that was running.  Patient sustained lacerations to the 3 fingers as described above.  Areas were thoroughly cleansed, 2 of the digits had sutures placed.  The other had Surgicel dressing as it was an avulsion type laceration.  The index finger laceration extended from a laceration that was able to be repaired into a deep avulsion.  Hand was splinted using a volar wrist splint extending past the edges of the fingers.  Wound care instructions discussed with the patient.  Given the nature of the injury and likely transection of the flexor tendon of the index and middle finger, patient is referred to hand surgery.  Patient is already on doxycycline, had just been placed for community-acquired pneumonia on doxycycline.  I will add clindamycin for dual coverage.   Limited prescription for pain medication as prescribed for the patient.  Again follow-up with hand surgery.  Return precautions discussed with the patient and his wife. Patient is given ED precautions to return to the ED for any worsening or new symptoms.     ____________________________________________  FINAL CLINICAL IMPRESSION(S) / ED DIAGNOSES  Final diagnoses:  Laceration of left middle finger without foreign body without damage to nail, initial encounter  Laceration of left ring finger without foreign body without damage to nail, initial encounter  Open nondisplaced fracture of phalanx of left index finger, unspecified phalanx, initial encounter      NEW MEDICATIONS STARTED DURING THIS VISIT:  ED Discharge Orders         Ordered    clindamycin (CLEOCIN) 300 MG capsule  4 times daily        10/26/20 2053    HYDROcodone-acetaminophen (NORCO/VICODIN) 5-325 MG tablet  Every 4 hours PRN        10/26/20 2053              This chart was dictated using voice recognition software/Dragon. Despite best efforts to proofread, errors can occur which can change the meaning. Any change was purely unintentional.    Darletta Moll, PA-C 10/26/20 2206    Duffy Bruce, MD 10/28/20 1510

## 2020-10-28 NOTE — Progress Notes (Signed)
Remote ICD transmission.   

## 2020-11-01 DIAGNOSIS — S61215A Laceration without foreign body of left ring finger without damage to nail, initial encounter: Secondary | ICD-10-CM | POA: Diagnosis not present

## 2020-11-01 DIAGNOSIS — S61311A Laceration without foreign body of left index finger with damage to nail, initial encounter: Secondary | ICD-10-CM | POA: Diagnosis not present

## 2020-11-02 ENCOUNTER — Encounter: Payer: BC Managed Care – PPO | Admitting: Student

## 2020-11-04 ENCOUNTER — Encounter (HOSPITAL_COMMUNITY): Payer: Self-pay | Admitting: *Deleted

## 2020-11-04 ENCOUNTER — Emergency Department (HOSPITAL_COMMUNITY): Payer: BC Managed Care – PPO

## 2020-11-04 ENCOUNTER — Other Ambulatory Visit: Payer: Self-pay

## 2020-11-04 ENCOUNTER — Emergency Department (HOSPITAL_COMMUNITY)
Admission: EM | Admit: 2020-11-04 | Discharge: 2020-11-04 | Disposition: A | Discharge status: Home / Self Care | Payer: BC Managed Care – PPO | Attending: Emergency Medicine | Admitting: Emergency Medicine

## 2020-11-04 DIAGNOSIS — K7689 Other specified diseases of liver: Secondary | ICD-10-CM | POA: Diagnosis not present

## 2020-11-04 DIAGNOSIS — R918 Other nonspecific abnormal finding of lung field: Secondary | ICD-10-CM | POA: Diagnosis not present

## 2020-11-04 DIAGNOSIS — Z79899 Other long term (current) drug therapy: Secondary | ICD-10-CM | POA: Diagnosis not present

## 2020-11-04 DIAGNOSIS — I480 Paroxysmal atrial fibrillation: Secondary | ICD-10-CM | POA: Diagnosis not present

## 2020-11-04 DIAGNOSIS — R109 Unspecified abdominal pain: Secondary | ICD-10-CM | POA: Diagnosis not present

## 2020-11-04 DIAGNOSIS — Z87891 Personal history of nicotine dependence: Secondary | ICD-10-CM | POA: Diagnosis not present

## 2020-11-04 DIAGNOSIS — I10 Essential (primary) hypertension: Secondary | ICD-10-CM | POA: Diagnosis not present

## 2020-11-04 DIAGNOSIS — I2510 Atherosclerotic heart disease of native coronary artery without angina pectoris: Secondary | ICD-10-CM | POA: Diagnosis not present

## 2020-11-04 DIAGNOSIS — N289 Disorder of kidney and ureter, unspecified: Secondary | ICD-10-CM | POA: Diagnosis not present

## 2020-11-04 DIAGNOSIS — Z7901 Long term (current) use of anticoagulants: Secondary | ICD-10-CM | POA: Diagnosis not present

## 2020-11-04 DIAGNOSIS — R160 Hepatomegaly, not elsewhere classified: Secondary | ICD-10-CM | POA: Insufficient documentation

## 2020-11-04 HISTORY — DX: Atherosclerotic heart disease of native coronary artery without angina pectoris: I25.10

## 2020-11-04 LAB — COMPREHENSIVE METABOLIC PANEL
ALT: 55 U/L — ABNORMAL HIGH (ref 0–44)
AST: 67 U/L — ABNORMAL HIGH (ref 15–41)
Albumin: 2.8 g/dL — ABNORMAL LOW (ref 3.5–5.0)
Alkaline Phosphatase: 172 U/L — ABNORMAL HIGH (ref 38–126)
Anion gap: 11 (ref 5–15)
BUN: 10 mg/dL (ref 8–23)
CO2: 24 mmol/L (ref 22–32)
Calcium: 8.9 mg/dL (ref 8.9–10.3)
Chloride: 94 mmol/L — ABNORMAL LOW (ref 98–111)
Creatinine, Ser: 0.9 mg/dL (ref 0.61–1.24)
GFR, Estimated: >60 mL/min (ref >60)
Glucose, Bld: 118 mg/dL — ABNORMAL HIGH (ref 70–99)
Potassium: 4.4 mmol/L (ref 3.5–5.1)
Sodium: 129 mmol/L — ABNORMAL LOW (ref 135–145)
Total Bilirubin: 1.2 mg/dL (ref 0.3–1.2)
Total Protein: 7.6 g/dL (ref 6.5–8.1)

## 2020-11-04 LAB — URINALYSIS, ROUTINE W REFLEX MICROSCOPIC
Bilirubin Urine: NEGATIVE
Glucose, UA: NEGATIVE mg/dL
Hgb urine dipstick: NEGATIVE
Ketones, ur: NEGATIVE mg/dL
Leukocytes,Ua: NEGATIVE
Nitrite: NEGATIVE
Protein, ur: NEGATIVE mg/dL
Specific Gravity, Urine: 1.01 (ref 1.005–1.030)
pH: 7 (ref 5.0–8.0)

## 2020-11-04 LAB — CBC
HCT: 38.8 % — ABNORMAL LOW (ref 39.0–52.0)
Hemoglobin: 12.4 g/dL — ABNORMAL LOW (ref 13.0–17.0)
MCH: 29.5 pg (ref 26.0–34.0)
MCHC: 32 g/dL (ref 30.0–36.0)
MCV: 92.4 fL (ref 80.0–100.0)
Platelets: 370 10*3/uL (ref 150–400)
RBC: 4.2 MIL/uL — ABNORMAL LOW (ref 4.22–5.81)
RDW: 13.5 % (ref 11.5–15.5)
WBC: 19 10*3/uL — ABNORMAL HIGH (ref 4.0–10.5)
nRBC: 0 % (ref 0.0–0.2)

## 2020-11-04 LAB — LIPASE, BLOOD: Lipase: 28 U/L (ref 11–51)

## 2020-11-04 MED ORDER — HYDROCODONE-ACETAMINOPHEN 5-325 MG PO TABS
1.0000 | ORAL_TABLET | ORAL | 0 refills | Status: DC | PRN
Start: 2020-11-04 — End: 2020-11-09

## 2020-11-04 MED ORDER — ONDANSETRON 4 MG PO TBDP
4.0000 mg | ORAL_TABLET | Freq: Once | ORAL | Status: AC
  Administered 2020-11-04: 4 mg via ORAL
  Filled 2020-11-04: qty 1

## 2020-11-04 MED ORDER — MORPHINE SULFATE (PF) 4 MG/ML IV SOLN
4.0000 mg | Freq: Once | INTRAVENOUS | Status: AC
  Administered 2020-11-04: 4 mg via INTRAVENOUS
  Filled 2020-11-04: qty 1

## 2020-11-04 MED ORDER — IOHEXOL 300 MG/ML  SOLN
100.0000 mL | Freq: Once | INTRAMUSCULAR | Status: AC | PRN
  Administered 2020-11-04: 100 mL via INTRAVENOUS

## 2020-11-04 NOTE — Discharge Instructions (Addendum)
Please continue your Eliquis until instructed to stop it for your anticipated biopsy procedure.

## 2020-11-04 NOTE — ED Notes (Signed)
Patient transported to Ultrasound 

## 2020-11-04 NOTE — ED Triage Notes (Signed)
Pt states R sided pain since Tues, that is getting worse.  Denies changes in bowel or bladder habits.  Denies nausea.

## 2020-11-04 NOTE — ED Notes (Signed)
Patient transported to CT 

## 2020-11-04 NOTE — ED Notes (Signed)
Pt returned from CT °

## 2020-11-04 NOTE — ED Notes (Signed)
Pt returned from US

## 2020-11-04 NOTE — ED Provider Notes (Addendum)
[place] EMERGENCY DEPARTMENT Provider Note  CSN: [id number] Arrival date & time: 11/04/20 1025    History Chief Complaint  Patient presents with  . Abdominal Pain    HPI  Daniel Richard is a 64 y.o. male with 2 days of worsening, moderate R sided abdominal pain, radiating into R shoulder and lower back, not associated with nausea, vomiting, diarrhea, constipation, dysuria, hematuria or anorexia. No fevers at home. He is concerned about appendicitis. No previous abdominal surgeries.    Past Medical History:  Diagnosis Date  . AICD (automatic cardioverter/defibrillator) present    ST JUDE  . ALLERGIC RHINITIS   . ANXIETY   . Arthritis    IN FINGERS  . Cardiac arrest ([place]) 11/21/2015  . Cardiac device in situ   . Coronary artery disease   . HYPERLIPIDEMIA   . HYPERTENSION   . LOW BACK PAIN   . Myocardial infarction ([place]) 11/21/2015  . Paroxysmal atrial fibrillation [facility])     Past Surgical History:  Procedure Laterality Date  . CARDIAC CATHETERIZATION N/A 11/21/2015   Procedure: Left Heart Cath and Coronary Angiography;  Surgeon: Wellington Hampshire, MD;  Location: [place] CV LAB;  Service: Cardiovascular;  Laterality: N/A;  . EP IMPLANTABLE DEVICE N/A 11/29/2015   Procedure: ICD Implant;  Surgeon: Deboraha Sprang, MD;  Location: [place] CV LAB;  Service: Cardiovascular;  Laterality: N/A;  . INGUINAL HERNIA REPAIR Left 02/09/2017   Procedure: OPEN REPAIR LEFT INGUINAL HERNIA ;  Surgeon: Greer Pickerel, MD;  Location: [facility] OR[facility];  Service: General;  Laterality: Left;  . INSERTION OF MESH Left 02/09/2017   Procedure: INSERTION OF MESH;  Surgeon: Greer Pickerel, MD;  Location: [facility] OR[facility];  Service: General;  Laterality: Left;    Family History  Problem Relation Age of Onset  . Hypertension Other     Social History   Tobacco Use  . Smoking status: Former Smoker    Packs/day: 1.00    Years: 20.00    Pack years: 20.00    Types: Cigarettes    Quit date: 12/12/2007     Years since quitting: 12.9  . Smokeless tobacco: Never Used  Substance Use Topics  . Alcohol use: Yes    Comment: occ  . Drug use: No     Home Medications Prior to Admission medications   Medication Sig Start Date End Date Taking? Authorizing Provider  amLODipine (NORVASC) 5 MG tablet TAKE 1 TABLET BY MOUTH EVERY DAY 09/20/20   Biagio Borg, MD  b complex vitamins tablet Take 1 tablet by mouth daily.    [provider]  carvedilol (COREG) 25 MG tablet TAKE 0.5 TABLETS (12.5 MG TOTAL) BY MOUTH 2 (TWO) TIMES DAILY WITH A MEAL. 08/09/20   Biagio Borg, MD  citalopram (CELEXA) 10 MG tablet Take 1 tablet (10 mg total) by mouth daily. 09/30/19 09/29/20  Biagio Borg, MD  clindamycin (CLEOCIN) 300 MG capsule Take 1 capsule (300 mg total) by mouth 4 (four) times daily. 10/26/20   Cuthriell, Charline Bills, PA-C  diltiazem (CARDIZEM CD) 180 MG 24 hr capsule Take 1 capsule (180 mg total) by mouth daily. 10/13/20   Biagio Borg, MD  ELIQUIS 5 MG TABS tablet TAKE 1 TABLET BY MOUTH TWICE A DAY 03/09/20   Biagio Borg, MD  eszopiclone (LUNESTA) 2 MG TABS tablet Take 1 tablet (2 mg total) by mouth at bedtime as needed for sleep. Take immediately before bedtime 09/30/19   Biagio Borg,  MD  HYDROcodone-acetaminophen (NORCO/VICODIN) 5-325 MG tablet Take 1 tablet by mouth every 4 (four) hours as needed for moderate pain. 11/04/20   Truddie Hidden, MD  ibuprofen (ADVIL,MOTRIN) 200 MG tablet Take 200 mg by mouth every 6 (six) hours as needed (Pain).    [provider]  Multiple Vitamins-Minerals (MULTIVITAMIN WITH MINERALS) tablet Take 1 tablet by mouth daily.    [provider]  olmesartan-hydrochlorothiazide (BENICAR HCT) 40-12.5 MG tablet Take 1 tablet by mouth daily. 09/30/19   Biagio Borg, MD  rosuvastatin (CRESTOR) 40 MG tablet TAKE 1 TABLET BY MOUTH EVERY DAY 10/18/20   Biagio Borg, MD     Allergies    Lipitor [atorvastatin] and Meperidine hcl   Review of Systems    Review of Systems A comprehensive review of systems was completed and negative except as noted in HPI.    Physical Exam BP 131/79   Pulse (!) 126   Temp 99 F (37.2 C) (Oral)   Resp (!) 29   Ht 6' (1.829 m)   Wt 78 kg   SpO2 94%   BMI 23.33 kg/m   Physical Exam Vitals and nursing note reviewed.  Constitutional:      Appearance: Normal appearance.  HENT:     Head: Normocephalic and atraumatic.     Nose: Nose normal.     Mouth/Throat:     Mouth: Mucous membranes are moist.  Eyes:     Extraocular Movements: Extraocular movements intact.     Conjunctiva/sclera: Conjunctivae normal.  Cardiovascular:     Rate and Rhythm: Normal rate.  Pulmonary:     Effort: Pulmonary effort is normal.     Breath sounds: Normal breath sounds.  Abdominal:     General: Abdomen is flat.     Palpations: Abdomen is soft.     Tenderness: There is abdominal tenderness in the right upper quadrant. There is rebound. There is no guarding. Positive signs include Murphy's sign. Negative signs include McBurney's sign.  Musculoskeletal:        General: No swelling. Normal range of motion.     Cervical back: Neck supple.  Skin:    General: Skin is warm and dry.  Neurological:     General: No focal deficit present.     Mental Status: He is alert.  Psychiatric:        Mood and Affect: Mood normal.      ED Results / Procedures / Treatments   Labs (all labs ordered are listed, but only abnormal results are displayed) Labs Reviewed  COMPREHENSIVE METABOLIC PANEL - Abnormal; Notable for the following components:      Result Value   Sodium 129 (*)    Chloride 94 (*)    Glucose, Bld 118 (*)    Albumin 2.8 (*)    AST 67 (*)    ALT 55 (*)    Alkaline Phosphatase 172 (*)    All other components within normal limits  CBC - Abnormal; Notable for the following components:   WBC 19.0 (*)    RBC 4.20 (*)    Hemoglobin 12.4 (*)    HCT 38.8 (*)    All other components within normal limits  LIPASE,  BLOOD  URINALYSIS, ROUTINE W REFLEX MICROSCOPIC    EKG None  Radiology CT ABDOMEN PELVIS W WO CONTRAST  Result Date: 11/04/2020 CLINICAL DATA:  Cancer of unknown primary.  Right-sided pain. EXAM: CT CHEST, ABDOMEN, AND PELVIS WITH CONTRAST TECHNIQUE: Multidetector CT imaging of the  chest, abdomen and pelvis was performed following the standard protocol during bolus administration of intravenous contrast. CONTRAST:  173mL OMNIPAQUE IOHEXOL 300 MG/ML  SOLN COMPARISON:  Ultrasound exam earlier same day. FINDINGS: CT CHEST FINDINGS Cardiovascular: The heart size is normal. No substantial pericardial effusion. Atherosclerotic calcification is noted in the wall of the thoracic aorta. Left permanent pacemaker noted. Mediastinum/Nodes: 14 mm short axis right paratracheal node is noted on 35/3. Precarinal lymphadenopathy measures up to 2.1 cm short axis. 13 mm short axis right hilar node identified. No left hilar lymphadenopathy. There is no axillary lymphadenopathy.The esophagus has normal imaging features. Lungs/Pleura: Centrilobular and paraseptal emphysema evident. 5.9 x 5.1 x 5.3 cm posterior right upper lobe pulmonary mass is contiguous with the posterior aspect of the major fissure. Atelectasis noted in the lower lobes bilaterally. No substantial pleural effusion. Musculoskeletal: No worrisome lytic or sclerotic osseous abnormality. CT ABDOMEN PELVIS FINDINGS Hepatobiliary: Large 12.7 x 11.6 x 12.4 cm irregular multi lobular mass lesion versus collection of clustered smaller lesions identified in the upper liver, largely replacing segments VII and VIII. Lesion shows irregular peripheral enhancement. 2.8 cm subcapsular lesion noted lateral right liver on 51/8. There is probably a tiny 8 mm lesion in the inferior right liver on 73/8. There is no evidence for gallstones, gallbladder wall thickening, or pericholecystic fluid. No intrahepatic or extrahepatic biliary dilation. Pancreas: No focal mass lesion. No  dilatation of the main duct. No intraparenchymal cyst. No peripancreatic edema. Spleen: No splenomegaly. No focal mass lesion. Adrenals/Urinary Tract: No adrenal nodule or mass. Right kidney unremarkable. 8 mm subcapsular lesion lower pole left kidney is too small to characterize. No evidence for hydroureter. The urinary bladder appears normal for the degree of distention. Stomach/Bowel: Stomach is unremarkable. No gastric wall thickening. No evidence of outlet obstruction. Duodenum is normally positioned as is the ligament of Treitz. No small bowel wall thickening. No small bowel dilatation. The terminal ileum is normal. The appendix is not visualized, but there is no edema or inflammation in the region of the cecum. No gross colonic mass. No colonic wall thickening. Vascular/Lymphatic: There is abdominal aortic atherosclerosis without aneurysm. There is no gastrohepatic or hepatoduodenal ligament lymphadenopathy. No retroperitoneal or mesenteric lymphadenopathy. No pelvic sidewall lymphadenopathy. Reproductive: The prostate gland and seminal vesicles are unremarkable. Other: No intraperitoneal free fluid. Musculoskeletal: No worrisome lytic or sclerotic osseous abnormality. IMPRESSION: 1. 5.9 x 5.1 x 5.3 cm posterior right upper lobe pulmonary mass with mediastinal and right hilar lymphadenopathy compatible with lymph node metastases. Pulmonary lesion could represent primary bronchogenic neoplasm or metastatic disease given findings in the liver. 2. 12.7 x 11.6 x 12.4 cm irregular multi lobular mass lesion versus collection of clustered smaller lesions in the upper liver, largely replacing segments VII and VIII. Imaging features highly concerning for neoplasm, likely metastatic disease although primary hepatic neoplasm not excluded. Additional smaller lesions in the liver suggest metastatic involvement. 3. 8 mm subcapsular lesion lower pole left kidney is too small to characterize. Attention on follow-up  recommended. 4. Aortic Atherosclerosis (ICD10-I70.0) and Emphysema (ICD10-J43.9). Electronically Signed   By: Misty Stanley M.D.   On: 11/04/2020 14:52   CT Chest W Contrast  Result Date: 11/04/2020 CLINICAL DATA:  Cancer of unknown primary.  Right-sided pain. EXAM: CT CHEST, ABDOMEN, AND PELVIS WITH CONTRAST TECHNIQUE: Multidetector CT imaging of the chest, abdomen and pelvis was performed following the standard protocol during bolus administration of intravenous contrast. CONTRAST:  154mL OMNIPAQUE IOHEXOL 300 MG/ML  SOLN COMPARISON:  Ultrasound exam  earlier same day. FINDINGS: CT CHEST FINDINGS Cardiovascular: The heart size is normal. No substantial pericardial effusion. Atherosclerotic calcification is noted in the wall of the thoracic aorta. Left permanent pacemaker noted. Mediastinum/Nodes: 14 mm short axis right paratracheal node is noted on 35/3. Precarinal lymphadenopathy measures up to 2.1 cm short axis. 13 mm short axis right hilar node identified. No left hilar lymphadenopathy. There is no axillary lymphadenopathy.The esophagus has normal imaging features. Lungs/Pleura: Centrilobular and paraseptal emphysema evident. 5.9 x 5.1 x 5.3 cm posterior right upper lobe pulmonary mass is contiguous with the posterior aspect of the major fissure. Atelectasis noted in the lower lobes bilaterally. No substantial pleural effusion. Musculoskeletal: No worrisome lytic or sclerotic osseous abnormality. CT ABDOMEN PELVIS FINDINGS Hepatobiliary: Large 12.7 x 11.6 x 12.4 cm irregular multi lobular mass lesion versus collection of clustered smaller lesions identified in the upper liver, largely replacing segments VII and VIII. Lesion shows irregular peripheral enhancement. 2.8 cm subcapsular lesion noted lateral right liver on 51/8. There is probably a tiny 8 mm lesion in the inferior right liver on 73/8. There is no evidence for gallstones, gallbladder wall thickening, or pericholecystic fluid. No intrahepatic or  extrahepatic biliary dilation. Pancreas: No focal mass lesion. No dilatation of the main duct. No intraparenchymal cyst. No peripancreatic edema. Spleen: No splenomegaly. No focal mass lesion. Adrenals/Urinary Tract: No adrenal nodule or mass. Right kidney unremarkable. 8 mm subcapsular lesion lower pole left kidney is too small to characterize. No evidence for hydroureter. The urinary bladder appears normal for the degree of distention. Stomach/Bowel: Stomach is unremarkable. No gastric wall thickening. No evidence of outlet obstruction. Duodenum is normally positioned as is the ligament of Treitz. No small bowel wall thickening. No small bowel dilatation. The terminal ileum is normal. The appendix is not visualized, but there is no edema or inflammation in the region of the cecum. No gross colonic mass. No colonic wall thickening. Vascular/Lymphatic: There is abdominal aortic atherosclerosis without aneurysm. There is no gastrohepatic or hepatoduodenal ligament lymphadenopathy. No retroperitoneal or mesenteric lymphadenopathy. No pelvic sidewall lymphadenopathy. Reproductive: The prostate gland and seminal vesicles are unremarkable. Other: No intraperitoneal free fluid. Musculoskeletal: No worrisome lytic or sclerotic osseous abnormality. IMPRESSION: 1. 5.9 x 5.1 x 5.3 cm posterior right upper lobe pulmonary mass with mediastinal and right hilar lymphadenopathy compatible with lymph node metastases. Pulmonary lesion could represent primary bronchogenic neoplasm or metastatic disease given findings in the liver. 2. 12.7 x 11.6 x 12.4 cm irregular multi lobular mass lesion versus collection of clustered smaller lesions in the upper liver, largely replacing segments VII and VIII. Imaging features highly concerning for neoplasm, likely metastatic disease although primary hepatic neoplasm not excluded. Additional smaller lesions in the liver suggest metastatic involvement. 3. 8 mm subcapsular lesion lower pole left  kidney is too small to characterize. Attention on follow-up recommended. 4. Aortic Atherosclerosis (ICD10-I70.0) and Emphysema (ICD10-J43.9). Electronically Signed   By: Misty Stanley M.D.   On: 11/04/2020 14:52   US Abdomen Complete  Result Date: 11/04/2020 CLINICAL DATA:  Abdominal pain EXAM: ABDOMEN ULTRASOUND COMPLETE COMPARISON:  None. FINDINGS: Gallbladder: No gallstones or wall thickening visualized. No sonographic Murphy sign noted by sonographer. Common bile duct: Diameter: 4.5 mm Liver: The contour the liver appears irregular and mildly nodular. There is a large, solid-appearing mass with heterogeneous echotexture involving the right hepatic lobe which measures 13.6 x 11.6 x 12.7 cm. Portal vein is patent on color Doppler imaging with normal direction of blood flow towards the liver. IVC:  No abnormality visualized. Pancreas: Visualized portion unremarkable. Spleen: Size and appearance within normal limits. Right Kidney: Length: 11.2 cm. Echogenicity within normal limits. No mass or hydronephrosis visualized. Left Kidney: Length: 10.9 cm. Echogenicity within normal limits. No mass or hydronephrosis visualized. Abdominal aorta: No aneurysm visualized. Other findings: None. IMPRESSION: 1. Large, solid-appearing heterogeneous mass involves the right lobe of liver. Findings may represent a benign or malignant neoplasm. Further evaluation with liver protocol CT without and with contrast material. 2. Normal appearance of the gallbladder. Electronically Signed   By: Kerby Moors M.D.   On: 11/04/2020 13:25    Procedures Procedures  Medications Ordered in the ED Medications  morphine 4 MG/ML injection 4 mg (4 mg Intravenous Given 11/04/20 1136)  ondansetron (ZOFRAN-ODT) disintegrating tablet 4 mg (4 mg Oral Given 11/04/20 1135)  iohexol (OMNIPAQUE) 300 MG/ML solution 100 mL (100 mLs Intravenous Contrast Given 11/04/20 1423)     MDM Rules/Calculators/A&P MDM Patient with R sided abdominal  pain, exam concerning for biliary disease moreso than appendix. Will send labs, check [place] and give pain/nausea meds.  ED Course  I have reviewed the triage vital signs and the nursing notes.  Pertinent labs & imaging results that were available during my care of the patient were reviewed by me and considered in my medical decision making (see chart for details).  Clinical Course as of Nov 04 1532  Thu Nov 04, 2020  1127 CBC shows leukocytosis.    [CS]  3845 LFTs mildly elevated, not significantly changed from baseline. Lipase is neg.    [CS]  3646 UA neg.    [CS]  1409 [place] results and images reviewed, discussed with patient/wife at bedside. No known history of malignancy, he is a former smoker, and worse with paint now. Will send for CT to eval mass search for possible primary source of mass if appears metastatic.    [CS]  1510 CT images and results reviewed, discussed with patient. He is going to need close follow up with PCP to coordinate workup for his likely malignancy when the holiday weekend is over. Rx for pain meds if needed. RTED for any other concerns.    [CS]  34 Spoke with Dr. Vernard Gambles, IR who does not recommend stopping Eliquis now given uncertainty on date of potential biopsy. Questions from patient and wife answered.    [CS]    Clinical Course User Index [CS] Truddie Hidden, MD    Final Clinical Impression(s) / ED Diagnoses Final diagnoses:  Lung mass  Liver mass    Rx / DC Orders ED Discharge Orders         Ordered    HYDROcodone-acetaminophen (NORCO/VICODIN) 5-325 MG tablet  Every 4 hours PRN        11/04/20 1532             Truddie Hidden, MD 11/04/20 [id number]

## 2020-11-09 ENCOUNTER — Ambulatory Visit: Payer: BC Managed Care – PPO | Admitting: Internal Medicine

## 2020-11-09 ENCOUNTER — Other Ambulatory Visit: Payer: Self-pay

## 2020-11-09 ENCOUNTER — Telehealth: Payer: Self-pay | Admitting: Internal Medicine

## 2020-11-09 ENCOUNTER — Encounter: Payer: Self-pay | Admitting: Internal Medicine

## 2020-11-09 ENCOUNTER — Encounter (HOSPITAL_COMMUNITY): Payer: Self-pay | Admitting: Radiology

## 2020-11-09 DIAGNOSIS — R160 Hepatomegaly, not elsewhere classified: Secondary | ICD-10-CM | POA: Diagnosis not present

## 2020-11-09 DIAGNOSIS — S61311A Laceration without foreign body of left index finger with damage to nail, initial encounter: Secondary | ICD-10-CM | POA: Diagnosis not present

## 2020-11-09 DIAGNOSIS — I10 Essential (primary) hypertension: Secondary | ICD-10-CM | POA: Diagnosis not present

## 2020-11-09 DIAGNOSIS — I700 Atherosclerosis of aorta: Secondary | ICD-10-CM

## 2020-11-09 DIAGNOSIS — R918 Other nonspecific abnormal finding of lung field: Secondary | ICD-10-CM

## 2020-11-09 DIAGNOSIS — E785 Hyperlipidemia, unspecified: Secondary | ICD-10-CM

## 2020-11-09 LAB — PROTIME-INR
INR: 1.9 ratio — ABNORMAL HIGH (ref 0.8–1.0)
Prothrombin Time: 21.1 s — ABNORMAL HIGH (ref 9.6–13.1)

## 2020-11-09 MED ORDER — HYDROCODONE-ACETAMINOPHEN 5-325 MG PO TABS: 1.0000 | ORAL_TABLET | ORAL | 0 refills | Status: DC | PRN

## 2020-11-09 MED ORDER — HYDROCODONE-HOMATROPINE 5-1.5 MG/5ML PO SYRP
5.0000 mL | ORAL_SOLUTION | Freq: Four times a day (QID) | ORAL | 0 refills | Status: DC | PRN
Start: 2020-11-09 — End: 2020-11-10

## 2020-11-09 NOTE — Telephone Encounter (Signed)
HYDROcodone-homatropine (HYCODAN) 5-1.5 MG/5ML syrup Pharmacy calling stating this medication is national back order and is wondering if the medication can be changed to something else.  [phone] [id number] - [place], [place] Phone:  [phone]  Fax:  [phone]

## 2020-11-09 NOTE — Progress Notes (Signed)
Daniel Richard Male, 64 y.o., December 13, 1955 MRN:  [id number] Phone:  [phone] (M) ... PCP:  Biagio Borg, MD Coverage:  Sherre Poot [employer] Ppo Next Appt With Cardiology 01/25/2021 at 8:25 AM  RE: CT Biopsy Received: Today Suttle, Rosanne Ashing, MD  [name] Approved for ultrasound guided right lobe mass biopsy. Most logical starting point given severe paraseptal emphysema and risk of complication with lung biopsy. Several right hilar nodes which could be sampled via EBUS at lower risk. If liver results as lung metastasis, no need for lung biopsy. If liver is primary or of other etiology, recommend evaluation for EBUS, and if that is unsuccessful then high risk lung biopsy with CT guidance can be considered.   Dylan       Previous Messages   ----- Message -----  From: [name]  Sent: 11/09/2020  4:11 PM EST  To: [facility] Procedure Requests  Subject: CT Biopsy                     Procedure: CT Biopsy   Reason: Liver mass, Pulmonary mass, liver mass and pulm mass - for ct guided liver biopsy   History:  CT, [place] in computer   Provider: Biagio Borg   Provider Contact: [phone]

## 2020-11-09 NOTE — Progress Notes (Signed)
Subjective:    Patient ID: Daniel Richard, male    DOB: Nov 20, 1956, 64 y.o.   MRN: [id number]  HPI  Here to f/u post Ed visit with abnormal CT , pt very surprised and states overall feels fine, just shocked.  Does still have some mild RUQ pain, wants to know the plan for further evaluation.  Pt denies chest pain, increased sob or doe, wheezing, orthopnea, PND, increased LE swelling, palpitations, dizziness or syncope.  Denies worsening reflux, other abd pain, dysphagia, n/v, bowel change or blood.  Pt denies new neurological symptoms such as new headache, or facial or extremity weakness or numbness   Pt denies polydipsia, polyuria, Past Medical History:  Diagnosis Date  . AICD (automatic cardioverter/defibrillator) present    ST JUDE  . ALLERGIC RHINITIS   . ANXIETY   . Arthritis    IN FINGERS  . Cardiac arrest ([place]) 11/21/2015  . Cardiac device in situ   . Coronary artery disease   . HYPERLIPIDEMIA   . HYPERTENSION   . LOW BACK PAIN   . Myocardial infarction ([place]) 11/21/2015  . Paroxysmal atrial fibrillation [facility])    Past Surgical History:  Procedure Laterality Date  . CARDIAC CATHETERIZATION N/A 11/21/2015   Procedure: Left Heart Cath and Coronary Angiography;  Surgeon: Wellington Hampshire, MD;  Location: [facility];  Service: Cardiovascular;  Laterality: N/A;  . EP IMPLANTABLE DEVICE N/A 11/29/2015   Procedure: ICD Implant;  Surgeon: Deboraha Sprang, MD;  Location: [place] [facility];  Service: Cardiovascular;  Laterality: N/A;  . INGUINAL HERNIA REPAIR Left 02/09/2017   Procedure: OPEN REPAIR LEFT INGUINAL HERNIA ;  Surgeon: Greer Pickerel, MD;  Location: [facility];  Service: General;  Laterality: Left;  . INSERTION OF MESH Left 02/09/2017   Procedure: INSERTION OF MESH;  Surgeon: Greer Pickerel, MD;  Location: [facility];  Service: General;  Laterality: Left;    reports that he quit smoking about 12 years ago. His smoking use included cigarettes. He has a 20.00 pack-year smoking  history. He has never used smokeless tobacco. He reports current alcohol use. He reports that he does not use drugs. family history includes Hypertension in an other family member. Allergies  Allergen Reactions  . Lipitor [Atorvastatin]     myalgia  . Meperidine Hcl Other (See Comments)    "TURNED WHITE AS A SHEEP, SWEATY COULD NOT STAND" DEMEROL   Current Outpatient Medications on File Prior to Visit  Medication Sig Dispense Refill  . b complex vitamins tablet Take 1 tablet by mouth daily.    . carvedilol (COREG) 25 MG tablet TAKE 0.5 TABLETS (12.5 MG TOTAL) BY MOUTH 2 (TWO) TIMES DAILY WITH A MEAL. 90 tablet 1  . clindamycin (CLEOCIN) 300 MG capsule Take 1 capsule (300 mg total) by mouth 4 (four) times daily. 28 capsule 0  . diltiazem (CARDIZEM CD) 180 MG 24 hr capsule Take 1 capsule (180 mg total) by mouth daily. 90 capsule 3  . ELIQUIS 5 MG TABS tablet TAKE 1 TABLET BY MOUTH TWICE A DAY 180 tablet 2  . eszopiclone (LUNESTA) 2 MG TABS tablet Take 1 tablet (2 mg total) by mouth at bedtime as needed for sleep. Take immediately before bedtime 30 tablet 5  . fluticasone (FLONASE) 50 MCG/ACT nasal spray Place into the nose.    . ibuprofen (ADVIL,MOTRIN) 200 MG tablet Take 200 mg by mouth every 6 (six) hours as needed (Pain).    . Multiple Vitamins-Minerals (MULTIVITAMIN WITH MINERALS)  tablet Take 1 tablet by mouth daily.    Marland Kitchen olmesartan-hydrochlorothiazide (BENICAR HCT) 40-12.5 MG tablet Take 1 tablet by mouth daily. 90 tablet 3  . rosuvastatin (CRESTOR) 40 MG tablet TAKE 1 TABLET BY MOUTH EVERY DAY 90 tablet 1   No current facility-administered medications on file prior to visit.   Review of Systems All otherwise neg per pt    Objective:   Physical Exam BP 120/80 (BP Location: Left Arm, Patient Position: Sitting, Cuff Size: Large)   Pulse (!) 110   Temp 98.5 F (36.9 C) (Oral)   Ht 6' (1.829 m)   Wt 174 lb (78.9 kg)   SpO2 95%   BMI 23.60 kg/m  VS noted,  Constitutional: Pt  appears in NAD HENT: Head: NCAT.  Right Ear: External ear normal.  Left Ear: External ear normal.  Eyes: . Pupils are equal, round, and reactive to light. Conjunctivae and EOM are normal Nose: without d/c or deformity Neck: Neck supple. Gross normal ROM Cardiovascular: Normal rate and regular rhythm.   Pulmonary/Chest: Effort normal and breath sounds without rales or wheezing.  Abd:  Soft, NT, ND, + BS, no organomegaly Neurological: Pt is alert. At baseline orientation, motor grossly intact Skin: Skin is warm. No rashes, other new lesions, no LE edema Psychiatric: Pt behavior is normal without agitation  All otherwise neg per pt Lab Results  Component Value Date   WBC 19.0 (H) 11/04/2020   HGB 12.4 (L) 11/04/2020   HCT 38.8 (L) 11/04/2020   PLT 370 11/04/2020   GLUCOSE 118 (H) 11/04/2020   CHOL 124 10/13/2020   TRIG 64.0 10/13/2020   HDL 37.10 (L) 10/13/2020   LDLDIRECT 108.0 03/02/2016   LDLCALC 74 10/13/2020   ALT 55 (H) 11/04/2020   AST 67 (H) 11/04/2020   NA 129 (L) 11/04/2020   K 4.4 11/04/2020   CL 94 (L) 11/04/2020   CREATININE 0.90 11/04/2020   BUN 10 11/04/2020   CO2 24 11/04/2020   TSH 0.94 10/13/2020   PSA 0.62 10/13/2020   INR 1.9 (H) 11/09/2020   HGBA1C 6.2 09/30/2019    CT chest/abd/pelvis Nov 04 2020 IMPRESSION: 1. 5.9 x 5.1 x 5.3 cm posterior right upper lobe pulmonary mass with mediastinal and right hilar lymphadenopathy compatible with lymph node metastases. Pulmonary lesion could represent primary bronchogenic neoplasm or metastatic disease given findings in the liver. 2. 12.7 x 11.6 x 12.4 cm irregular multi lobular mass lesion versus collection of clustered smaller lesions in the upper liver, largely replacing segments VII and VIII. Imaging features highly concerning for neoplasm, likely metastatic disease although primary hepatic neoplasm not excluded. Additional smaller lesions in the liver suggest metastatic involvement. 3. 8 mm subcapsular  lesion lower pole left kidney is too small to characterize. Attention on follow-up recommended. 4. Aortic Atherosclerosis (ICD10-I70.0) and Emphysema (ICD10-J43.9).     Assessment & Plan:

## 2020-11-09 NOTE — Patient Instructions (Addendum)
Please take all new medication as prescribed - the cough med and pain medication as needed  Please continue all other medications as before, and refills have been done if requested.  Please have the pharmacy call with any other refills you may need.  Please continue your efforts at being more active, low cholesterol diet, and weight control.  Please keep your appointments with your specialists as you may have planned  You will be contacted regarding the referral for: CT biopsy, and Oncology   Please go to the LAB at the blood drawing area for the tests to be done - just the INR blood test today  You will be contacted by phone if any changes need to be made immediately.  Otherwise, you will receive a letter about your results with an explanation, but please check with MyChart first.  Please remember to sign up for MyChart if you have not done so, as this will be important to you in the future with finding out test results, communicating by private email, and scheduling acute appointments online when needed.

## 2020-11-10 ENCOUNTER — Encounter (HOSPITAL_COMMUNITY): Payer: Self-pay | Admitting: Radiology

## 2020-11-10 MED ORDER — PROMETHAZINE-CODEINE 6.25-10 MG/5ML PO SYRP
5.0000 mL | ORAL_SOLUTION | ORAL | 0 refills | Status: AC | PRN
Start: 2020-11-10 — End: 2020-11-20

## 2020-11-10 NOTE — Progress Notes (Signed)
Daniel Richard Male, 64 y.o., May 11, 1956 MRN:  [id number] Phone:  [phone] (M) ... PCP:  Biagio Borg, MD Coverage:  Sherre Poot [employer] Ppo Next Appt With Cardiology 01/25/2021 at 8:25 AM  RE: CT Biopsy Received: Riley Kill, MD  [name] Yes, ok to hold x 2 days       Previous Messages   ----- Message -----  From: [name]  Sent: 11/09/2020  4:16 PM EST  To: Biagio Borg, MD  Subject: FW: CT Biopsy                   Good Afternoon, patient is on Eliquis and will need to hold for 2 days prior to biopsy. Please advise if okay to hold. Thanks Aniceto Boss  ----- Message -----  From: [name]  Sent: 11/09/2020  4:11 PM EST  To: Ir Procedure Requests  Subject: CT Biopsy                     Procedure: CT Biopsy   Reason: Liver mass, Pulmonary mass, liver mass and pulm mass - for ct guided liver biopsy   History:  CT, [place] in computer   Provider: Biagio Borg   Provider Contact: [phone]

## 2020-11-10 NOTE — Telephone Encounter (Signed)
[place] for change to phenergan w/codein - done erx

## 2020-11-14 ENCOUNTER — Encounter: Payer: Self-pay | Admitting: Internal Medicine

## 2020-11-14 NOTE — Assessment & Plan Note (Signed)
stable overall by history and exam, recent data reviewed with pt, and pt to continue medical treatment as before,  to f/u any worsening symptoms or concerns  

## 2020-11-14 NOTE — Assessment & Plan Note (Addendum)
?   Primary,  For cough med, to f/u any worsening symptoms or concerns  I spent 41 minutes in preparing to see the patient by review of recent labs, imaging and procedures, obtaining and reviewing separately obtained history, communicating with the patient and family or caregiver, ordering medications, tests or procedures, and documenting clinical information in the EHR including the differential Dx, treatment, and any further evaluation and other management of pulm mass/liver mass, aortic atherosclerosis, hld, htn

## 2020-11-14 NOTE — Assessment & Plan Note (Addendum)
Needs diagnosis, for liver bx, pain control, refer oncology

## 2020-11-14 NOTE — Assessment & Plan Note (Signed)
To cont crestor, low chol diet

## 2020-11-18 ENCOUNTER — Other Ambulatory Visit: Payer: Self-pay | Admitting: Radiology

## 2020-11-18 ENCOUNTER — Other Ambulatory Visit: Payer: Self-pay | Admitting: Student

## 2020-11-19 ENCOUNTER — Ambulatory Visit (HOSPITAL_COMMUNITY)
Admission: RE | Admit: 2020-11-19 | Discharge: 2020-11-19 | Disposition: A | Discharge status: Home / Self Care | Payer: BC Managed Care – PPO | Source: Ambulatory Visit | Attending: Internal Medicine | Admitting: Internal Medicine

## 2020-11-19 ENCOUNTER — Other Ambulatory Visit: Payer: Self-pay

## 2020-11-19 DIAGNOSIS — I10 Essential (primary) hypertension: Secondary | ICD-10-CM | POA: Diagnosis not present

## 2020-11-19 DIAGNOSIS — R918 Other nonspecific abnormal finding of lung field: Secondary | ICD-10-CM | POA: Diagnosis not present

## 2020-11-19 DIAGNOSIS — Z87891 Personal history of nicotine dependence: Secondary | ICD-10-CM | POA: Diagnosis not present

## 2020-11-19 DIAGNOSIS — R160 Hepatomegaly, not elsewhere classified: Secondary | ICD-10-CM

## 2020-11-19 DIAGNOSIS — I252 Old myocardial infarction: Secondary | ICD-10-CM | POA: Diagnosis not present

## 2020-11-19 DIAGNOSIS — C227 Other specified carcinomas of liver: Secondary | ICD-10-CM | POA: Diagnosis not present

## 2020-11-19 DIAGNOSIS — Z7901 Long term (current) use of anticoagulants: Secondary | ICD-10-CM | POA: Diagnosis not present

## 2020-11-19 DIAGNOSIS — Z8249 Family history of ischemic heart disease and other diseases of the circulatory system: Secondary | ICD-10-CM | POA: Diagnosis not present

## 2020-11-19 DIAGNOSIS — I480 Paroxysmal atrial fibrillation: Secondary | ICD-10-CM | POA: Insufficient documentation

## 2020-11-19 DIAGNOSIS — C229 Malignant neoplasm of liver, not specified as primary or secondary: Secondary | ICD-10-CM | POA: Diagnosis not present

## 2020-11-19 DIAGNOSIS — Z79899 Other long term (current) drug therapy: Secondary | ICD-10-CM | POA: Insufficient documentation

## 2020-11-19 DIAGNOSIS — Z95810 Presence of automatic (implantable) cardiac defibrillator: Secondary | ICD-10-CM | POA: Diagnosis not present

## 2020-11-19 DIAGNOSIS — E785 Hyperlipidemia, unspecified: Secondary | ICD-10-CM | POA: Diagnosis not present

## 2020-11-19 LAB — CBC
HCT: 35.2 % — ABNORMAL LOW (ref 39.0–52.0)
Hemoglobin: 10.9 g/dL — ABNORMAL LOW (ref 13.0–17.0)
MCH: 28.5 pg (ref 26.0–34.0)
MCHC: 31 g/dL (ref 30.0–36.0)
MCV: 91.9 fL (ref 80.0–100.0)
Platelets: 530 10*3/uL — ABNORMAL HIGH (ref 150–400)
RBC: 3.83 MIL/uL — ABNORMAL LOW (ref 4.22–5.81)
RDW: 14.2 % (ref 11.5–15.5)
WBC: 15.9 10*3/uL — ABNORMAL HIGH (ref 4.0–10.5)
nRBC: 0 % (ref 0.0–0.2)

## 2020-11-19 LAB — PROTIME-INR
INR: 1.4 — ABNORMAL HIGH (ref 0.8–1.2)
Prothrombin Time: 16.2 seconds — ABNORMAL HIGH (ref 11.4–15.2)

## 2020-11-19 MED ORDER — LIDOCAINE HCL 1 % IJ SOLN
INTRAMUSCULAR | Status: AC | PRN
  Administered 2020-11-19: 10 mL

## 2020-11-19 MED ORDER — MIDAZOLAM HCL 2 MG/2ML IJ SOLN
INTRAMUSCULAR | Status: AC
  Filled 2020-11-19: qty 2

## 2020-11-19 MED ORDER — MIDAZOLAM HCL 2 MG/2ML IJ SOLN
INTRAMUSCULAR | Status: AC | PRN
  Administered 2020-11-19: 1 mg via INTRAVENOUS

## 2020-11-19 MED ORDER — SODIUM CHLORIDE 0.9 % IV SOLN: INTRAVENOUS | Status: DC

## 2020-11-19 MED ORDER — FENTANYL CITRATE (PF) 100 MCG/2ML IJ SOLN
INTRAMUSCULAR | Status: AC | PRN
  Administered 2020-11-19: 50 ug via INTRAVENOUS

## 2020-11-19 MED ORDER — FENTANYL CITRATE (PF) 100 MCG/2ML IJ SOLN
INTRAMUSCULAR | Status: AC
  Filled 2020-11-19: qty 2

## 2020-11-19 MED ORDER — GELATIN ABSORBABLE 12-7 MM EX MISC
CUTANEOUS | Status: AC
  Filled 2020-11-19: qty 1

## 2020-11-19 MED ORDER — LIDOCAINE HCL (PF) 1 % IJ SOLN
INTRAMUSCULAR | Status: AC
  Filled 2020-11-19: qty 30

## 2020-11-19 NOTE — Discharge Instructions (Addendum)
Liver Biopsy  The liver is a large organ in the upper right side of the abdomen. A liver biopsy is a procedure in which a tissue sample is taken from the liver and examined under a microscope. There are three types of liver biopsies:  Percutaneous. A needle is used to remove a sample through an incision in your abdomen.  Laparoscopic. Several incisions are made in the abdomen. A sample is removed with the help of a tiny camera.  Transjugular. An incision is made in your neck in the area of the jugular vein. A sample is removed through a small flexible tube that is passed down the blood vessel and into your liver. Tell a health care provider about:  Any allergies you have.  All medicines you are taking, including vitamins, herbs, eye drops, creams, and over-the-counter medicines.  Any problems you or family members have had with anesthetic medicines.  Any blood disorders you have.  Any surgeries you have had.  Any medical conditions you have.  Whether you are pregnant or may be pregnant. What are the risks? Generally, this is a safe procedure. However, problems can occur and include:  Bleeding.  Infection.  Bruising.  Pain.  Injury to nearby organs or tissues, such as nerves, gallbladder, liver, or lungs. What happens before the procedure? Eating and drinking restrictions  You may be asked not to drink or eat for 6-8 hours before the liver biopsy. You may be allowed to eat a light breakfast. Talk to your health care provider about when you should stop eating and drinking. Medicines Ask your health care provider about:  Changing or stopping your regular medicines. This is especially important if you are taking diabetes medicines or blood thinners.  Taking medicines such as aspirin and ibuprofen. These medicines can thin your blood. Do not take these medicines unless your health care provider tells you to take them.  Taking over-the-counter medicines, vitamins, herbs, and  supplements. General instructions  Do not use any products that contain nicotine or tobacco, such as cigarettes and e-cigarettes. If you need help quitting, ask your health care provider.  Plan to have someone take you home from the hospital or clinic.  Plan to have a responsible adult care for you for at least 24 hours after you leave the hospital or clinic. This is important.  You may have blood or urine tests.  Ask your health care provider what steps will be taken to prevent infection. These may include: ? Removing hair at the surgery site. ? Washing skin with a germ-killing soap. ? Taking antibiotic medicine. What happens during the procedure?  An IV will be inserted into one of your veins. ? You will be given one or more of the following: ? A medicine to help you relax (sedative). ? A medicine to numb the area (local anesthetic). ? A medicine to make you fall asleep (general anesthetic).  Your health care provider will use one of the following procedures to remove samples from your liver. These procedures may vary among health care providers and hospitals. Percutaneous liver biopsy  You will lie on your back, with your right hand over your head.  A health care provider will locate your liver by tapping and pressing on the right side of your abdomen, or by using an ultrasound or CT scan.  A local anesthetic will be used to numb an area at the bottom of your last right rib.  A small incision will be made in the numbed area.    A biopsy needle will be inserted into the incision.  Several samples of liver tissue will be taken. You will be asked to hold your breath as each sample is taken.  The incision will be closed with stitches (sutures).  A bandage (dressing) may be placed over the incision. Laparoscopic liver biopsy  You will lie on your back.  Several small incisions will be made in your abdomen.  Your health care provider will pass a tiny camera through one  incision. The camera will allow the liver to be viewed on a TV monitor in the operating room.  Tools will be passed through the other incision or incisions.  Samples of the liver will be removed using the tools.  The incisions will be closed with stitches (sutures).  A bandage (dressing) may be placed over the incisions. Transjugular liver biopsy  You will lie on your back on an X-ray table, with your head turned to your left.  An area on your neck, just over your jugular vein, will be numbed.  An incision will be made in the numbed area.  A tiny tube will be inserted through the incision. The tube will be passed into the jugular vein to a blood vessel in the liver called the hepatic vein.  A dye will be injected through the tube.  X-rays will be taken. The dye will make the blood vessels in the liver light up on the X-rays.  The biopsy needle will be placed through the tube until it reaches the liver.  Samples of liver tissue will be taken with the biopsy needle.  The needle and the tube will be removed.  The incision will be closed with stitches (sutures).  A bandage (dressing) may be placed over the incision. What happens after the procedure?  Your blood pressure, heart rate, breathing rate, and blood oxygen level will be monitored until you leave the hospital or clinic.  You will be asked to rest quietly for 2-4 hours or longer.  You will be closely monitored for bleeding from the biopsy site.  You may be allowed to go home when the medicines have worn off and you can walk, drink, eat, and use the bathroom. Summary  A liver biopsy is a procedure in which a tissue sample is taken from the liver and examined under a microscope.  This is a safe procedure, but problems can occur, including bleeding, infection, pain, or injury to nearby organs or tissues.  Ask your health care provider about changing or stopping your regular medicines.  Plan to have someone take you  home from the hospital or clinic and to be with you for 24 hours after the procedure. This information is not intended to replace advice given to you by your health care provider. Make sure you discuss any questions you have with your health care provider. Document Revised: 12/07/2017 Document Reviewed: 12/07/2017 Elsevier Patient Education  2020 [employer] Moderate Conscious Sedation, Adult Sedation is the use of medicines to promote relaxation and relieve discomfort and anxiety. Moderate conscious sedation is a type of sedation. Under moderate conscious sedation, you are less alert than normal, but you are still able to respond to instructions, touch, or both. Moderate conscious sedation is used during short medical and dental procedures. It is milder than deep sedation, which is a type of sedation under which you cannot be easily woken up. It is also milder than general anesthesia, which is the use of medicines to make you unconscious. Moderate conscious sedation   allows you to return to your regular activities sooner. Tell a health care provider about:  Any allergies you have.  All medicines you are taking, including vitamins, herbs, eye drops, creams, and over-the-counter medicines.  Use of steroids (by mouth or creams).  Any problems you or family members have had with sedatives and anesthetic medicines.  Any blood disorders you have.  Any surgeries you have had.  Any medical conditions you have, such as sleep apnea.  Whether you are pregnant or may be pregnant.  Any use of cigarettes, alcohol, marijuana, or street drugs. What are the risks? Generally, this is a safe procedure. However, problems may occur, including:  Getting too much medicine (oversedation).  Nausea.  Allergic reaction to medicines.  Trouble breathing. If this happens, a breathing tube may be used to help with breathing. It will be removed when you are awake and breathing on your own.  Heart  trouble.  Lung trouble. What happens before the procedure? Staying hydrated Follow instructions from your health care provider about hydration, which may include:  Up to 2 hours before the procedure - you may continue to drink clear liquids, such as water, clear fruit juice, black coffee, and plain tea. Eating and drinking restrictions Follow instructions from your health care provider about eating and drinking, which may include:  8 hours before the procedure - stop eating heavy meals or foods such as meat, fried foods, or fatty foods.  6 hours before the procedure - stop eating light meals or foods, such as toast or cereal.  6 hours before the procedure - stop drinking milk or drinks that contain milk.  2 hours before the procedure - stop drinking clear liquids. Medicine Ask your health care provider about:  Changing or stopping your regular medicines. This is especially important if you are taking diabetes medicines or blood thinners.  Taking medicines such as aspirin and ibuprofen. These medicines can thin your blood. Do not take these medicines before your procedure if your health care provider instructs you not to.  Tests and exams  You will have a physical exam.  You may have blood tests done to show: ? How well your kidneys and liver are working. ? How well your blood can clot. General instructions  Plan to have someone take you home from the hospital or clinic.  If you will be going home right after the procedure, plan to have someone with you for 24 hours. What happens during the procedure?  An IV tube will be inserted into one of your veins.  Medicine to help you relax (sedative) will be given through the IV tube.  The medical or dental procedure will be performed. What happens after the procedure?  Your blood pressure, heart rate, breathing rate, and blood oxygen level will be monitored often until the medicines you were given have worn off.  Do not drive  for 24 hours. This information is not intended to replace advice given to you by your health care provider. Make sure you discuss any questions you have with your health care provider. Document Revised: 11/09/2017 Document Reviewed: 03/18/2016 Elsevier Patient Education  2020 Elsevier Inc.  

## 2020-11-19 NOTE — Procedures (Signed)
Interventional Radiology Procedure Note  Procedure: [place] right liver mass core bx    Complications: None  Estimated Blood Loss:  min  Findings: 18 g core bxs x 3    M. Daryll Brod, MD

## 2020-11-19 NOTE — H&P (Signed)
Chief Complaint: Liver lesion. Request is for liver biopsy   Referring Physician(s): Biagio Borg  Supervising Physician: Daryll Brod  Patient Status: [facility] - Out-pt  History of Present Illness: Daniel Richard is a 64 y.o. male 64 y.o. male outpatient. History of HTN, HLD, aortic atherosclerosis a fib ( on eliquis) MI s/p ICD inguinal hernia s/p mesh repair on 3.2.18. Found to have a liver and lung mass while being worked up for right sided abdominal pain. CT abdomen from 11.25.21 reads 12.7 x 11.6 x 12.4 cm irregular multi lobular mass lesion versus collection of clustered smaller lesions in the upper liver, largely replacing segments VII and VIII. Imaging features highly concerning for neoplasm, likely metastatic disease although primary hepatic neoplasm not excluded. Additional smaller lesions in the liver suggest metastatic involvement.  Request is for a Liver biopsy for further evaluation of possible primary.   Patient reports intermittent flank pain without nausea or vomiting X several months with several medical appointments that were unremarkable. On Thanksgiving patient had flank pain so severe that he could not stand. He went to the ED where the CT Abdomen was performed with the findings. Currently without any significant complaints. Patient alert and laying in bed, calm and comfortable. Denies any fevers, headache, chest pain, SOB, cough, nausea, vomiting or bleeding. Patient endorses tenderness with palpation to right flank. Return precautions and treatment recommendations and follow-up discussed with the patient who is agreeable with the plan.     Past Medical History:  Diagnosis Date  . AICD (automatic cardioverter/defibrillator) present    ST JUDE  . ALLERGIC RHINITIS   . ANXIETY   . Arthritis    IN FINGERS  . Cardiac arrest ([place]) 11/21/2015  . Cardiac device in situ   . Coronary artery disease   . HYPERLIPIDEMIA   . HYPERTENSION   . LOW BACK PAIN   .  Myocardial infarction ([place]) 11/21/2015  . Paroxysmal atrial fibrillation [facility])     Past Surgical History:  Procedure Laterality Date  . CARDIAC CATHETERIZATION N/A 11/21/2015   Procedure: Left Heart Cath and Coronary Angiography;  Surgeon: Wellington Hampshire, MD;  Location: [facility];  Service: Cardiovascular;  Laterality: N/A;  . EP IMPLANTABLE DEVICE N/A 11/29/2015   Procedure: ICD Implant;  Surgeon: Deboraha Sprang, MD;  Location: [place] [facility];  Service: Cardiovascular;  Laterality: N/A;  . INGUINAL HERNIA REPAIR Left 02/09/2017   Procedure: OPEN REPAIR LEFT INGUINAL HERNIA ;  Surgeon: Greer Pickerel, MD;  Location: [facility];  Service: General;  Laterality: Left;  . INSERTION OF MESH Left 02/09/2017   Procedure: INSERTION OF MESH;  Surgeon: Greer Pickerel, MD;  Location: [facility];  Service: General;  Laterality: Left;    Allergies: Lipitor [atorvastatin] and Meperidine hcl  Medications: Prior to Admission medications   Medication Sig Start Date End Date Taking? Authorizing Provider  carvedilol (COREG) 25 MG tablet TAKE 0.5 TABLETS (12.5 MG TOTAL) BY MOUTH 2 (TWO) TIMES DAILY WITH A MEAL. 08/09/20  Yes Biagio Borg, MD  cholecalciferol (VITAMIN D3) 25 MCG (1000 UNIT) tablet Take 3,000 Units by mouth daily.   Yes [provider]  diltiazem (CARDIZEM CD) 180 MG 24 hr capsule Take 1 capsule (180 mg total) by mouth daily. 10/13/20  Yes Biagio Borg, MD  HYDROcodone-acetaminophen (NORCO/VICODIN) 5-325 MG tablet Take 1 tablet by mouth every 4 (four) hours as needed for moderate pain. 11/09/20  Yes Biagio Borg, MD  ibuprofen (ADVIL,MOTRIN) 200 MG tablet  Take 200 mg by mouth every 6 (six) hours as needed (Pain).   Yes [provider]  Multiple Vitamins-Minerals (MULTIVITAMIN WITH MINERALS) tablet Take 1 tablet by mouth daily.   Yes [provider]  rosuvastatin (CRESTOR) 40 MG tablet TAKE 1 TABLET BY MOUTH EVERY DAY Patient taking differently: Take 40 mg by mouth  daily. 10/18/20  Yes Biagio Borg, MD  clindamycin (CLEOCIN) 300 MG capsule Take 1 capsule (300 mg total) by mouth 4 (four) times daily. Patient not taking: Reported on 11/16/2020 10/26/20   Cuthriell, Roderic Palau D, PA-C  ELIQUIS 5 MG TABS tablet TAKE 1 TABLET BY MOUTH TWICE A DAY Patient taking differently: Take 5 mg by mouth 2 (two) times daily. 03/09/20   Biagio Borg, MD  promethazine-codeine (PHENERGAN WITH CODEINE) 6.25-10 MG/5ML syrup Take 5 mLs by mouth every 4 (four) hours as needed for up to 10 days for cough. 11/10/20 11/20/20  Biagio Borg, MD     Family History  Problem Relation Age of Onset  . Hypertension Other     Social History   Socioeconomic History  . Marital status: Married    Spouse name: Not on file  . Number of children: Not on file  . Years of education: Not on file  . Highest education level: Not on file  Occupational History  . Occupation: Maintenance/painting     Employer: [employer]  Tobacco Use  . Smoking status: Former Smoker    Packs/day: 1.00    Years: 20.00    Pack years: 20.00    Types: Cigarettes    Quit date: 12/12/2007    Years since quitting: 12.9  . Smokeless tobacco: Never Used  Substance and Sexual Activity  . Alcohol use: Yes    Comment: occ  . Drug use: No  . Sexual activity: Not on file  Other Topics Concern  . Not on file  Social History Narrative   Lives with wife and teenage daughter   Social Determinants of Health   Financial Resource Strain: Not on file  Food Insecurity: Not on file  Transportation Needs: Not on file  Physical Activity: Not on file  Stress: Not on file  Social Connections: Not on file     Review of Systems: A 12 point ROS discussed and pertinent positives are indicated in the HPI above.  All other systems are negative.  Review of Systems  Constitutional: Negative for fever.  HENT: Negative for congestion.   Respiratory: Negative for cough and shortness of breath.   Cardiovascular:  Negative for chest pain.  Gastrointestinal: Positive for abdominal pain ( right flank worse with palpation. ).  Neurological: Negative for headaches.  Psychiatric/Behavioral: Negative for behavioral problems and confusion.    Vital Signs: BP 121/84   Pulse 73   Temp 98.1 F (36.7 C) (Oral)   Resp 14   Ht 6' (1.829 m)   Wt 170 lb (77.1 kg)   SpO2 99%   BMI 23.06 kg/m   Physical Exam Vitals and nursing note reviewed.  Constitutional:      Appearance: He is well-developed and well-nourished.  HENT:     Head: Normocephalic.  Cardiovascular:     Rate and Rhythm: Normal rate and regular rhythm.     Heart sounds: Normal heart sounds.  Pulmonary:     Effort: Pulmonary effort is normal.     Breath sounds: Normal breath sounds.  Musculoskeletal:        General: Normal range of motion.  Cervical back: Normal range of motion.  Skin:    General: Skin is dry.  Neurological:     Mental Status: He is alert and oriented to person, place, and time.  Psychiatric:        Mood and Affect: Mood and affect normal.     Imaging: CT ABDOMEN PELVIS W WO CONTRAST  Result Date: 11/04/2020 CLINICAL DATA:  Cancer of unknown primary.  Right-sided pain. EXAM: CT CHEST, ABDOMEN, AND PELVIS WITH CONTRAST TECHNIQUE: Multidetector CT imaging of the chest, abdomen and pelvis was performed following the standard protocol during bolus administration of intravenous contrast. CONTRAST:  1109mL OMNIPAQUE IOHEXOL 300 MG/ML  SOLN COMPARISON:  Ultrasound exam earlier same day. FINDINGS: CT CHEST FINDINGS Cardiovascular: The heart size is normal. No substantial pericardial effusion. Atherosclerotic calcification is noted in the wall of the thoracic aorta. Left permanent pacemaker noted. Mediastinum/Nodes: 14 mm short axis right paratracheal node is noted on 35/3. Precarinal lymphadenopathy measures up to 2.1 cm short axis. 13 mm short axis right hilar node identified. No left hilar lymphadenopathy. There is no  axillary lymphadenopathy.The esophagus has normal imaging features. Lungs/Pleura: Centrilobular and paraseptal emphysema evident. 5.9 x 5.1 x 5.3 cm posterior right upper lobe pulmonary mass is contiguous with the posterior aspect of the major fissure. Atelectasis noted in the lower lobes bilaterally. No substantial pleural effusion. Musculoskeletal: No worrisome lytic or sclerotic osseous abnormality. CT ABDOMEN PELVIS FINDINGS Hepatobiliary: Large 12.7 x 11.6 x 12.4 cm irregular multi lobular mass lesion versus collection of clustered smaller lesions identified in the upper liver, largely replacing segments VII and VIII. Lesion shows irregular peripheral enhancement. 2.8 cm subcapsular lesion noted lateral right liver on 51/8. There is probably a tiny 8 mm lesion in the inferior right liver on 73/8. There is no evidence for gallstones, gallbladder wall thickening, or pericholecystic fluid. No intrahepatic or extrahepatic biliary dilation. Pancreas: No focal mass lesion. No dilatation of the main duct. No intraparenchymal cyst. No peripancreatic edema. Spleen: No splenomegaly. No focal mass lesion. Adrenals/Urinary Tract: No adrenal nodule or mass. Right kidney unremarkable. 8 mm subcapsular lesion lower pole left kidney is too small to characterize. No evidence for hydroureter. The urinary bladder appears normal for the degree of distention. Stomach/Bowel: Stomach is unremarkable. No gastric wall thickening. No evidence of outlet obstruction. Duodenum is normally positioned as is the ligament of Treitz. No small bowel wall thickening. No small bowel dilatation. The terminal ileum is normal. The appendix is not visualized, but there is no edema or inflammation in the region of the cecum. No gross colonic mass. No colonic wall thickening. Vascular/Lymphatic: There is abdominal aortic atherosclerosis without aneurysm. There is no gastrohepatic or hepatoduodenal ligament lymphadenopathy. No retroperitoneal or  mesenteric lymphadenopathy. No pelvic sidewall lymphadenopathy. Reproductive: The prostate gland and seminal vesicles are unremarkable. Other: No intraperitoneal free fluid. Musculoskeletal: No worrisome lytic or sclerotic osseous abnormality. IMPRESSION: 1. 5.9 x 5.1 x 5.3 cm posterior right upper lobe pulmonary mass with mediastinal and right hilar lymphadenopathy compatible with lymph node metastases. Pulmonary lesion could represent primary bronchogenic neoplasm or metastatic disease given findings in the liver. 2. 12.7 x 11.6 x 12.4 cm irregular multi lobular mass lesion versus collection of clustered smaller lesions in the upper liver, largely replacing segments VII and VIII. Imaging features highly concerning for neoplasm, likely metastatic disease although primary hepatic neoplasm not excluded. Additional smaller lesions in the liver suggest metastatic involvement. 3. 8 mm subcapsular lesion lower pole left kidney is too small to characterize.  Attention on follow-up recommended. 4. Aortic Atherosclerosis (ICD10-I70.0) and Emphysema (ICD10-J43.9). Electronically Signed   By: Misty Stanley M.D.   On: 11/04/2020 14:52   CT Chest W Contrast  Result Date: 11/04/2020 CLINICAL DATA:  Cancer of unknown primary.  Right-sided pain. EXAM: CT CHEST, ABDOMEN, AND PELVIS WITH CONTRAST TECHNIQUE: Multidetector CT imaging of the chest, abdomen and pelvis was performed following the standard protocol during bolus administration of intravenous contrast. CONTRAST:  145mL OMNIPAQUE IOHEXOL 300 MG/ML  SOLN COMPARISON:  Ultrasound exam earlier same day. FINDINGS: CT CHEST FINDINGS Cardiovascular: The heart size is normal. No substantial pericardial effusion. Atherosclerotic calcification is noted in the wall of the thoracic aorta. Left permanent pacemaker noted. Mediastinum/Nodes: 14 mm short axis right paratracheal node is noted on 35/3. Precarinal lymphadenopathy measures up to 2.1 cm short axis. 13 mm short axis right  hilar node identified. No left hilar lymphadenopathy. There is no axillary lymphadenopathy.The esophagus has normal imaging features. Lungs/Pleura: Centrilobular and paraseptal emphysema evident. 5.9 x 5.1 x 5.3 cm posterior right upper lobe pulmonary mass is contiguous with the posterior aspect of the major fissure. Atelectasis noted in the lower lobes bilaterally. No substantial pleural effusion. Musculoskeletal: No worrisome lytic or sclerotic osseous abnormality. CT ABDOMEN PELVIS FINDINGS Hepatobiliary: Large 12.7 x 11.6 x 12.4 cm irregular multi lobular mass lesion versus collection of clustered smaller lesions identified in the upper liver, largely replacing segments VII and VIII. Lesion shows irregular peripheral enhancement. 2.8 cm subcapsular lesion noted lateral right liver on 51/8. There is probably a tiny 8 mm lesion in the inferior right liver on 73/8. There is no evidence for gallstones, gallbladder wall thickening, or pericholecystic fluid. No intrahepatic or extrahepatic biliary dilation. Pancreas: No focal mass lesion. No dilatation of the main duct. No intraparenchymal cyst. No peripancreatic edema. Spleen: No splenomegaly. No focal mass lesion. Adrenals/Urinary Tract: No adrenal nodule or mass. Right kidney unremarkable. 8 mm subcapsular lesion lower pole left kidney is too small to characterize. No evidence for hydroureter. The urinary bladder appears normal for the degree of distention. Stomach/Bowel: Stomach is unremarkable. No gastric wall thickening. No evidence of outlet obstruction. Duodenum is normally positioned as is the ligament of Treitz. No small bowel wall thickening. No small bowel dilatation. The terminal ileum is normal. The appendix is not visualized, but there is no edema or inflammation in the region of the cecum. No gross colonic mass. No colonic wall thickening. Vascular/Lymphatic: There is abdominal aortic atherosclerosis without aneurysm. There is no gastrohepatic or  hepatoduodenal ligament lymphadenopathy. No retroperitoneal or mesenteric lymphadenopathy. No pelvic sidewall lymphadenopathy. Reproductive: The prostate gland and seminal vesicles are unremarkable. Other: No intraperitoneal free fluid. Musculoskeletal: No worrisome lytic or sclerotic osseous abnormality. IMPRESSION: 1. 5.9 x 5.1 x 5.3 cm posterior right upper lobe pulmonary mass with mediastinal and right hilar lymphadenopathy compatible with lymph node metastases. Pulmonary lesion could represent primary bronchogenic neoplasm or metastatic disease given findings in the liver. 2. 12.7 x 11.6 x 12.4 cm irregular multi lobular mass lesion versus collection of clustered smaller lesions in the upper liver, largely replacing segments VII and VIII. Imaging features highly concerning for neoplasm, likely metastatic disease although primary hepatic neoplasm not excluded. Additional smaller lesions in the liver suggest metastatic involvement. 3. 8 mm subcapsular lesion lower pole left kidney is too small to characterize. Attention on follow-up recommended. 4. Aortic Atherosclerosis (ICD10-I70.0) and Emphysema (ICD10-J43.9). Electronically Signed   By: Misty Stanley M.D.   On: 11/04/2020 14:52   US Abdomen  Complete  Result Date: 11/04/2020 CLINICAL DATA:  Abdominal pain EXAM: ABDOMEN ULTRASOUND COMPLETE COMPARISON:  None. FINDINGS: Gallbladder: No gallstones or wall thickening visualized. No sonographic Murphy sign noted by sonographer. Common bile duct: Diameter: 4.5 mm Liver: The contour the liver appears irregular and mildly nodular. There is a large, solid-appearing mass with heterogeneous echotexture involving the right hepatic lobe which measures 13.6 x 11.6 x 12.7 cm. Portal vein is patent on color Doppler imaging with normal direction of blood flow towards the liver. IVC: No abnormality visualized. Pancreas: Visualized portion unremarkable. Spleen: Size and appearance within normal limits. Right Kidney: Length:  11.2 cm. Echogenicity within normal limits. No mass or hydronephrosis visualized. Left Kidney: Length: 10.9 cm. Echogenicity within normal limits. No mass or hydronephrosis visualized. Abdominal aorta: No aneurysm visualized. Other findings: None. IMPRESSION: 1. Large, solid-appearing heterogeneous mass involves the right lobe of liver. Findings may represent a benign or malignant neoplasm. Further evaluation with liver protocol CT without and with contrast material. 2. Normal appearance of the gallbladder. Electronically Signed   By: Kerby Moors M.D.   On: 11/04/2020 13:25   DG Hand Complete Left  Result Date: 10/26/2020 CLINICAL DATA:  Table saw injury to index, middle and ring fingers EXAM: LEFT HAND - COMPLETE 3+ VIEW COMPARISON:  None. FINDINGS: Fracture noted through the distal aspect of the right index finger middle phalanx and the proximal aspect of the right index finger distal phalanx. Fracture also noted through the tip of the distal phalanx of the left index finger. No fracture seen in the middle or ring fingers. No dislocation. IMPRESSION: Fractures in the middle and distal phalanges of the left index fingers adjacent to the DIP joint. Fracture at the tip of the distal phalanx of the left index finger. Electronically Signed   By: Rolm Baptise M.D.   On: 10/26/2020 19:29   CUP PACEART REMOTE DEVICE CHECK  Result Date: 10/26/2020 Scheduled remote reviewed. Normal device function.  30 NSVT/1 SVT, all EGM's show AF/RVR. Overall rate histograms show better rate control. [place]- Eliquis, on Coreg and Diltiazem. Next remote 91 days- [employer], RN/CV[facility]   Labs:  CBC: Recent Labs    10/13/20 1431 11/04/20 1041  WBC 11.8* 19.0*  HGB 13.5 12.4*  HCT 40.0 38.8*  PLT 427.0* 370    COAGS: Recent Labs    11/09/20 1439  INR 1.9*    BMP: Recent Labs    10/13/20 1431 11/04/20 1041  NA 135 129*  K 4.4 4.4  CL 99 94*  CO2 27 24  GLUCOSE 87 118*  BUN 8 10  CALCIUM 9.0 8.9  CREATININE  0.71 0.90  GFRNONAA  --  >60    LIVER FUNCTION TESTS: Recent Labs    10/13/20 1431 11/04/20 1041  BILITOT 0.6 1.2  AST 44* 67*  ALT 50 55*  ALKPHOS 203* 172*  PROT 7.3 7.6  ALBUMIN 3.5 2.8*    Assessment and Plan:  64 y.o. male outpatient. History of HTN, HLD, aortic atherosclerosis a fib ( on eliquis) MI s/p ICD inguinal hernia s/p mesh repair on 3.2.18. Found to have a liver and lung mass while being worked up for right sided abdominal pain. CT abdomen from 11.25.21 reads 12.7 x 11.6 x 12.4 cm irregular multi lobular mass lesion versus collection of clustered smaller lesions in the upper liver, largely replacing segments VII and VIII. Imaging features highly concerning for neoplasm, likely metastatic disease although primary hepatic neoplasm not excluded. Additional smaller lesions in the liver suggest  metastatic involvement.  Request is for a Liver biopsy for further evaluation of possible primary.   WBC is 15.9, PLT 530. All other labs are within acceptable parameters. Patient is on eliquis. Last dose on 12.7.21 No pertinent allergies. NPO since midnight.   Risks and benefits of liver biopsy was discussed with the patient and/or patient's family including, but not limited to bleeding, infection, damage to adjacent structures or low yield requiring additional tests.  All of the questions were answered and there is agreement to proceed.  Consent signed and in chart.   Thank you for this interesting consult.  I greatly enjoyed meeting Daniel Richard and look forward to participating in their care.  A copy of this report was sent to the requesting provider on this date.  Electronically Signed: Jacqualine Mau, NP 11/19/2020, 12:29 PM   I spent a total of  30 Minutes   in face to face in clinical consultation, greater than 50% of which was counseling/coordinating care for liver biopsy

## 2020-11-22 ENCOUNTER — Telehealth: Payer: Self-pay | Admitting: Internal Medicine

## 2020-11-22 NOTE — Telephone Encounter (Signed)
**  Correction**   Put in [name]'s box.   Patient's forms are due by the 11/30/2020.

## 2020-11-22 NOTE — Telephone Encounter (Signed)
Patient's daughter left FMLA forms for PCP to fill out.   Forms were put in Dr. Gwynn Burly box.    When forms are completed please call [place] at [phone].

## 2020-11-22 NOTE — Telephone Encounter (Signed)
error 

## 2020-11-23 ENCOUNTER — Telehealth: Payer: Self-pay | Admitting: Internal Medicine

## 2020-11-23 LAB — SURGICAL PATHOLOGY

## 2020-11-23 NOTE — Telephone Encounter (Signed)
Spoke with patient. Forms have been completed &Placed in providers box to review and sign.

## 2020-11-23 NOTE — Telephone Encounter (Signed)
Dr. Tresa Moore at [facility], has pathology results that he wants to discuss with Dr. Jenny Reichmann for this patient.  Please call [phone]

## 2020-11-23 NOTE — Telephone Encounter (Signed)
Sent to Dr. John. 

## 2020-11-23 NOTE — Telephone Encounter (Signed)
LVM for patient to call back. Please transfer call to [place].

## 2020-11-24 DIAGNOSIS — Z0279 Encounter for issue of other medical certificate: Secondary | ICD-10-CM

## 2020-11-24 NOTE — Telephone Encounter (Signed)
   Patient calling with questions about biopsy results

## 2020-11-24 NOTE — Progress Notes (Signed)
Spoke with patient regarding referral we received from Dr. Jenny Reichmann, I have offered him an appointment on Monday 12/20 at 2:45 to arrive by 2:30 to be seen by Ned Card NP and Dr. Julieanne Manson.  He is aware of our location and knows he may bring one person with him to this consult.  I have given him my direct number should he have questions prior to his consult.

## 2020-11-24 NOTE — Telephone Encounter (Signed)
Sent to Dr. John. 

## 2020-11-24 NOTE — Telephone Encounter (Signed)
Tried to phone dr Tresa Moore - already left for the day  Spoke to pt - available path report reviewed with pt, possibly c/w poorly differentiated hepatocellular carcinoma  Pt aware, questoins answered, ok to continue to pursue the oncology referral

## 2020-11-24 NOTE — Telephone Encounter (Signed)
Forms have been signed, Faxed, Copy sent to scan &Charged for.   LVM to inform patient &Original mailed to patient for his records.

## 2020-11-29 ENCOUNTER — Ambulatory Visit (HOSPITAL_COMMUNITY)
Admission: RE | Admit: 2020-11-29 | Discharge: 2020-11-29 | Disposition: A | Discharge status: Home / Self Care | Payer: BC Managed Care – PPO | Source: Ambulatory Visit | Attending: Nurse Practitioner | Admitting: Nurse Practitioner

## 2020-11-29 ENCOUNTER — Other Ambulatory Visit: Payer: Self-pay

## 2020-11-29 ENCOUNTER — Inpatient Hospital Stay: Payer: BC Managed Care – PPO | Attending: Nurse Practitioner | Admitting: Nurse Practitioner

## 2020-11-29 ENCOUNTER — Encounter: Payer: Self-pay | Admitting: Nurse Practitioner

## 2020-11-29 VITALS — BP 130/81 | HR 100 | Temp 99.5°F | Resp 18 | Ht 72.0 in | Wt 169.2 lb

## 2020-11-29 DIAGNOSIS — J9811 Atelectasis: Secondary | ICD-10-CM | POA: Diagnosis not present

## 2020-11-29 DIAGNOSIS — I4891 Unspecified atrial fibrillation: Secondary | ICD-10-CM

## 2020-11-29 DIAGNOSIS — Z87891 Personal history of nicotine dependence: Secondary | ICD-10-CM

## 2020-11-29 DIAGNOSIS — R109 Unspecified abdominal pain: Secondary | ICD-10-CM | POA: Diagnosis not present

## 2020-11-29 DIAGNOSIS — R918 Other nonspecific abnormal finding of lung field: Secondary | ICD-10-CM | POA: Diagnosis not present

## 2020-11-29 DIAGNOSIS — J439 Emphysema, unspecified: Secondary | ICD-10-CM | POA: Diagnosis not present

## 2020-11-29 DIAGNOSIS — R0600 Dyspnea, unspecified: Secondary | ICD-10-CM

## 2020-11-29 DIAGNOSIS — J449 Chronic obstructive pulmonary disease, unspecified: Secondary | ICD-10-CM

## 2020-11-29 DIAGNOSIS — C220 Liver cell carcinoma: Secondary | ICD-10-CM

## 2020-11-29 DIAGNOSIS — R21 Rash and other nonspecific skin eruption: Secondary | ICD-10-CM | POA: Diagnosis not present

## 2020-11-29 DIAGNOSIS — R630 Anorexia: Secondary | ICD-10-CM

## 2020-11-29 DIAGNOSIS — I2510 Atherosclerotic heart disease of native coronary artery without angina pectoris: Secondary | ICD-10-CM

## 2020-11-29 DIAGNOSIS — Z79899 Other long term (current) drug therapy: Secondary | ICD-10-CM

## 2020-11-29 MED ORDER — PREDNISONE 10 MG PO TABS: ORAL_TABLET | ORAL | 0 refills | Status: DC

## 2020-11-29 MED ORDER — ALBUTEROL SULFATE HFA 108 (90 BASE) MCG/ACT IN AERS: 2.0000 | INHALATION_SPRAY | Freq: Four times a day (QID) | RESPIRATORY_TRACT | 2 refills | Status: AC | PRN

## 2020-11-29 NOTE — Progress Notes (Addendum)
New Hematology/Oncology Consult   Requesting MD: Dr. Cathlean Cower  [phone]  Reason for Consult: Poorly differentiated carcinoma involving a liver lesion  HPI: [name] is a 64 year old man who presented to the emergency department 11/04/2020 with right sided abdominal pain.  Abdominal ultrasound showed the contour of the liver to be irregular and mildly nodular; there was a large, solid appearing mass with heterogeneous echotexture involving the right hepatic lobe measuring 13.6 x 11.6 x 12.7 cm.  CTs chest/abdomen/pelvis also 11/04/2020 showed a large 12.7 x 11.6 x 12.4 cm irregular multilobular mass lesion versus collection of clustered smaller lesions in the upper liver; 2.8 cm subcapsular lesion noted lateral right liver; probable tiny 8 mm lesion in the inferior right liver; no splenomegaly; 5.9 x 5.1 x 5.3 cm posterior right upper lobe pulmonary mass contiguous with the posterior aspect of the major fissure; 14 mm right paratracheal node; precarinal lymphadenopathy measuring up to 2.1 cm; 13 mm right hilar node; centrilobular and paraseptal emphysema.  Biopsy of right liver mass on 11/19/2020 showed poorly differentiated carcinoma with immunoprofile most consistent with a poorly differentiated hepatocellular carcinoma.     Past Medical History:  Diagnosis Date  . AICD (automatic cardioverter/defibrillator) present    ST JUDE  . ALLERGIC RHINITIS   . ANXIETY   . Arthritis    IN FINGERS  . Cardiac arrest ([place]) 11/21/2015  . Cardiac device in situ   . Coronary artery disease   . HYPERLIPIDEMIA   . HYPERTENSION   . LOW BACK PAIN   . Myocardial infarction ([place]) 11/21/2015  . Paroxysmal atrial fibrillation ([facility])   :   Past Surgical History:  Procedure Laterality Date  . CARDIAC CATHETERIZATION N/A 11/21/2015   Procedure: Left Heart Cath and Coronary Angiography;  Surgeon: Wellington Hampshire, MD;  Location: [facility];  Service: Cardiovascular;  Laterality: N/A;  .  EP IMPLANTABLE DEVICE N/A 11/29/2015   Procedure: ICD Implant;  Surgeon: Deboraha Sprang, MD;  Location: [facility];  Service: Cardiovascular;  Laterality: N/A;  . INGUINAL HERNIA REPAIR Left 02/09/2017   Procedure: OPEN REPAIR LEFT INGUINAL HERNIA ;  Surgeon: Greer Pickerel, MD;  Location: [facility];  Service: General;  Laterality: Left;  . INSERTION OF MESH Left 02/09/2017   Procedure: INSERTION OF MESH;  Surgeon: Greer Pickerel, MD;  Location: [facility];  Service: General;  Laterality: Left;  :   Current Outpatient Medications:  .  carvedilol (COREG) 25 MG tablet, TAKE 0.5 TABLETS (12.5 MG TOTAL) BY MOUTH 2 (TWO) TIMES DAILY WITH A MEAL., Disp: 90 tablet, Rfl: 1 .  cholecalciferol (VITAMIN D3) 25 MCG (1000 UNIT) tablet, Take 3,000 Units by mouth daily., Disp: , Rfl:  .  diltiazem (CARDIZEM CD) 180 MG 24 hr capsule, Take 1 capsule (180 mg total) by mouth daily., Disp: 90 capsule, Rfl: 3 .  ELIQUIS 5 MG TABS tablet, TAKE 1 TABLET BY MOUTH TWICE A DAY (Patient taking differently: Take 5 mg by mouth 2 (two) times daily.), Disp: 180 tablet, Rfl: 2 .  HYDROcodone-acetaminophen (NORCO/VICODIN) 5-325 MG tablet, Take 1 tablet by mouth every 4 (four) hours as needed for moderate pain., Disp: 40 tablet, Rfl: 0 .  ibuprofen (ADVIL,MOTRIN) 200 MG tablet, Take 200 mg by mouth every 6 (six) hours as needed (Pain)., Disp: , Rfl:  .  Multiple Vitamins-Minerals (MULTIVITAMIN WITH MINERALS) tablet, Take 1 tablet by mouth daily., Disp: , Rfl:  .  rosuvastatin (CRESTOR) 40 MG tablet, TAKE 1 TABLET BY MOUTH  EVERY DAY (Patient taking differently: Take 40 mg by mouth daily.), Disp: 90 tablet, Rfl: 1 .  clindamycin (CLEOCIN) 300 MG capsule, Take 1 capsule (300 mg total) by mouth 4 (four) times daily. (Patient not taking: Reported on 11/16/2020), Disp: 28 capsule, Rfl: 0:  :   Allergies  Allergen Reactions  . Lipitor [Atorvastatin]     myalgia  . Meperidine Hcl Other (See Comments)    "TURNED WHITE AS A SHEEP, SWEATY  COULD NOT STAND" DEMEROL  :  FH: Mother deceased with pneumonia, history of breast cancer; father deceased with lung cancer.  SOCIAL HISTORY: He lives in [place].  Originally from [place].  He is married.  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  He started smoking at the age of 69, 1 pack/day.  Quit smoking 15 years ago.  He quit drinking 2 months ago, mainly beer.  He reports heavy alcohol consumption for many years.  He has never had a blood transfusion.  Review of Systems: He has had a progressively worsening cough since October.  More recent progressive dyspnea.  Decreased appetite with weight loss of 20 pounds over the past few months.  No fevers or sweats.  Right-sided abdominal pain intermittently over the past month.  Hydrocodone helps.  He denies bleeding.  No unusual headaches.  No vision change.  No nausea or vomiting.  No diarrhea.  Some constipation.  No urinary symptoms.  Physical Exam:  Blood pressure 130/81, pulse 100, temperature 99.5 F (37.5 C), temperature source Tympanic, resp. rate 18, height 6' (1.829 m), weight 169 lb 3.2 oz (76.7 kg), SpO2 100 %.  Oxygen level on room air at rest 96%, oxygen level on room air with exertion 72%, oxygen level on 2 L of oxygen at rest 100%, oxygen level on 2 L of oxygen with exertion 96%  HEENT: Neck without mass. Lungs: Breath sounds diminished right lower lung field.  He becomes dyspneic while talking. Cardiac: Regular rate and rhythm. Abdomen: Generalized abdominal tenderness. Vascular: No leg edema. Lymph nodes: No palpable cervical, supraclavicular or axillary lymph nodes. Neurologic: Alert and oriented. Skin: Macular rash scattered over the trunk.  LABS: none  RADIOLOGY:  CT ABDOMEN PELVIS W WO CONTRAST  Result Date: 11/04/2020 CLINICAL DATA:  Cancer of unknown primary.  Right-sided pain. EXAM: CT CHEST, ABDOMEN, AND PELVIS WITH CONTRAST TECHNIQUE: Multidetector CT imaging of  the chest, abdomen and pelvis was performed following the standard protocol during bolus administration of intravenous contrast. CONTRAST:  171mL OMNIPAQUE IOHEXOL 300 MG/ML  SOLN COMPARISON:  Ultrasound exam earlier same day. FINDINGS: CT CHEST FINDINGS Cardiovascular: The heart size is normal. No substantial pericardial effusion. Atherosclerotic calcification is noted in the wall of the thoracic aorta. Left permanent pacemaker noted. Mediastinum/Nodes: 14 mm short axis right paratracheal node is noted on 35/3. Precarinal lymphadenopathy measures up to 2.1 cm short axis. 13 mm short axis right hilar node identified. No left hilar lymphadenopathy. There is no axillary lymphadenopathy.The esophagus has normal imaging features. Lungs/Pleura: Centrilobular and paraseptal emphysema evident. 5.9 x 5.1 x 5.3 cm posterior right upper lobe pulmonary mass is contiguous with the posterior aspect of the major fissure. Atelectasis noted in the lower lobes bilaterally. No substantial pleural effusion. Musculoskeletal: No worrisome lytic or sclerotic osseous abnormality. CT ABDOMEN PELVIS FINDINGS Hepatobiliary: Large 12.7 x 11.6 x 12.4 cm irregular multi lobular mass lesion versus collection of clustered smaller lesions identified in the upper liver, largely replacing segments VII and VIII.  Lesion shows irregular peripheral enhancement. 2.8 cm subcapsular lesion noted lateral right liver on 51/8. There is probably a tiny 8 mm lesion in the inferior right liver on 73/8. There is no evidence for gallstones, gallbladder wall thickening, or pericholecystic fluid. No intrahepatic or extrahepatic biliary dilation. Pancreas: No focal mass lesion. No dilatation of the main duct. No intraparenchymal cyst. No peripancreatic edema. Spleen: No splenomegaly. No focal mass lesion. Adrenals/Urinary Tract: No adrenal nodule or mass. Right kidney unremarkable. 8 mm subcapsular lesion lower pole left kidney is too small to characterize. No  evidence for hydroureter. The urinary bladder appears normal for the degree of distention. Stomach/Bowel: Stomach is unremarkable. No gastric wall thickening. No evidence of outlet obstruction. Duodenum is normally positioned as is the ligament of Treitz. No small bowel wall thickening. No small bowel dilatation. The terminal ileum is normal. The appendix is not visualized, but there is no edema or inflammation in the region of the cecum. No gross colonic mass. No colonic wall thickening. Vascular/Lymphatic: There is abdominal aortic atherosclerosis without aneurysm. There is no gastrohepatic or hepatoduodenal ligament lymphadenopathy. No retroperitoneal or mesenteric lymphadenopathy. No pelvic sidewall lymphadenopathy. Reproductive: The prostate gland and seminal vesicles are unremarkable. Other: No intraperitoneal free fluid. Musculoskeletal: No worrisome lytic or sclerotic osseous abnormality. IMPRESSION: 1. 5.9 x 5.1 x 5.3 cm posterior right upper lobe pulmonary mass with mediastinal and right hilar lymphadenopathy compatible with lymph node metastases. Pulmonary lesion could represent primary bronchogenic neoplasm or metastatic disease given findings in the liver. 2. 12.7 x 11.6 x 12.4 cm irregular multi lobular mass lesion versus collection of clustered smaller lesions in the upper liver, largely replacing segments VII and VIII. Imaging features highly concerning for neoplasm, likely metastatic disease although primary hepatic neoplasm not excluded. Additional smaller lesions in the liver suggest metastatic involvement. 3. 8 mm subcapsular lesion lower pole left kidney is too small to characterize. Attention on follow-up recommended. 4. Aortic Atherosclerosis (ICD10-I70.0) and Emphysema (ICD10-J43.9). Electronically Signed   By: Misty Stanley M.D.   On: 11/04/2020 14:52   CT Chest W Contrast  Result Date: 11/04/2020 CLINICAL DATA:  Cancer of unknown primary.  Right-sided pain. EXAM: CT CHEST, ABDOMEN, AND  PELVIS WITH CONTRAST TECHNIQUE: Multidetector CT imaging of the chest, abdomen and pelvis was performed following the standard protocol during bolus administration of intravenous contrast. CONTRAST:  173mL OMNIPAQUE IOHEXOL 300 MG/ML  SOLN COMPARISON:  Ultrasound exam earlier same day. FINDINGS: CT CHEST FINDINGS Cardiovascular: The heart size is normal. No substantial pericardial effusion. Atherosclerotic calcification is noted in the wall of the thoracic aorta. Left permanent pacemaker noted. Mediastinum/Nodes: 14 mm short axis right paratracheal node is noted on 35/3. Precarinal lymphadenopathy measures up to 2.1 cm short axis. 13 mm short axis right hilar node identified. No left hilar lymphadenopathy. There is no axillary lymphadenopathy.The esophagus has normal imaging features. Lungs/Pleura: Centrilobular and paraseptal emphysema evident. 5.9 x 5.1 x 5.3 cm posterior right upper lobe pulmonary mass is contiguous with the posterior aspect of the major fissure. Atelectasis noted in the lower lobes bilaterally. No substantial pleural effusion. Musculoskeletal: No worrisome lytic or sclerotic osseous abnormality. CT ABDOMEN PELVIS FINDINGS Hepatobiliary: Large 12.7 x 11.6 x 12.4 cm irregular multi lobular mass lesion versus collection of clustered smaller lesions identified in the upper liver, largely replacing segments VII and VIII. Lesion shows irregular peripheral enhancement. 2.8 cm subcapsular lesion noted lateral right liver on 51/8. There is probably a tiny 8 mm lesion in the inferior right liver on  73/8. There is no evidence for gallstones, gallbladder wall thickening, or pericholecystic fluid. No intrahepatic or extrahepatic biliary dilation. Pancreas: No focal mass lesion. No dilatation of the main duct. No intraparenchymal cyst. No peripancreatic edema. Spleen: No splenomegaly. No focal mass lesion. Adrenals/Urinary Tract: No adrenal nodule or mass. Right kidney unremarkable. 8 mm subcapsular lesion  lower pole left kidney is too small to characterize. No evidence for hydroureter. The urinary bladder appears normal for the degree of distention. Stomach/Bowel: Stomach is unremarkable. No gastric wall thickening. No evidence of outlet obstruction. Duodenum is normally positioned as is the ligament of Treitz. No small bowel wall thickening. No small bowel dilatation. The terminal ileum is normal. The appendix is not visualized, but there is no edema or inflammation in the region of the cecum. No gross colonic mass. No colonic wall thickening. Vascular/Lymphatic: There is abdominal aortic atherosclerosis without aneurysm. There is no gastrohepatic or hepatoduodenal ligament lymphadenopathy. No retroperitoneal or mesenteric lymphadenopathy. No pelvic sidewall lymphadenopathy. Reproductive: The prostate gland and seminal vesicles are unremarkable. Other: No intraperitoneal free fluid. Musculoskeletal: No worrisome lytic or sclerotic osseous abnormality. IMPRESSION: 1. 5.9 x 5.1 x 5.3 cm posterior right upper lobe pulmonary mass with mediastinal and right hilar lymphadenopathy compatible with lymph node metastases. Pulmonary lesion could represent primary bronchogenic neoplasm or metastatic disease given findings in the liver. 2. 12.7 x 11.6 x 12.4 cm irregular multi lobular mass lesion versus collection of clustered smaller lesions in the upper liver, largely replacing segments VII and VIII. Imaging features highly concerning for neoplasm, likely metastatic disease although primary hepatic neoplasm not excluded. Additional smaller lesions in the liver suggest metastatic involvement. 3. 8 mm subcapsular lesion lower pole left kidney is too small to characterize. Attention on follow-up recommended. 4. Aortic Atherosclerosis (ICD10-I70.0) and Emphysema (ICD10-J43.9). Electronically Signed   By: Misty Stanley M.D.   On: 11/04/2020 14:52   US Abdomen Complete  Result Date: 11/04/2020 CLINICAL DATA:  Abdominal pain  EXAM: ABDOMEN ULTRASOUND COMPLETE COMPARISON:  None. FINDINGS: Gallbladder: No gallstones or wall thickening visualized. No sonographic Murphy sign noted by sonographer. Common bile duct: Diameter: 4.5 mm Liver: The contour the liver appears irregular and mildly nodular. There is a large, solid-appearing mass with heterogeneous echotexture involving the right hepatic lobe which measures 13.6 x 11.6 x 12.7 cm. Portal vein is patent on color Doppler imaging with normal direction of blood flow towards the liver. IVC: No abnormality visualized. Pancreas: Visualized portion unremarkable. Spleen: Size and appearance within normal limits. Right Kidney: Length: 11.2 cm. Echogenicity within normal limits. No mass or hydronephrosis visualized. Left Kidney: Length: 10.9 cm. Echogenicity within normal limits. No mass or hydronephrosis visualized. Abdominal aorta: No aneurysm visualized. Other findings: None. IMPRESSION: 1. Large, solid-appearing heterogeneous mass involves the right lobe of liver. Findings may represent a benign or malignant neoplasm. Further evaluation with liver protocol CT without and with contrast material. 2. Normal appearance of the gallbladder. Electronically Signed   By: Kerby Moors M.D.   On: 11/04/2020 13:25   US BIOPSY (LIVER)  Result Date: 11/19/2020 INDICATION: Large right hepatic mass concerning for metastatic disease versus primary hepatic malignancy EXAM: ULTRASOUND RIGHT HEPATIC MASS 18 GAUGE CORE BIOPSY MEDICATIONS: 1% LIDOCAINE LOCAL ANESTHESIA/SEDATION: Moderate (conscious) sedation was employed during this procedure. A total of Versed 1.0 mg and Fentanyl 50 mcg was administered intravenously. Moderate Sedation Time: 10 minutes. The patient's level of consciousness and vital signs were monitored continuously by radiology nursing throughout the procedure under my direct supervision. FLUOROSCOPY  TIME:  Fluoroscopy Time: NONE. COMPLICATIONS: None immediate. PROCEDURE: Informed written  consent was obtained from the patient after a thorough discussion of the procedural risks, benefits and alternatives. All questions were addressed. Maximal Sterile Barrier Technique was utilized including caps, mask, sterile gowns, sterile gloves, sterile drape, hand hygiene and skin antiseptic. A timeout was performed prior to the initiation of the procedure. Previous imaging reviewed. Preliminary ultrasound performed. The heterogeneous right hepatic mass was localized in a mid axillary line lower intercostal space. Overlying skin marked. Under sterile conditions and local anesthesia, a 17 gauge guide needle was advanced into the lesion anteriorly. Needle position confirmed with ultrasound. 18 gauge core biopsies obtained of the lesion peripherally. Samples were intact and non fragmented. These were placed in formalin. Needle tract occluded with Gel-Foam. Postprocedure imaging demonstrates no hemorrhage or hematoma. Patient tolerated biopsy well. IMPRESSION: Successful ultrasound right liver mass 18 gauge core biopsies Electronically Signed   By: Jerilynn Mages.  Shick M.D.   On: 11/19/2020 14:47    Assessment and Plan:   1. Poorly differentiated carcinoma status post biopsy of a liver lesion   Abdominal ultrasound 11/04/2020-contour of the liver irregular and mildly nodular;large, solid appearing mass with heterogeneous echotexture involving the right hepatic lobe measuring 13.6 x 11.6 x 12.7 cm.    CTs chest/abdomen/pelvis 11/04/2020-large 12.7 x 11.6 x 12.4 cm irregular multilobular mass lesion versus collection of clustered smaller lesions in the upper liver; 2.8 cm subcapsular lesion noted lateral right liver; probable tiny 8 mm lesion in the inferior right liver; no splenomegaly; 5.9 x 5.1 x 5.3 cm posterior right upper lobe pulmonary mass contiguous with the posterior aspect of the major fissure; 14 mm right paratracheal node; precarinal lymphadenopathy measuring up to 2.1 cm; 13 mm right hilar node;  centrilobular and paraseptal emphysema.    Biopsy of right liver mass 11/19/2020-poorly differentiated carcinoma with immunoprofile most consistent with a poorly differentiated hepatocellular carcinoma 2. Atrial fibrillation 3. CAD/MI 4. COPD  5. History of tobacco and alcohol use 6. Anorexia/weight loss  [name] is a 64 year old man who initially presented about a month ago with right-sided abdominal pain.  He was found to have a lung mass and several liver lesions.  Biopsy of a liver lesion was most consistent with hepatocellular carcinoma.  He has risk factors for both lung cancer and primary liver cancer.  Dr. Gearldine Shown recommendation is to biopsy the lung mass.  [name] agrees with this plan.  We made a referral to interventional radiology.  He is symptomatic with dyspnea and abdominal pain.  The dyspnea is likely multifactorial secondary to the lung mass, dominant liver mass irritating the diaphragm and emphysema.  We are referring him for a chest x-ray to evaluate for a pleural effusion.  He will complete a course of prednisone.  We also prescribed an albuterol inhaler.  He becomes hypoxic with exertion.  We are trying to make arrangements for home oxygen therapy.  He will return for follow-up on 12/09/2020.  We will obtain labs that day to include CBC, chemistry panel, AFP.  Patient seen with Dr. Benay Spice.    Ned Card, NP 11/29/2020, 3:57 PM   Addendum-we were unable to obtain home oxygen for him tonight.  We recommended he go to the emergency department.  He declines this option.  If his breathing worsens he will seek emergency evaluation.  Plan otherwise as outlined above.  This was a shared visit with Ned Card.  [name] was interviewed and examined.  We reviewed  the CT images with him.  He has risk factors for both hepatocellular carcinoma and lung cancer.  It is unclear whether the liver and lung masses represent the same primary tumor, synchronous  primaries, or metastases.  He is symptomatic with dyspnea and pain.  The pain is likely related to the dominant liver mass.  The dyspnea is multifactorial.  He will begin an inhaler and steroid taper.  We will arrange for a biopsy of the lung mass prior to deciding on a systemic treatment plan.  We are making arrangements for home oxygen therapy.  He knows to go to the emergency room for increased dyspnea.  Julieanne Manson, MD

## 2020-11-30 ENCOUNTER — Telehealth: Payer: Self-pay | Admitting: *Deleted

## 2020-11-30 ENCOUNTER — Encounter (HOSPITAL_COMMUNITY): Payer: Self-pay | Admitting: Radiology

## 2020-11-30 NOTE — Progress Notes (Signed)
Met with patient and his wife Izora Gala at initial medical oncology consult with Ned Card NP and Dr. Julieanne Manson.  I explained my role as nurse navigator and they were given my card with my direct contact information.  They understand the plan is to obtain a lung biopsy for further diagnosis which will help define the appropriate treatment regimen.

## 2020-11-30 NOTE — Telephone Encounter (Signed)
Faxed oxygen orders w/[employer], insurance information and office note to [place] [phone] at 1750 yesterday and again this morning. Called to confirm receipt--has been received and now in insurance review.

## 2020-11-30 NOTE — Progress Notes (Signed)
Daniel Richard Male, 64 y.o., 07/08/1956  MRN:  [id number] Phone:  [phone] (M) ...       PCP:  Biagio Borg, MD Coverage:  Sherre Poot [employer] Ppo  Next Appt With Cardiology 01/25/2021 at 8:25 AM           RE: CT Biopsy Received: Today Suttle, Rosanne Ashing, MD  Thayer Dallas, MD  Dr. Benay Spice,   [name]'s lung bass is feasible, however very high risk for pneumothorax given his degree of paraseptal emphysema in the region of the mass. I just wanted to bring to the table the possibility of bronchoscopic biopsy of his large mediastinal lymph nodes as in my perception this is much lower risk. If this is unavailable, we could proceed with lung mass biopsy.   Thank you,   Dylan        Previous Messages   ----- Message -----  From: [name]  Sent: 11/30/2020  9:40 AM EST  To: [facility] Procedure Requests  Subject: CT Biopsy                     Procedure:  CT Biopsy   Reason: Mass of upper lobe of right lung, Carcinoma of liver, biopsy right lung mass   History: [place], CT, Liver Biopsy in computer        Per Review from Liver Biopsy:   Suttle, Rosanne Ashing, MD [name]  Approved for ultrasound guided right lobe mass biopsy. Most logical starting point given severe paraseptal emphysema and risk of complication with lung biopsy. Several right hilar nodes which could be sampled via EBUS at lower risk. If liver results as lung metastasis, no need for lung biopsy. If liver is primary or of other etiology, recommend evaluation for EBUS, and if that is unsuccessful then high risk lung biopsy with CT guidance can be considered.   Dylan    Provider: Ned Card K   Provider Contact:  [phone]

## 2020-12-01 ENCOUNTER — Telehealth: Payer: Self-pay | Admitting: *Deleted

## 2020-12-01 ENCOUNTER — Other Ambulatory Visit: Payer: Self-pay | Admitting: Nurse Practitioner

## 2020-12-01 DIAGNOSIS — R918 Other nonspecific abnormal finding of lung field: Secondary | ICD-10-CM

## 2020-12-01 DIAGNOSIS — C220 Liver cell carcinoma: Secondary | ICD-10-CM

## 2020-12-01 MED ORDER — HYDROCODONE-ACETAMINOPHEN 5-325 MG PO TABS: 1.0000 | ORAL_TABLET | ORAL | 0 refills | Status: DC | PRN

## 2020-12-01 NOTE — Telephone Encounter (Addendum)
Daniel Richard and was informed he is scheduled for his oxygen delivery today. Called patient and made him aware. Also informed him per Ned Card, NP that radiology is not able to safely biopsy the lung lesion. He is being referred to pulmonary to review and may require bronchoscopy to obtain tissue diagnosis. He expressed he did not want this done. Encouraged him to see pulmonary and discuss before making a decision. Dr. Benay Spice has sent message to Dr. Valeta Harms.

## 2020-12-06 ENCOUNTER — Telehealth: Payer: Self-pay | Admitting: Pulmonary Disease

## 2020-12-06 NOTE — Telephone Encounter (Signed)
Staff messages sent by Dr. Benay Spice and Dr. Valeta Harms: Valeta Harms, Octavio Graves, DO  Shakaya Bhullar, Waldemar Dickens, CMA  Raquel Sarna, can we get him in today?  Anywhere would do.  If not on Wednesday  Ok to double book a morning or afternoon slot   Brad        Previous Messages   ----- Message -----  From: Ladell Pier, MD  Sent: 12/01/2020  3:27 PM EST  To: Garner Nash, DO  Subject: RE:                        Thanks, he is pretty sick , hope to get him on treatment soon  ----- Message -----  From: Garner Nash, DO  Sent: 12/01/2020  8:48 AM EST  To: Ladell Pier, MD  Subject: RE:                        Yes we can get him worked in for a biopsy. I am out of town this week. I'll be back Monday. Hopefully we can get him done ASAP.  Thanks,  Brad  ----- Message -----  From: Ladell Pier, MD  Sent: 12/01/2020  8:30 AM EST  To: Garner Nash, DO   This is a patient with a large liver mass and right lung mass/adenopathy. Biopsy of the liver mass reveals a poorly differentiated carcinoma, immunostains consistent with hepatocellular carcinoma   I referred him for a CT-guided biopsy of the pleural-based lung mass and IR felt he was at high risk for a pneumo   Can you guys get him in for a diagnostic bronchoscopy? It is not clear whether he has 2 primary tumors or metastatic disease   He appears to have significant COPD, we put him on a steroid taper, inhaler, and home oxygen when we saw him earlier this week  Thanks,   [employer] pt and spoke with daughter Vikki Ports who stated pt was not avail to take my call. Stated to [facility] that Dr. Valeta Harms was wanting to get pt scheduled for an appt per oncology. She stated that she would let pt know and have pt call us when able to get appt scheduled. Stated to her that Dr. Valeta Harms was hoping to get pt in for appt today and she still stated she would have pt call us when able as he has a schedule  lined out with upcoming appts with Oncology.

## 2020-12-06 NOTE — Telephone Encounter (Signed)
Checked pt's chart and pt has been scheduled for a consult appt with Dr. Valeta Harms 12/08/20 at 1:30. Routing to Dr. Valeta Harms as an Juluis Rainier.

## 2020-12-08 ENCOUNTER — Ambulatory Visit: Payer: BC Managed Care – PPO | Admitting: Pulmonary Disease

## 2020-12-08 ENCOUNTER — Other Ambulatory Visit: Payer: Self-pay

## 2020-12-08 ENCOUNTER — Telehealth: Payer: Self-pay | Admitting: Pulmonary Disease

## 2020-12-08 ENCOUNTER — Encounter: Payer: Self-pay | Admitting: Pulmonary Disease

## 2020-12-08 VITALS — BP 122/70 | HR 88 | Temp 97.9°F | Ht 72.0 in | Wt 170.1 lb

## 2020-12-08 DIAGNOSIS — Z7901 Long term (current) use of anticoagulants: Secondary | ICD-10-CM | POA: Diagnosis not present

## 2020-12-08 DIAGNOSIS — R160 Hepatomegaly, not elsewhere classified: Secondary | ICD-10-CM

## 2020-12-08 DIAGNOSIS — R918 Other nonspecific abnormal finding of lung field: Secondary | ICD-10-CM | POA: Diagnosis not present

## 2020-12-08 DIAGNOSIS — R590 Localized enlarged lymph nodes: Secondary | ICD-10-CM

## 2020-12-08 DIAGNOSIS — C220 Liver cell carcinoma: Secondary | ICD-10-CM

## 2020-12-08 MED ORDER — TRELEGY ELLIPTA 100-62.5-25 MCG/INH IN AEPB: 1.0000 | INHALATION_SPRAY | Freq: Every day | RESPIRATORY_TRACT | 0 refills | Status: AC

## 2020-12-08 MED ORDER — TRELEGY ELLIPTA 100-62.5-25 MCG/INH IN AEPB: 1.0000 | INHALATION_SPRAY | Freq: Every day | RESPIRATORY_TRACT | 2 refills | Status: AC

## 2020-12-08 NOTE — Progress Notes (Signed)
Synopsis: Referred in December 2021 for lung mass by Biagio Borg, MD  Subjective:   PATIENT ID: Daniel Richard GENDER: male DOB: 02/09/1956, MRN: [id number]  Chief Complaint  Patient presents with  . Consult    Eval spot on lung  per Dr. Benay Spice    This is a 64 year old gentleman, former smoker quit in 2009.  Past medical history of hyperlipidemia hypertension, CT imaging with emphysema present.  No prior pulmonary function tests.  Also has AICD, previous MI, PAF.  He is currently prescribed Eliquis.  Imaging was reviewed by interventional radiology recommending bronchoscopic evaluation tissue biopsy for lung mass.  Patient initial CT imaging that was completed in November 2021 revealed a large hepatic mass.  The hepatic mass was consistent with a poorly differentiated carcinoma of the liver.  Therefore due to the concern of whether or not we were dealing with a metastatic lesion from the lung versus primary liver immune O staining was completed on the tissue which was most consistent with a poorly differentiated hepatocellular carcinoma.  Therefore the decision was made for pursuing biopsy of the lung.  OV 12/08/2020: Patient feels much better from a respiratory standpoint.  Super anxious about everything that has transpired over the past couple of weeks.  He has lost about 20 pounds.  Denies hemoptysis.  He was having an exacerbation of his likely underlying diagnosis of COPD which is better now after completion of course of steroids.  He is not on any maintenance and inhalers.  Currently using as needed albuterol.   Past Medical History:  Diagnosis Date  . AICD (automatic cardioverter/defibrillator) present    ST JUDE  . ALLERGIC RHINITIS   . ANXIETY   . Arthritis    IN FINGERS  . Cardiac arrest ([place]) 11/21/2015  . Cardiac device in situ   . Coronary artery disease   . HYPERLIPIDEMIA   . HYPERTENSION   . LOW BACK PAIN   . Myocardial infarction ([place]) 11/21/2015  . Paroxysmal  atrial fibrillation ([facility])      Family History  Problem Relation Age of Onset  . Hypertension Other      Past Surgical History:  Procedure Laterality Date  . CARDIAC CATHETERIZATION N/A 11/21/2015   Procedure: Left Heart Cath and Coronary Angiography;  Surgeon: Wellington Hampshire, MD;  Location: [place] [facility];  Service: Cardiovascular;  Laterality: N/A;  . EP IMPLANTABLE DEVICE N/A 11/29/2015   Procedure: ICD Implant;  Surgeon: Deboraha Sprang, MD;  Location: [place] [facility];  Service: Cardiovascular;  Laterality: N/A;  . INGUINAL HERNIA REPAIR Left 02/09/2017   Procedure: OPEN REPAIR LEFT INGUINAL HERNIA ;  Surgeon: Greer Pickerel, MD;  Location: [facility];  Service: General;  Laterality: Left;  . INSERTION OF MESH Left 02/09/2017   Procedure: INSERTION OF MESH;  Surgeon: Greer Pickerel, MD;  Location: [facility];  Service: General;  Laterality: Left;    Social History   Socioeconomic History  . Marital status: Married    Spouse name: Not on file  . Number of children: Not on file  . Years of education: Not on file  . Highest education level: Not on file  Occupational History  . Occupation: Maintenance/painting     Employer: [employer]  Tobacco Use  . Smoking status: Former Smoker    Packs/day: 1.00    Years: 20.00    Pack years: 20.00    Types: Cigarettes    Quit date: 12/12/2007    Years since quitting:  13.0  . Smokeless tobacco: Never Used  Substance and Sexual Activity  . Alcohol use: Yes    Comment: occ  . Drug use: No  . Sexual activity: Not on file  Other Topics Concern  . Not on file  Social History Narrative   Lives with wife and teenage daughter   Social Determinants of Health   Financial Resource Strain: Not on file  Food Insecurity: Not on file  Transportation Needs: Not on file  Physical Activity: Not on file  Stress: Not on file  Social Connections: Not on file  Intimate Partner Violence: Not on file     Allergies  Allergen Reactions  . Lipitor  [Atorvastatin]     myalgia  . Meperidine Hcl Other (See Comments)    "TURNED WHITE AS A SHEEP, SWEATY COULD NOT STAND" DEMEROL     Outpatient Medications Prior to Visit  Medication Sig Dispense Refill  . albuterol (VENTOLIN HFA) 108 (90 Base) MCG/ACT inhaler Inhale 2 puffs into the lungs every 6 (six) hours as needed for wheezing or shortness of breath. 8 g 2  . carvedilol (COREG) 25 MG tablet TAKE 0.5 TABLETS (12.5 MG TOTAL) BY MOUTH 2 (TWO) TIMES DAILY WITH A MEAL. 90 tablet 1  . cholecalciferol (VITAMIN D3) 25 MCG (1000 UNIT) tablet Take 3,000 Units by mouth daily.    Marland Kitchen diltiazem (CARDIZEM CD) 180 MG 24 hr capsule Take 1 capsule (180 mg total) by mouth daily. 90 capsule 3  . ELIQUIS 5 MG TABS tablet TAKE 1 TABLET BY MOUTH TWICE A DAY (Patient taking differently: Take 5 mg by mouth 2 (two) times daily.) 180 tablet 2  . HYDROcodone-acetaminophen (NORCO/VICODIN) 5-325 MG tablet Take 1 tablet by mouth every 4 (four) hours as needed for moderate pain. 40 tablet 0  . ibuprofen (ADVIL,MOTRIN) 200 MG tablet Take 200 mg by mouth every 6 (six) hours as needed (Pain).    . Multiple Vitamins-Minerals (MULTIVITAMIN WITH MINERALS) tablet Take 1 tablet by mouth daily.    . predniSONE (DELTASONE) 10 MG tablet Take 40mg  x 2 days, 30mg  x 2 days, 20mg  x 2 days, 10mg  x 2 days then STOP 20 tablet 0  . rosuvastatin (CRESTOR) 40 MG tablet TAKE 1 TABLET BY MOUTH EVERY DAY (Patient taking differently: Take 40 mg by mouth daily.) 90 tablet 1  . clindamycin (CLEOCIN) 300 MG capsule Take 1 capsule (300 mg total) by mouth 4 (four) times daily. (Patient not taking: No sig reported) 28 capsule 0   No facility-administered medications prior to visit.    Review of Systems  Constitutional: Negative for chills, fever, malaise/fatigue and weight loss.  HENT: Negative for hearing loss, sore throat and tinnitus.   Eyes: Negative for blurred vision and double vision.  Respiratory: Negative for cough, hemoptysis, sputum  production, shortness of breath, wheezing and stridor.   Cardiovascular: Negative for chest pain, palpitations, orthopnea, leg swelling and PND.  Gastrointestinal: Negative for abdominal pain, constipation, diarrhea, heartburn, nausea and vomiting.  Genitourinary: Negative for dysuria, hematuria and urgency.  Musculoskeletal: Negative for joint pain and myalgias.  Skin: Negative for itching and rash.  Neurological: Negative for dizziness, tingling, weakness and headaches.  Endo/Heme/Allergies: Negative for environmental allergies. Does not bruise/bleed easily.  Psychiatric/Behavioral: Negative for depression. The patient is nervous/anxious. The patient does not have insomnia.   All other systems reviewed and are negative.    Objective:  Physical Exam Vitals reviewed.  Constitutional:      General: He is not in acute distress.  Appearance: He is well-developed.  HENT:     Head: Normocephalic and atraumatic.     Mouth/Throat:     Mouth: Oropharynx is clear and moist.     Pharynx: No oropharyngeal exudate.  Eyes:     Extraocular Movements: EOM normal.     Conjunctiva/sclera: Conjunctivae normal.     Pupils: Pupils are equal, round, and reactive to light.  Neck:     Vascular: No JVD.     Trachea: No tracheal deviation.     Comments: Loss of supraclavicular fat Cardiovascular:     Rate and Rhythm: Normal rate and regular rhythm.     Pulses: Intact distal pulses.     Heart sounds: S1 normal and S2 normal.     Comments: Distant heart tones Pulmonary:     Effort: No tachypnea or accessory muscle usage.     Breath sounds: No stridor. Decreased breath sounds (throughout all lung fields) present. No wheezing, rhonchi or rales.     Comments: Increased AP chest diameter Abdominal:     General: Bowel sounds are normal. There is no distension.     Palpations: Abdomen is soft.     Tenderness: There is no abdominal tenderness.  Musculoskeletal:        General: Deformity (muscle wasting  ) present. No edema.  Skin:    General: Skin is warm and dry.     Capillary Refill: Capillary refill takes less than 2 seconds.     Findings: No rash.  Neurological:     Mental Status: He is alert and oriented to person, place, and time.  Psychiatric:        Mood and Affect: Mood and affect normal.        Behavior: Behavior normal.      Vitals:   12/08/20 1332  BP: 122/70  Pulse: 88  Temp: 97.9 F (36.6 C)  TempSrc: Tympanic  SpO2: 97%  Weight: 170 lb 2 oz (77.2 kg)  Height: 6' (1.829 m)   97% on RA BMI Readings from Last 3 Encounters:  12/08/20 23.07 kg/m  11/29/20 22.95 kg/m  11/19/20 23.06 kg/m   Wt Readings from Last 3 Encounters:  12/08/20 170 lb 2 oz (77.2 kg)  11/29/20 169 lb 3.2 oz (76.7 kg)  11/19/20 170 lb (77.1 kg)     CBC    Component Value Date/Time   WBC 15.9 (H) 11/19/2020 1219   RBC 3.83 (L) 11/19/2020 1219   HGB 10.9 (L) 11/19/2020 1219   HCT 35.2 (L) 11/19/2020 1219   PLT 530 (H) 11/19/2020 1219   MCV 91.9 11/19/2020 1219   MCH 28.5 11/19/2020 1219   MCHC 31.0 11/19/2020 1219   RDW 14.2 11/19/2020 1219   LYMPHSABS 2.0 10/13/2020 1431   MONOABS 1.2 (H) 10/13/2020 1431   EOSABS 0.1 10/13/2020 1431   BASOSABS 0.1 10/13/2020 1431    Chest Imaging: CT chest 11/04/2020: Large right upper lobe lung mass greatest cross-section of almost 6 cm, large 12 cm multilobulated hepatic mass either concerning for metastatic disease versus second primary.  Mediastinal adenopathy right paratracheal adenopathy, right hilar adenopathy. The patient's images have been independently reviewed by me.    Pulmonary Functions Testing Results: No flowsheet data found.  FeNO:   Pathology:  CT-guided liver biopsy 11/19/2020: Immune stains consistent with primary hepatocellular carcinoma  Echocardiogram:   Heart Catheterization:     Assessment & Plan:     ICD-10-CM   1. Lung mass  R91.8 Ambulatory referral to Pulmonology  AMB REFERRAL FOR DME  2.  Mediastinal adenopathy  R59.0 Ambulatory referral to Pulmonology  3. Liver mass  R16.0   4. On continuous oral anticoagulation  Z79.01   5. Hepatocellular carcinoma ([place])  C22.0     Discussion:  This is a 64 year old gentleman with multiple medical problems all new diagnoses, likely to advanced age cancers.  Lung mass with associated mediastinal adenopathy which is likely a primary lung cancer.  The liver mass has been biopsied consistent with a hepatocellular carcinoma.  He also is on continuous oral anticoagulation with Eliquis therefore this will limit our time on getting him biopsy.  Was hoping to consider biopsy as early as tomorrow afternoon.  Next available date will be Tuesday of next week in the afternoon.  Plan: Today in the office we discussed risk benefits and alternatives of proceeding with tissue diagnosis and biopsy. We discussed video bronchoscopy with endobronchial ultrasound transbronchial needle aspirations. Patient is agreeable to proceed.  We also may consider transbronchial biopsies for the primary lung mass to ensure adequate tissue for molecular genetics.  We discussed this today in the office. As for the patient's likely diagnosis of COPD he does have associated emphysema on his CT imaging. We will start patient on Trelegy inhaler.  Continue use of albuterol as needed for shortness of breath and wheezing. Start use of albuterol nebulizer solution with a new albuterol nebulizer machine and DME supplies to be sent as well.  We appreciate the referral.  Patient will likely have several telephone calls over the next couple of days helping set up his appointments.  Patient asked to hold Eliquis starting this Saturday.    Current Outpatient Medications:  .  albuterol (VENTOLIN HFA) 108 (90 Base) MCG/ACT inhaler, Inhale 2 puffs into the lungs every 6 (six) hours as needed for wheezing or shortness of breath., Disp: 8 g, Rfl: 2 .  carvedilol (COREG) 25 MG  tablet, TAKE 0.5 TABLETS (12.5 MG TOTAL) BY MOUTH 2 (TWO) TIMES DAILY WITH A MEAL., Disp: 90 tablet, Rfl: 1 .  cholecalciferol (VITAMIN D3) 25 MCG (1000 UNIT) tablet, Take 3,000 Units by mouth daily., Disp: , Rfl:  .  diltiazem (CARDIZEM CD) 180 MG 24 hr capsule, Take 1 capsule (180 mg total) by mouth daily., Disp: 90 capsule, Rfl: 3 .  ELIQUIS 5 MG TABS tablet, TAKE 1 TABLET BY MOUTH TWICE A DAY (Patient taking differently: Take 5 mg by mouth 2 (two) times daily.), Disp: 180 tablet, Rfl: 2 .  HYDROcodone-acetaminophen (NORCO/VICODIN) 5-325 MG tablet, Take 1 tablet by mouth every 4 (four) hours as needed for moderate pain., Disp: 40 tablet, Rfl: 0 .  ibuprofen (ADVIL,MOTRIN) 200 MG tablet, Take 200 mg by mouth every 6 (six) hours as needed (Pain)., Disp: , Rfl:  .  Multiple Vitamins-Minerals (MULTIVITAMIN WITH MINERALS) tablet, Take 1 tablet by mouth daily., Disp: , Rfl:  .  predniSONE (DELTASONE) 10 MG tablet, Take 40mg  x 2 days, 30mg  x 2 days, 20mg  x 2 days, 10mg  x 2 days then STOP, Disp: 20 tablet, Rfl: 0 .  rosuvastatin (CRESTOR) 40 MG tablet, TAKE 1 TABLET BY MOUTH EVERY DAY (Patient taking differently: Take 40 mg by mouth daily.), Disp: 90 tablet, Rfl: 1 .  clindamycin (CLEOCIN) 300 MG capsule, Take 1 capsule (300 mg total) by mouth 4 (four) times daily. (Patient not taking: No sig reported), Disp: 28 capsule, Rfl: 0  I spent 65 minutes dedicated to the care of this patient on the date of this encounter  to include pre-visit review of records, face-to-face time with the patient discussing conditions above, post visit ordering of testing, clinical documentation with the electronic health record, making appropriate referrals as documented, and communicating necessary findings to members of the patients care team.   Garner Nash, DO [facility] 12/08/2020 1:51 PM

## 2020-12-08 NOTE — H&P (View-Only) (Signed)
Synopsis: Referred in December 2021 for lung mass by Biagio Borg, MD  Subjective:   PATIENT ID: Daniel Richard GENDER: male DOB: Jan 03, 1956, MRN: [id number]  Chief Complaint  Patient presents with  . Consult    Eval spot on lung  per Dr. Benay Spice    This is a 64 year old gentleman, former smoker quit in 2009.  Past medical history of hyperlipidemia hypertension, CT imaging with emphysema present.  No prior pulmonary function tests.  Also has AICD, previous MI, PAF.  He is currently prescribed Eliquis.  Imaging was reviewed by interventional radiology recommending bronchoscopic evaluation tissue biopsy for lung mass.  Patient initial CT imaging that was completed in November 2021 revealed a large hepatic mass.  The hepatic mass was consistent with a poorly differentiated carcinoma of the liver.  Therefore due to the concern of whether or not we were dealing with a metastatic lesion from the lung versus primary liver immune O staining was completed on the tissue which was most consistent with a poorly differentiated hepatocellular carcinoma.  Therefore the decision was made for pursuing biopsy of the lung.  OV 12/08/2020: Patient feels much better from a respiratory standpoint.  Super anxious about everything that has transpired over the past couple of weeks.  He has lost about 20 pounds.  Denies hemoptysis.  He was having an exacerbation of his likely underlying diagnosis of COPD which is better now after completion of course of steroids.  He is not on any maintenance and inhalers.  Currently using as needed albuterol.   Past Medical History:  Diagnosis Date  . AICD (automatic cardioverter/defibrillator) present    ST JUDE  . ALLERGIC RHINITIS   . ANXIETY   . Arthritis    IN FINGERS  . Cardiac arrest ([place]) 11/21/2015  . Cardiac device in situ   . Coronary artery disease   . HYPERLIPIDEMIA   . HYPERTENSION   . LOW BACK PAIN   . Myocardial infarction ([place]) 11/21/2015  . Paroxysmal  atrial fibrillation ([facility])      Family History  Problem Relation Age of Onset  . Hypertension Other      Past Surgical History:  Procedure Laterality Date  . CARDIAC CATHETERIZATION N/A 11/21/2015   Procedure: Left Heart Cath and Coronary Angiography;  Surgeon: Wellington Hampshire, MD;  Location: [facility];  Service: Cardiovascular;  Laterality: N/A;  . EP IMPLANTABLE DEVICE N/A 11/29/2015   Procedure: ICD Implant;  Surgeon: Deboraha Sprang, MD;  Location: [facility];  Service: Cardiovascular;  Laterality: N/A;  . INGUINAL HERNIA REPAIR Left 02/09/2017   Procedure: OPEN REPAIR LEFT INGUINAL HERNIA ;  Surgeon: Greer Pickerel, MD;  Location: [facility];  Service: General;  Laterality: Left;  . INSERTION OF MESH Left 02/09/2017   Procedure: INSERTION OF MESH;  Surgeon: Greer Pickerel, MD;  Location: [facility];  Service: General;  Laterality: Left;    Social History   Socioeconomic History  . Marital status: Married    Spouse name: Not on file  . Number of children: Not on file  . Years of education: Not on file  . Highest education level: Not on file  Occupational History  . Occupation: Maintenance/painting     Employer: [employer]  Tobacco Use  . Smoking status: Former Smoker    Packs/day: 1.00    Years: 20.00    Pack years: 20.00    Types: Cigarettes    Quit date: 12/12/2007    Years since quitting:  13.0  . Smokeless tobacco: Never Used  Substance and Sexual Activity  . Alcohol use: Yes    Comment: occ  . Drug use: No  . Sexual activity: Not on file  Other Topics Concern  . Not on file  Social History Narrative   Lives with wife and teenage daughter   Social Determinants of Health   Financial Resource Strain: Not on file  Food Insecurity: Not on file  Transportation Needs: Not on file  Physical Activity: Not on file  Stress: Not on file  Social Connections: Not on file  Intimate Partner Violence: Not on file     Allergies  Allergen Reactions  . Lipitor  [Atorvastatin]     myalgia  . Meperidine Hcl Other (See Comments)    "TURNED WHITE AS A SHEEP, SWEATY COULD NOT STAND" DEMEROL     Outpatient Medications Prior to Visit  Medication Sig Dispense Refill  . albuterol (VENTOLIN HFA) 108 (90 Base) MCG/ACT inhaler Inhale 2 puffs into the lungs every 6 (six) hours as needed for wheezing or shortness of breath. 8 g 2  . carvedilol (COREG) 25 MG tablet TAKE 0.5 TABLETS (12.5 MG TOTAL) BY MOUTH 2 (TWO) TIMES DAILY WITH A MEAL. 90 tablet 1  . cholecalciferol (VITAMIN D3) 25 MCG (1000 UNIT) tablet Take 3,000 Units by mouth daily.    Marland Kitchen diltiazem (CARDIZEM CD) 180 MG 24 hr capsule Take 1 capsule (180 mg total) by mouth daily. 90 capsule 3  . ELIQUIS 5 MG TABS tablet TAKE 1 TABLET BY MOUTH TWICE A DAY (Patient taking differently: Take 5 mg by mouth 2 (two) times daily.) 180 tablet 2  . HYDROcodone-acetaminophen (NORCO/VICODIN) 5-325 MG tablet Take 1 tablet by mouth every 4 (four) hours as needed for moderate pain. 40 tablet 0  . ibuprofen (ADVIL,MOTRIN) 200 MG tablet Take 200 mg by mouth every 6 (six) hours as needed (Pain).    . Multiple Vitamins-Minerals (MULTIVITAMIN WITH MINERALS) tablet Take 1 tablet by mouth daily.    . predniSONE (DELTASONE) 10 MG tablet Take 40mg  x 2 days, 30mg  x 2 days, 20mg  x 2 days, 10mg  x 2 days then STOP 20 tablet 0  . rosuvastatin (CRESTOR) 40 MG tablet TAKE 1 TABLET BY MOUTH EVERY DAY (Patient taking differently: Take 40 mg by mouth daily.) 90 tablet 1  . clindamycin (CLEOCIN) 300 MG capsule Take 1 capsule (300 mg total) by mouth 4 (four) times daily. (Patient not taking: No sig reported) 28 capsule 0   No facility-administered medications prior to visit.    Review of Systems  Constitutional: Negative for chills, fever, malaise/fatigue and weight loss.  HENT: Negative for hearing loss, sore throat and tinnitus.   Eyes: Negative for blurred vision and double vision.  Respiratory: Negative for cough, hemoptysis, sputum  production, shortness of breath, wheezing and stridor.   Cardiovascular: Negative for chest pain, palpitations, orthopnea, leg swelling and PND.  Gastrointestinal: Negative for abdominal pain, constipation, diarrhea, heartburn, nausea and vomiting.  Genitourinary: Negative for dysuria, hematuria and urgency.  Musculoskeletal: Negative for joint pain and myalgias.  Skin: Negative for itching and rash.  Neurological: Negative for dizziness, tingling, weakness and headaches.  Endo/Heme/Allergies: Negative for environmental allergies. Does not bruise/bleed easily.  Psychiatric/Behavioral: Negative for depression. The patient is nervous/anxious. The patient does not have insomnia.   All other systems reviewed and are negative.    Objective:  Physical Exam Vitals reviewed.  Constitutional:      General: He is not in acute distress.  Appearance: He is well-developed.  HENT:     Head: Normocephalic and atraumatic.     Mouth/Throat:     Mouth: Oropharynx is clear and moist.     Pharynx: No oropharyngeal exudate.  Eyes:     Extraocular Movements: EOM normal.     Conjunctiva/sclera: Conjunctivae normal.     Pupils: Pupils are equal, round, and reactive to light.  Neck:     Vascular: No JVD.     Trachea: No tracheal deviation.     Comments: Loss of supraclavicular fat Cardiovascular:     Rate and Rhythm: Normal rate and regular rhythm.     Pulses: Intact distal pulses.     Heart sounds: S1 normal and S2 normal.     Comments: Distant heart tones Pulmonary:     Effort: No tachypnea or accessory muscle usage.     Breath sounds: No stridor. Decreased breath sounds (throughout all lung fields) present. No wheezing, rhonchi or rales.     Comments: Increased AP chest diameter Abdominal:     General: Bowel sounds are normal. There is no distension.     Palpations: Abdomen is soft.     Tenderness: There is no abdominal tenderness.  Musculoskeletal:        General: Deformity (muscle wasting  ) present. No edema.  Skin:    General: Skin is warm and dry.     Capillary Refill: Capillary refill takes less than 2 seconds.     Findings: No rash.  Neurological:     Mental Status: He is alert and oriented to person, place, and time.  Psychiatric:        Mood and Affect: Mood and affect normal.        Behavior: Behavior normal.      Vitals:   12/08/20 1332  BP: 122/70  Pulse: 88  Temp: 97.9 F (36.6 C)  TempSrc: Tympanic  SpO2: 97%  Weight: 170 lb 2 oz (77.2 kg)  Height: 6' (1.829 m)   97% on RA BMI Readings from Last 3 Encounters:  12/08/20 23.07 kg/m  11/29/20 22.95 kg/m  11/19/20 23.06 kg/m   Wt Readings from Last 3 Encounters:  12/08/20 170 lb 2 oz (77.2 kg)  11/29/20 169 lb 3.2 oz (76.7 kg)  11/19/20 170 lb (77.1 kg)     CBC    Component Value Date/Time   WBC 15.9 (H) 11/19/2020 1219   RBC 3.83 (L) 11/19/2020 1219   HGB 10.9 (L) 11/19/2020 1219   HCT 35.2 (L) 11/19/2020 1219   PLT 530 (H) 11/19/2020 1219   MCV 91.9 11/19/2020 1219   MCH 28.5 11/19/2020 1219   MCHC 31.0 11/19/2020 1219   RDW 14.2 11/19/2020 1219   LYMPHSABS 2.0 10/13/2020 1431   MONOABS 1.2 (H) 10/13/2020 1431   EOSABS 0.1 10/13/2020 1431   BASOSABS 0.1 10/13/2020 1431    Chest Imaging: CT chest 11/04/2020: Large right upper lobe lung mass greatest cross-section of almost 6 cm, large 12 cm multilobulated hepatic mass either concerning for metastatic disease versus second primary.  Mediastinal adenopathy right paratracheal adenopathy, right hilar adenopathy. The patient's images have been independently reviewed by me.    Pulmonary Functions Testing Results: No flowsheet data found.  FeNO:   Pathology:  CT-guided liver biopsy 11/19/2020: Immune stains consistent with primary hepatocellular carcinoma  Echocardiogram:   Heart Catheterization:     Assessment & Plan:     ICD-10-CM   1. Lung mass  R91.8 Ambulatory referral to Pulmonology  AMB REFERRAL FOR DME  2.  Mediastinal adenopathy  R59.0 Ambulatory referral to Pulmonology  3. Liver mass  R16.0   4. On continuous oral anticoagulation  Z79.01   5. Hepatocellular carcinoma ([place])  C22.0     Discussion:  This is a 64 year old gentleman with multiple medical problems all new diagnoses, likely to advanced age cancers.  Lung mass with associated mediastinal adenopathy which is likely a primary lung cancer.  The liver mass has been biopsied consistent with a hepatocellular carcinoma.  He also is on continuous oral anticoagulation with Eliquis therefore this will limit our time on getting him biopsy.  Was hoping to consider biopsy as early as tomorrow afternoon.  Next available date will be Tuesday of next week in the afternoon.  Plan: Today in the office we discussed risk benefits and alternatives of proceeding with tissue diagnosis and biopsy. We discussed video bronchoscopy with endobronchial ultrasound transbronchial needle aspirations. Patient is agreeable to proceed.  We also may consider transbronchial biopsies for the primary lung mass to ensure adequate tissue for molecular genetics.  We discussed this today in the office. As for the patient's likely diagnosis of COPD he does have associated emphysema on his CT imaging. We will start patient on Trelegy inhaler.  Continue use of albuterol as needed for shortness of breath and wheezing. Start use of albuterol nebulizer solution with a new albuterol nebulizer machine and DME supplies to be sent as well.  We appreciate the referral.  Patient will likely have several telephone calls over the next couple of days helping set up his appointments.  Patient asked to hold Eliquis starting this Saturday.    Current Outpatient Medications:  .  albuterol (VENTOLIN HFA) 108 (90 Base) MCG/ACT inhaler, Inhale 2 puffs into the lungs every 6 (six) hours as needed for wheezing or shortness of breath., Disp: 8 g, Rfl: 2 .  carvedilol (COREG) 25 MG  tablet, TAKE 0.5 TABLETS (12.5 MG TOTAL) BY MOUTH 2 (TWO) TIMES DAILY WITH A MEAL., Disp: 90 tablet, Rfl: 1 .  cholecalciferol (VITAMIN D3) 25 MCG (1000 UNIT) tablet, Take 3,000 Units by mouth daily., Disp: , Rfl:  .  diltiazem (CARDIZEM CD) 180 MG 24 hr capsule, Take 1 capsule (180 mg total) by mouth daily., Disp: 90 capsule, Rfl: 3 .  ELIQUIS 5 MG TABS tablet, TAKE 1 TABLET BY MOUTH TWICE A DAY (Patient taking differently: Take 5 mg by mouth 2 (two) times daily.), Disp: 180 tablet, Rfl: 2 .  HYDROcodone-acetaminophen (NORCO/VICODIN) 5-325 MG tablet, Take 1 tablet by mouth every 4 (four) hours as needed for moderate pain., Disp: 40 tablet, Rfl: 0 .  ibuprofen (ADVIL,MOTRIN) 200 MG tablet, Take 200 mg by mouth every 6 (six) hours as needed (Pain)., Disp: , Rfl:  .  Multiple Vitamins-Minerals (MULTIVITAMIN WITH MINERALS) tablet, Take 1 tablet by mouth daily., Disp: , Rfl:  .  predniSONE (DELTASONE) 10 MG tablet, Take 40mg  x 2 days, 30mg  x 2 days, 20mg  x 2 days, 10mg  x 2 days then STOP, Disp: 20 tablet, Rfl: 0 .  rosuvastatin (CRESTOR) 40 MG tablet, TAKE 1 TABLET BY MOUTH EVERY DAY (Patient taking differently: Take 40 mg by mouth daily.), Disp: 90 tablet, Rfl: 1 .  clindamycin (CLEOCIN) 300 MG capsule, Take 1 capsule (300 mg total) by mouth 4 (four) times daily. (Patient not taking: No sig reported), Disp: 28 capsule, Rfl: 0  I spent 65 minutes dedicated to the care of this patient on the date of this encounter  to include pre-visit review of records, face-to-face time with the patient discussing conditions above, post visit ordering of testing, clinical documentation with the electronic health record, making appropriate referrals as documented, and communicating necessary findings to members of the patients care team.   Garner Nash, DO [facility] 12/08/2020 1:51 PM

## 2020-12-08 NOTE — Telephone Encounter (Signed)
noted 

## 2020-12-08 NOTE — Patient Instructions (Addendum)
Thank you for visiting Dr. Valeta Harms at [facility]. Today we recommend the following:  Orders Placed This Encounter  Procedures  . Ambulatory referral to Pulmonology   Bronchoscopy will be scheduled on 12/14/2020 Please expect a call from pre-operative services Expect a call from our office regarding scheduling and times  Nothing to eat the night before past midnight.   STOP Eliquis on Saturday!   Return in about 8 weeks (around 02/02/2021) for with APP or Dr. Valeta Harms.    Please do your part to reduce the spread of COVID-19.

## 2020-12-08 NOTE — Telephone Encounter (Signed)
Pt has been scheduled for 1/4 at 1:30 at [place].  Pt is going tor covid test on 1/3 at 10:25.  I have given him appt info & reminded him to stay off eliquis for 2 days prior.

## 2020-12-09 ENCOUNTER — Inpatient Hospital Stay: Payer: BC Managed Care – PPO | Admitting: Nurse Practitioner

## 2020-12-09 ENCOUNTER — Inpatient Hospital Stay: Payer: BC Managed Care – PPO

## 2020-12-09 MED ORDER — ALBUTEROL SULFATE (2.5 MG/3ML) 0.083% IN NEBU: 2.5000 mg | INHALATION_SOLUTION | Freq: Four times a day (QID) | RESPIRATORY_TRACT | 6 refills | Status: AC | PRN

## 2020-12-09 NOTE — Addendum Note (Signed)
Addended by: Elie Confer on: 12/09/2020 10:53 AM   Modules accepted: Orders

## 2020-12-09 NOTE — Progress Notes (Signed)
Spoke with patient regarding today's scheduled appointments.  Explained no need to come in today to be seen per Dr. Benay Spice.  I have rescheduled to next Friday 12/17/2020 1:00 for labs and see provider at 1:45.  He was very appreciative of the call.

## 2020-12-12 ENCOUNTER — Other Ambulatory Visit: Payer: Self-pay | Admitting: Internal Medicine

## 2020-12-12 NOTE — Telephone Encounter (Signed)
Please refill as per office routine med refill policy (all routine meds refilled for 3 mo or monthly per pt preference up to one year from last visit, then month to month grace period for 3 mo, then further med refills will have to be denied)  

## 2020-12-13 ENCOUNTER — Telehealth: Payer: Self-pay | Admitting: Internal Medicine

## 2020-12-13 ENCOUNTER — Encounter (HOSPITAL_COMMUNITY): Payer: Self-pay | Admitting: Pulmonary Disease

## 2020-12-13 ENCOUNTER — Other Ambulatory Visit (HOSPITAL_COMMUNITY)
Admission: RE | Admit: 2020-12-13 | Discharge: 2020-12-13 | Disposition: A | Discharge status: Home / Self Care | Payer: BC Managed Care – PPO | Source: Ambulatory Visit | Attending: Pulmonary Disease | Admitting: Pulmonary Disease

## 2020-12-13 ENCOUNTER — Encounter: Payer: Self-pay | Admitting: Internal Medicine

## 2020-12-13 ENCOUNTER — Other Ambulatory Visit: Payer: Self-pay | Admitting: Internal Medicine

## 2020-12-13 DIAGNOSIS — Z01812 Encounter for preprocedural laboratory examination: Secondary | ICD-10-CM | POA: Insufficient documentation

## 2020-12-13 DIAGNOSIS — I480 Paroxysmal atrial fibrillation: Secondary | ICD-10-CM | POA: Diagnosis not present

## 2020-12-13 DIAGNOSIS — R918 Other nonspecific abnormal finding of lung field: Secondary | ICD-10-CM

## 2020-12-13 DIAGNOSIS — R590 Localized enlarged lymph nodes: Secondary | ICD-10-CM | POA: Diagnosis not present

## 2020-12-13 DIAGNOSIS — Z20822 Contact with and (suspected) exposure to covid-19: Secondary | ICD-10-CM | POA: Insufficient documentation

## 2020-12-13 DIAGNOSIS — Z9109 Other allergy status, other than to drugs and biological substances: Secondary | ICD-10-CM | POA: Diagnosis not present

## 2020-12-13 DIAGNOSIS — I252 Old myocardial infarction: Secondary | ICD-10-CM | POA: Diagnosis not present

## 2020-12-13 DIAGNOSIS — Z7901 Long term (current) use of anticoagulants: Secondary | ICD-10-CM | POA: Diagnosis not present

## 2020-12-13 DIAGNOSIS — Z79899 Other long term (current) drug therapy: Secondary | ICD-10-CM | POA: Diagnosis not present

## 2020-12-13 DIAGNOSIS — Z87891 Personal history of nicotine dependence: Secondary | ICD-10-CM | POA: Diagnosis not present

## 2020-12-13 DIAGNOSIS — C220 Liver cell carcinoma: Secondary | ICD-10-CM

## 2020-12-13 DIAGNOSIS — Z95810 Presence of automatic (implantable) cardiac defibrillator: Secondary | ICD-10-CM | POA: Diagnosis not present

## 2020-12-13 LAB — SARS CORONAVIRUS 2 (TAT 6-24 HRS): SARS Coronavirus 2: NEGATIVE

## 2020-12-13 MED ORDER — HYDROCODONE-ACETAMINOPHEN 5-325 MG PO TABS: 1.0000 | ORAL_TABLET | Freq: Four times a day (QID) | ORAL | 0 refills | Status: DC | PRN

## 2020-12-13 NOTE — Telephone Encounter (Signed)
Sent to Dr. John. 

## 2020-12-13 NOTE — Anesthesia Preprocedure Evaluation (Addendum)
Anesthesia Evaluation  Patient identified by MRN, date of birth, ID band Patient awake    Reviewed: Allergy & Precautions, NPO status , Patient's Chart, lab work & pertinent test results  History of Anesthesia Complications Negative for: history of anesthetic complications  Airway Mallampati: II  TM Distance: >3 FB Neck ROM: Full    Dental  (+) Teeth Intact   Pulmonary former smoker,  Lung mass   Pulmonary exam normal        Cardiovascular hypertension, + CAD, + Past MI (witnessed out of hospital V fib arrest in 2016; no targets for PCI identified) and +CHF  Normal cardiovascular exam+ dysrhythmias Atrial Fibrillation and Ventricular Fibrillation + Cardiac Defibrillator      Neuro/Psych negative neurological ROS  negative psych ROS   GI/Hepatic negative GI ROS, Neg liver ROS, Liver mass   Endo/Other  negative endocrine ROS  Renal/GU negative Renal ROS  negative genitourinary   Musculoskeletal negative musculoskeletal ROS (+)   Abdominal   Peds  Hematology negative hematology ROS (+)   Anesthesia Other Findings   Reproductive/Obstetrics                            Anesthesia Physical Anesthesia Plan  ASA: IV  Anesthesia Plan: General   Post-op Pain Management:    Induction: Intravenous  PONV Risk Score and Plan: 2 and Ondansetron, Dexamethasone, Treatment may vary due to age or medical condition and Midazolam  Airway Management Planned: Oral ETT  Additional Equipment: None  Intra-op Plan:   Post-operative Plan: Extubation in OR  Informed Consent: I have reviewed the patients History and Physical, chart, labs and discussed the procedure including the risks, benefits and alternatives for the proposed anesthesia with the patient or authorized representative who has indicated his/her understanding and acceptance.     Dental advisory given  Plan Discussed with:   Anesthesia  Plan Comments: (PAT note by Karoline Caldwell, PA-C: Pt suffered cardiac arrest 11/2015 and received St Jude ICD. Cardiac evaluation at that time initially demonstrated an EF of 40% with an occluded first diagonal with collaterals. Subsequent ejection fraction by echo was 55-60%.  MRI was incomplete without gadolinium because of claustrophobia overall ejection fraction was normal without regional wall motion abnormalities. He also has persistent afib maintained on Eliquis. Last remote device check 10/25/20 showed normal device function and per conclusion, "30 NSVT/1 SVT, all EGM's show AF/RVR. Overall rate histograms show better rate control. [place]- Eliquis, on Coreg and Diltiazem."  Pt reported LD Eliquis 12/10/20.  Recently discovered lung mass with associated mediastinal adenopathy which is felt to be likely a primary lung cancer.  Also recently found to have liver mass has been biopsied and is consistent with a hepatocellular carcinoma.  Will need DOS labs and eval.  Perioperative device recommendations.  Device Information:  Clinic EP Physician:Steven Caryl Comes, MD Device Type:Defibrillator Manufacturer and Phone #:St. Jude/[employer]: [phone] Pacemaker Dependent?:No Date of Last Device Check:Overdue for office visitonly remote availableNormal Device Function?:Yes  Electrophysiologist's Recommendations:  Industry must be present to check device before procedure. Patient overdue for follow -up. (Industry rep has been notified to be present)   EKG 06/18/19: Afib. Rate 98.  CT Chest/abd/pelvis 11/04/20: IMPRESSION: 1. 5.9 x 5.1 x 5.3 cm posterior right upper lobe pulmonary mass with mediastinal and right hilar lymphadenopathy compatible with lymph node metastases. Pulmonary lesion could represent primary bronchogenic neoplasm or metastatic disease given findings in the liver. 2. 12.7 x 11.6 x 12.4 cm  irregular multi lobular mass lesion versus collection of clustered  smaller lesions in the upper liver, largely replacing segments VII and VIII. Imaging features highly concerning for neoplasm, likely metastatic disease although primary hepatic neoplasm not excluded. Additional smaller lesions in the liver suggest metastatic involvement. 3. 8 mm subcapsular lesion lower pole left kidney is too small to characterize. Attention on follow-up recommended. 4. Aortic Atherosclerosis (ICD10-I70.0) and Emphysema (ICD10-J43.9).  TTE 11/22/15: - Left ventricle: The cavity size was normal. There was moderate  concentric hypertrophy. Systolic function was normal. The  estimated ejection fraction was in the range of 55% to 60%. Wall  motion was normal; there were no regional wall motion  abnormalities.  - Aortic valve: Severe diffuse thickening and calcification  involving the left coronary and noncoronary cusp.  - Mitral valve: Calcified annulus. There was trivial regurgitation.  LHC 11/21/15: 1. Left dominant system with significant branching disease. Occluded first diagonal is the most likely culprit for ventricular fibrillation. However, the vessel is 1.5 mm in diameter, appears to be diffusely diseased and already has some collaterals. Thus, PCI is not recommended. No evidence of obstructive disease in the main vessels.  2. Mildly to moderately reduced LV systolic function with an ejection fraction of 40% with anterolateral and apical hypokinesis.  3. Mildly elevated left ventricular end-diastolic pressure.  Recommendations: The patient presented with a witnessed out of hospital cardiac arrest. CPR was performed followed by defibrillation by EMS. EKG showed possible subtle anterolateral ST elevation. Based on the above findings, I recommend medical therapy. I am going to start the patient and dual antiplatelet therapy with Plavix and low-dose aspirin. Start high-dose statin if there is no contraindication. Wean off epinephrine drip gradually as  tolerated by blood pressure. The patient was agitated throughout the case and was trying to pull the ET tube. He was sedated with propofol. Wean off the vent as tolerated. I agree that the patient does not require hypothermia. )       Anesthesia Quick Evaluation

## 2020-12-13 NOTE — Progress Notes (Signed)
PCP:  Cathlean Cower, MD Cardiologist:  Virl Axe, MD  EKG:  DOS CXR:  11/29/20 ECHO:  11/22/15 Stress Test:  Denies Cardiac Cath:  11/21/15  Fasting Blood Sugar-  N/A Checks Blood Sugar__N/A_ times a day  ASA:  No Blood Thinners:  Last dose Eliquis 12/10/20  OSA/CPAP:  No  Covid test 12/14/19  Anesthesia Review:  Yes, CAD, AICD, Afib, MI.  St. Jude made aware of date and time of procedure.   Patient denies shortness of breath, fever, cough, and chest pain at PAT appointment.  Patient verbalized understanding of instructions provided today at the PAT appointment.  Patient asked to review instructions at home and day of surgery.

## 2020-12-13 NOTE — Telephone Encounter (Signed)
1.Medication Requested: HYDROcodone-acetaminophen (NORCO/VICODIN) 5-325 MG tablet 2. Pharmacy (Name, [place], [place]): [id number] [place], [address] Phone:  [phone]  Fax:  [phone]     3. On Med List: yes 4. Last Visit with PCP: 11.03.21 5. Next visit date with PCP: n/a  Agent: Please be advised that RX refills may take up to 3 business days. We ask that you follow-up with your pharmacy.

## 2020-12-13 NOTE — Progress Notes (Signed)
Anesthesia Chart Review: Same day workup  Pt suffered cardiac arrest 11/2015 and received St Jude ICD. Cardiac evaluation at that time initially demonstrated an EF of 40% with an occluded first diagonal with collaterals. Subsequent ejection fraction by echo was 55-60%.  MRI was incomplete without gadolinium because of claustrophobia overall ejection fraction was normal without regional wall motion abnormalities. He also has persistent afib maintained on Eliquis. Last remote device check 10/25/20 showed normal device function and per conclusion, "30 NSVT/1 SVT, all EGM's show AF/RVR. Overall rate histograms show better rate control. [place]- Eliquis, on Coreg and Diltiazem."  Pt reported LD Eliquis 12/10/20.  Recently discovered lung mass with associated mediastinal adenopathy which is felt to be likely a primary lung cancer.  Also recently found to have liver mass has been biopsied and is consistent with a hepatocellular carcinoma.  Will need DOS labs and eval.  Perioperative device recommendations.  Device Information:  Clinic EP Physician:Steven Caryl Comes, MD Device Type:Defibrillator Manufacturer and Phone #:St. Jude/Abbott: [phone] Pacemaker Dependent?:No Date of Last Device Check:Overdue for office visitonly remote availableNormal Device Function?:Yes  Electrophysiologist's Recommendations:   Industry must be present to check device before procedure. Patient overdue for follow -up. (Industry rep has been notified to be present)   EKG 06/18/19: Afib. Rate 98.  CT Chest/abd/pelvis 11/04/20: IMPRESSION: 1. 5.9 x 5.1 x 5.3 cm posterior right upper lobe pulmonary mass with mediastinal and right hilar lymphadenopathy compatible with lymph node metastases. Pulmonary lesion could represent primary bronchogenic neoplasm or metastatic disease given findings in the liver. 2. 12.7 x 11.6 x 12.4 cm irregular multi lobular mass lesion versus collection of clustered  smaller lesions in the upper liver, largely replacing segments VII and VIII. Imaging features highly concerning for neoplasm, likely metastatic disease although primary hepatic neoplasm not excluded. Additional smaller lesions in the liver suggest metastatic involvement. 3. 8 mm subcapsular lesion lower pole left kidney is too small to characterize. Attention on follow-up recommended. 4. Aortic Atherosclerosis (ICD10-I70.0) and Emphysema (ICD10-J43.9).  TTE 11/22/15: - Left ventricle: The cavity size was normal. There was moderate  concentric hypertrophy. Systolic function was normal. The  estimated ejection fraction was in the range of 55% to 60%. Wall  motion was normal; there were no regional wall motion  abnormalities.  - Aortic valve: Severe diffuse thickening and calcification  involving the left coronary and noncoronary cusp.  - Mitral valve: Calcified annulus. There was trivial regurgitation.  LHC 11/21/15: 1. Left dominant system with significant branching disease. Occluded first diagonal is the most likely culprit for ventricular fibrillation. However, the vessel is 1.5 mm in diameter, appears to be diffusely diseased and already has some collaterals. Thus, PCI is not recommended. No evidence of obstructive disease in the main vessels.  2. Mildly to moderately reduced LV systolic function with an ejection fraction of 40% with anterolateral and apical hypokinesis.  3. Mildly elevated left ventricular end-diastolic pressure.  Recommendations: The patient presented with a witnessed out of hospital cardiac arrest. CPR was performed followed by defibrillation by EMS. EKG showed possible subtle anterolateral ST elevation. Based on the above findings, I recommend medical therapy. I am going to start the patient and dual antiplatelet therapy with Plavix and low-dose aspirin. Start high-dose statin if there is no contraindication. Wean off epinephrine drip gradually as  tolerated by blood pressure. The patient was agitated throughout the case and was trying to pull the ET tube. He was sedated with propofol. Wean off the vent as tolerated. I agree that the  patient does not require hypothermia.    Wynonia Musty [facility]/Anesthesiology Phone [phone] 12/13/2020 2:47 PM

## 2020-12-13 NOTE — Progress Notes (Signed)
Spoke with Windle Guard, [place] Rep. He is aware of procedure date and time.

## 2020-12-13 NOTE — Progress Notes (Signed)
PERIOPERATIVE PRESCRIPTION FOR IMPLANTED CARDIAC DEVICE PROGRAMMING   Patient Information:   Patient: Daniel Richard  DOB: 06/14/1956  MRN: [id number]   Planned Procedure: Video Bronchoscopy with Endobronchial Ultrasound  Surgeon: Dr. Valeta Harms  Date of Procedure: 12/15/19  Time: 1300  [place] will be used.  Position during surgery:    Please send documentation back to:  [facility] (Fax # [phone])   Donata Clay, RN  08/24/2020 10:39 AM        Device Information:   Clinic EP Physician:   Virl Axe, MD Device Type:  Defibrillator Manufacturer and Phone #:  St. Jude/Abbott: [phone] Pacemaker Dependent?:  No Date of Last Device Check:Overdue for office visit    only remote available Normal Device Function?:  Yes     Electrophysiologist's Recommendations:    Industry must be present to check device before procedure. Patient overdue for follow -up.  Per Device Clinic Standing Orders, Drake Leach  12/13/2020 2:14 PM

## 2020-12-13 NOTE — Progress Notes (Signed)
Per Daniel Richard. Daniel Richard needs to be here DOS as patient is overdue for follow up.  Daniel Richard stated he would be here at 1100.

## 2020-12-14 ENCOUNTER — Ambulatory Visit (HOSPITAL_COMMUNITY): Payer: BC Managed Care – PPO

## 2020-12-14 ENCOUNTER — Other Ambulatory Visit: Payer: Self-pay

## 2020-12-14 ENCOUNTER — Ambulatory Visit (HOSPITAL_COMMUNITY): Payer: BC Managed Care – PPO | Admitting: Physician Assistant

## 2020-12-14 ENCOUNTER — Encounter (HOSPITAL_COMMUNITY): Payer: Self-pay | Admitting: Pulmonary Disease

## 2020-12-14 ENCOUNTER — Encounter (HOSPITAL_COMMUNITY)
Admission: RE | Disposition: A | Discharge status: Home / Self Care | Payer: Self-pay | Source: Home / Self Care | Attending: Pulmonary Disease

## 2020-12-14 ENCOUNTER — Ambulatory Visit (HOSPITAL_COMMUNITY)
Admission: RE | Admit: 2020-12-14 | Discharge: 2020-12-14 | Disposition: A | Discharge status: Home / Self Care | Payer: BC Managed Care – PPO | Attending: Pulmonary Disease | Admitting: Pulmonary Disease

## 2020-12-14 DIAGNOSIS — Z7901 Long term (current) use of anticoagulants: Secondary | ICD-10-CM | POA: Insufficient documentation

## 2020-12-14 DIAGNOSIS — Z95810 Presence of automatic (implantable) cardiac defibrillator: Secondary | ICD-10-CM | POA: Insufficient documentation

## 2020-12-14 DIAGNOSIS — R590 Localized enlarged lymph nodes: Secondary | ICD-10-CM | POA: Insufficient documentation

## 2020-12-14 DIAGNOSIS — I252 Old myocardial infarction: Secondary | ICD-10-CM | POA: Insufficient documentation

## 2020-12-14 DIAGNOSIS — Z9889 Other specified postprocedural states: Secondary | ICD-10-CM | POA: Diagnosis not present

## 2020-12-14 DIAGNOSIS — Z9109 Other allergy status, other than to drugs and biological substances: Secondary | ICD-10-CM | POA: Insufficient documentation

## 2020-12-14 DIAGNOSIS — I480 Paroxysmal atrial fibrillation: Secondary | ICD-10-CM | POA: Diagnosis not present

## 2020-12-14 DIAGNOSIS — Z20822 Contact with and (suspected) exposure to covid-19: Secondary | ICD-10-CM | POA: Diagnosis not present

## 2020-12-14 DIAGNOSIS — Z87891 Personal history of nicotine dependence: Secondary | ICD-10-CM | POA: Insufficient documentation

## 2020-12-14 DIAGNOSIS — I700 Atherosclerosis of aorta: Secondary | ICD-10-CM | POA: Diagnosis not present

## 2020-12-14 DIAGNOSIS — R918 Other nonspecific abnormal finding of lung field: Secondary | ICD-10-CM

## 2020-12-14 DIAGNOSIS — Z0389 Encounter for observation for other suspected diseases and conditions ruled out: Secondary | ICD-10-CM | POA: Diagnosis not present

## 2020-12-14 DIAGNOSIS — Z79899 Other long term (current) drug therapy: Secondary | ICD-10-CM | POA: Diagnosis not present

## 2020-12-14 HISTORY — PX: VIDEO BRONCHOSCOPY WITH ENDOBRONCHIAL ULTRASOUND: SHX6177

## 2020-12-14 HISTORY — PX: BRONCHIAL NEEDLE ASPIRATION BIOPSY: SHX5106

## 2020-12-14 HISTORY — PX: BRONCHIAL BIOPSY: SHX5109

## 2020-12-14 LAB — BASIC METABOLIC PANEL
Anion gap: 12 (ref 5–15)
BUN: 9 mg/dL (ref 8–23)
CO2: 22 mmol/L (ref 22–32)
Calcium: 8.8 mg/dL — ABNORMAL LOW (ref 8.9–10.3)
Chloride: 97 mmol/L — ABNORMAL LOW (ref 98–111)
Creatinine, Ser: 0.86 mg/dL (ref 0.61–1.24)
GFR, Estimated: >60 mL/min (ref >60)
Glucose, Bld: 110 mg/dL — ABNORMAL HIGH (ref 70–99)
Potassium: 4 mmol/L (ref 3.5–5.1)
Sodium: 131 mmol/L — ABNORMAL LOW (ref 135–145)

## 2020-12-14 LAB — CBC
HCT: 29.4 % — ABNORMAL LOW (ref 39.0–52.0)
Hemoglobin: 8.6 g/dL — ABNORMAL LOW (ref 13.0–17.0)
MCH: 26.4 pg (ref 26.0–34.0)
MCHC: 29.3 g/dL — ABNORMAL LOW (ref 30.0–36.0)
MCV: 90.2 fL (ref 80.0–100.0)
Platelets: 377 10*3/uL (ref 150–400)
RBC: 3.26 MIL/uL — ABNORMAL LOW (ref 4.22–5.81)
RDW: 16.6 % — ABNORMAL HIGH (ref 11.5–15.5)
WBC: 26 10*3/uL — ABNORMAL HIGH (ref 4.0–10.5)
nRBC: 0 % (ref 0.0–0.2)

## 2020-12-14 SURGERY — BRONCHOSCOPY, WITH EBUS
Anesthesia: General

## 2020-12-14 MED ORDER — ROCURONIUM BROMIDE 10 MG/ML (PF) SYRINGE
PREFILLED_SYRINGE | INTRAVENOUS | Status: DC | PRN
  Administered 2020-12-14: 70 mg via INTRAVENOUS

## 2020-12-14 MED ORDER — LACTATED RINGERS IV SOLN: INTRAVENOUS | Status: DC

## 2020-12-14 MED ORDER — ALBUMIN HUMAN 5 % IV SOLN: INTRAVENOUS | Status: DC | PRN

## 2020-12-14 MED ORDER — PROPOFOL 10 MG/ML IV BOLUS
INTRAVENOUS | Status: DC | PRN
  Administered 2020-12-14: 140 mg via INTRAVENOUS

## 2020-12-14 MED ORDER — PHENYLEPHRINE 40 MCG/ML (10ML) SYRINGE FOR IV PUSH (FOR BLOOD PRESSURE SUPPORT)
PREFILLED_SYRINGE | INTRAVENOUS | Status: DC | PRN
  Administered 2020-12-14 (×3): 80 ug via INTRAVENOUS
  Administered 2020-12-14 (×3): 120 ug via INTRAVENOUS
  Administered 2020-12-14: 320 ug via INTRAVENOUS

## 2020-12-14 MED ORDER — SUGAMMADEX SODIUM 200 MG/2ML IV SOLN
INTRAVENOUS | Status: DC | PRN
  Administered 2020-12-14: 100 mg via INTRAVENOUS
  Administered 2020-12-14: 200 mg via INTRAVENOUS

## 2020-12-14 MED ORDER — CHLORHEXIDINE GLUCONATE 0.12 % MT SOLN
15.0000 mL | Freq: Once | OROMUCOSAL | Status: DC
  Filled 2020-12-14 (×2): qty 15

## 2020-12-14 MED ORDER — FENTANYL CITRATE (PF) 250 MCG/5ML IJ SOLN
INTRAMUSCULAR | Status: DC | PRN
  Administered 2020-12-14: 100 ug via INTRAVENOUS

## 2020-12-14 MED ORDER — ONDANSETRON HCL 4 MG/2ML IJ SOLN
INTRAMUSCULAR | Status: DC | PRN
  Administered 2020-12-14: 4 mg via INTRAVENOUS

## 2020-12-14 MED ORDER — LIDOCAINE 2% (20 MG/ML) 5 ML SYRINGE
INTRAMUSCULAR | Status: DC | PRN
  Administered 2020-12-14: 60 mg via INTRAVENOUS

## 2020-12-14 MED ORDER — PHENYLEPHRINE HCL-NACL 10-0.9 MG/250ML-% IV SOLN
INTRAVENOUS | Status: DC | PRN
  Administered 2020-12-14: 40 ug/min via INTRAVENOUS

## 2020-12-14 MED ORDER — DEXAMETHASONE SODIUM PHOSPHATE 10 MG/ML IJ SOLN
INTRAMUSCULAR | Status: DC | PRN
  Administered 2020-12-14: 5 mg via INTRAVENOUS

## 2020-12-14 MED ORDER — MIDAZOLAM HCL 2 MG/2ML IJ SOLN
INTRAMUSCULAR | Status: DC | PRN
  Administered 2020-12-14: 1 mg via INTRAVENOUS

## 2020-12-14 SURGICAL SUPPLY — 29 items
BRUSH CYTOL CELLEBRITY 1.5X140 (MISCELLANEOUS) IMPLANT
CANISTER SUCT 3000ML PPV (MISCELLANEOUS) ×3 IMPLANT
CONT SPEC 4OZ CLIKSEAL STRL BL (MISCELLANEOUS) ×3 IMPLANT
COVER BACK TABLE 60X90IN (DRAPES) ×3 IMPLANT
COVER DOME SNAP 22 D (MISCELLANEOUS) ×3 IMPLANT
FORCEPS BIOP RJ4 1.8 (CUTTING FORCEPS) IMPLANT
GAUZE SPONGE 4X4 12PLY STRL (GAUZE/BANDAGES/DRESSINGS) ×3 IMPLANT
GLOVE BIO SURGEON STRL SZ7.5 (GLOVE) ×3 IMPLANT
GOWN STRL REUS W/ TWL LRG LVL3 (GOWN DISPOSABLE) ×2 IMPLANT
GOWN STRL REUS W/TWL LRG LVL3 (GOWN DISPOSABLE) ×3
KIT CLEAN ENDO COMPLIANCE (KITS) ×6 IMPLANT
KIT TURNOVER KIT B (KITS) ×3 IMPLANT
MARKER SKIN DUAL TIP RULER LAB (MISCELLANEOUS) ×3 IMPLANT
NEEDLE EBUS SONO TIP PENTAX (NEEDLE) ×3 IMPLANT
NS IRRIG 1000ML POUR BTL (IV SOLUTION) ×3 IMPLANT
OIL SILICONE PENTAX (PARTS (SERVICE/REPAIRS)) ×3 IMPLANT
PAD ARMBOARD 7.5X6 YLW CONV (MISCELLANEOUS) ×6 IMPLANT
SOL ANTI FOG 6CC (MISCELLANEOUS) ×2 IMPLANT
SOLUTION ANTI FOG 6CC (MISCELLANEOUS) ×1
SYR 20CC LL (SYRINGE) ×6 IMPLANT
SYR 20ML ECCENTRIC (SYRINGE) ×6 IMPLANT
SYR 50ML SLIP (SYRINGE) IMPLANT
SYR 5ML LUER SLIP (SYRINGE) ×3 IMPLANT
TOWEL OR 17X24 6PK STRL BLUE (TOWEL DISPOSABLE) ×3 IMPLANT
TRAP SPECIMEN MUCOUS 40CC (MISCELLANEOUS) IMPLANT
TUBE CONNECTING 20X1/4 (TUBING) ×6 IMPLANT
UNDERPAD 30X30 (UNDERPADS AND DIAPERS) ×3 IMPLANT
VALVE DISPOSABLE (MISCELLANEOUS) ×3 IMPLANT
WATER STERILE IRR 1000ML POUR (IV SOLUTION) ×3 IMPLANT

## 2020-12-14 NOTE — Anesthesia Procedure Notes (Signed)
Procedure Name: Intubation Date/Time: 12/14/2020 1:03 PM Performed by: Dorthea Cove, CRNA Pre-anesthesia Checklist: Patient identified, Emergency Drugs available, Suction available and Patient being monitored Patient Re-evaluated:Patient Re-evaluated prior to induction Oxygen Delivery Method: Circle system utilized Preoxygenation: Pre-oxygenation with 100% oxygen Induction Type: IV induction Ventilation: Mask ventilation without difficulty Laryngoscope Size: Glidescope and 3 Grade View: Grade I Tube type: Oral Tube size: 8.5 mm Number of attempts: 1 Airway Equipment and Method: Stylet and Oral airway Placement Confirmation: ETT inserted through vocal cords under direct vision,  positive ETCO2 and breath sounds checked- equal and bilateral Secured at: 23 cm Tube secured with: Tape Dental Injury: Teeth and Oropharynx as per pre-operative assessment

## 2020-12-14 NOTE — Discharge Instructions (Signed)
Flexible Bronchoscopy, Care After This sheet gives you information about how to care for yourself after your test. Your doctor may also give you more specific instructions. If you have problems or questions, contact your doctor. Follow these instructions at home: Eating and drinking  The day after the test, go back to your normal diet. Driving  Do not drive for 24 hours if you were given a medicine to help you relax (sedative).  Do not drive or use heavy machinery while taking prescription pain medicine. General instructions   Take over-the-counter and prescription medicines only as told by your doctor.  Return to your normal activities as told. Ask what activities are safe for you.  Do not use any products that have nicotine or tobacco in them. This includes cigarettes and e-cigarettes. If you need help quitting, ask your doctor.  Keep all follow-up visits as told by your doctor. This is important. It is very important if you had a tissue sample (biopsy) taken. Get help right away if:  You have shortness of breath that gets worse.  You get light-headed.  You feel like you are going to pass out (faint).  You have chest pain.  You cough up: ? More than a little blood. ? More blood than before. Summary  Do not eat or drink anything (not even water) for 2 hours after your test, or until your numbing medicine wears off.  Do not use cigarettes. Do not use e-cigarettes.  Get help right away if you have chest pain. This information is not intended to replace advice given to you by your health care provider. Make sure you discuss any questions you have with your health care provider. Document Revised: 11/09/2017 Document Reviewed: 12/15/2016 Elsevier Patient Education  2020 [employer].

## 2020-12-14 NOTE — Transfer of Care (Signed)
Immediate Anesthesia Transfer of Care Note  Patient: Daniel Richard  Procedure(s) Performed: VIDEO BRONCHOSCOPY WITH ENDOBRONCHIAL ULTRASOUND (N/A ) BRONCHIAL NEEDLE ASPIRATION BIOPSIES BRONCHIAL BIOPSIES  Patient Location: Endoscopy Unit  Anesthesia Type:General  Level of Consciousness: awake, alert  and oriented  Airway & Oxygen Therapy: Patient Spontanous Breathing and Patient connected to face mask oxygen  Post-op Assessment: Report given to RN and Post -op Vital signs reviewed and stable  Post vital signs: Reviewed and stable  Last Vitals:  Vitals Value Taken Time  BP 143/61 12/14/20 1420  Temp    Pulse 114 12/14/20 1421  Resp 22 12/14/20 1421  SpO2 96 % 12/14/20 1421  Vitals shown include unvalidated device data.  Last Pain:  Vitals:   12/14/20 1136  TempSrc:   PainSc: 0-No pain      Patients Stated Pain Goal: 3 (32/20/25 4270)  Complications: No complications documented.

## 2020-12-14 NOTE — Op Note (Signed)
Video Bronchoscopy with Endobronchial Ultrasound Procedure Note  Date of Operation: 12/14/2020  Pre-op Diagnosis: Lung mass, mediastinal adenopathy  Post-op Diagnosis: Lung mass, mediastinal adenopathy  Surgeon: Garner Nash, DO   Assistants: None   Anesthesia: General endotracheal anesthesia  Operation: Flexible video fiberoptic bronchoscopy with endobronchial ultrasound and biopsies.  Estimated Blood Loss: Minimal, <0UR   Complications: None   Indications and History: Daniel Richard is a 65 y.o. male with right upper lobe lung mass, mediastinal adenopathy.  The risks, benefits, complications, treatment options and expected outcomes were discussed with the patient.  The possibilities of pneumothorax, pneumonia, reaction to medication, pulmonary aspiration, perforation of a viscus, bleeding, failure to diagnose a condition and creating a complication requiring transfusion or operation were discussed with the patient who freely signed the consent.    Description of Procedure: The patient was examined in the preoperative area and history and data from the preprocedure consultation were reviewed. It was deemed appropriate to proceed.  The patient was taken to [facility] endoscopy room 2, identified as Daniel Richard and the procedure verified as Flexible Video Fiberoptic Bronchoscopy.  A Time Out was held and the above information confirmed. After being taken to the operating room general anesthesia was initiated and the patient  was orally intubated. The video fiberoptic bronchoscope was introduced via the endotracheal tube and a general inspection was performed which showed a normal right and left lung anatomy with no evidence of endobronchial lesion. The standard scope was then withdrawn and the endobronchial ultrasound was used to identify and characterize the peritracheal, hilar and bronchial lymph nodes. Inspection showed 1.5 cm station 7 subcarinal node, a enlarged, largest cross-section  approximately 2.5 cm 4R pretracheal node. Using real-time ultrasound guidance Wang needle biopsies were take from Station 7 & 4R nodes and were sent for cytology. The patient tolerated the procedure well without apparent complications.  Following needle aspiration of station as described above we used a standard therapeutic bronchoscope to insert into the airway.  Blood clots within the right tracheal wall and medial aspect of the right therapeutic aspiration of the bilateral mainstem's were completed and clearance of any remaining blood clots and secretions.  Attention was turned to the right upper lobe posterior subsegment which had a small amount of blood coming from this orifice and irregular appearing mucosa distally.  [employer] forceps were used for transbronchial biopsies to the right upper lobe posterior segment.  These were collected for surgical pathology.  We also used a cytology brush specimen from this same right upper lobe posterior segment.  A BAL was also completed to be sent for cytology from the right upper lobe posterior segment with moderate saline return. There was no significant blood loss. The bronchoscope was withdrawn. Anesthesia was reversed and the patient was taken to the PACU for recovery.   Samples: 1. Wang needle biopsies from station 7 node 2. Wang needle biopsies from station 4R node 3. RUL posterior segment transbronchial biopsies, brushings and BAL  Plans:  The patient will be discharged from the PACU to home when recovered from anesthesia. We will review the cytology, pathology and microbiology results with the patient when they become available. Outpatient followup will be with Garner Nash, DO.   Garner Nash, DO [facility] 12/14/2020 2:33 PM

## 2020-12-14 NOTE — Progress Notes (Addendum)
St jude rep paged  Per Dr Kerin Perna, patient does not need ICD interrogation before the procedure today.   Jarrett Soho from [facility] rep has interrogated ICD device, rep stated she will make sure the report will be sent over to the office

## 2020-12-14 NOTE — Anesthesia Postprocedure Evaluation (Signed)
Anesthesia Post Note  Patient: Mikolaj Woolstenhulme [place]  Procedure(s) Performed: VIDEO BRONCHOSCOPY WITH ENDOBRONCHIAL ULTRASOUND (N/A ) BRONCHIAL NEEDLE ASPIRATION BIOPSIES BRONCHIAL BIOPSIES     Patient location during evaluation: PACU Anesthesia Type: General Level of consciousness: awake and alert Pain management: pain level controlled Vital Signs Assessment: post-procedure vital signs reviewed and stable Respiratory status: spontaneous breathing, nonlabored ventilation and respiratory function stable Cardiovascular status: blood pressure returned to baseline and stable Postop Assessment: no apparent nausea or vomiting Anesthetic complications: no   No complications documented.  Last Vitals:  Vitals:   12/14/20 1500 12/14/20 1510  BP: (!) 119/57 (!) 120/59  Pulse: (!) 118 (!) 121  Resp: (!) 24 19  Temp:    SpO2: 92% 93%    Last Pain:  Vitals:   12/14/20 1510  TempSrc:   PainSc: 0-No pain                 Lidia Collum

## 2020-12-14 NOTE — Interval H&P Note (Signed)
History and Physical Interval Note:  12/14/2020 11:33 AM  Daniel Richard  has presented today for surgery, with the diagnosis of LUNG MASS ,ADENOPATHY.  The various methods of treatment have been discussed with the patient and family. After consideration of risks, benefits and other options for treatment, the patient has consented to  Procedure(s): [place] (N/A) as a surgical intervention.  The patient's history has been reviewed, patient examined, no change in status, stable for surgery.  I have reviewed the patient's chart and labs.  Questions were answered to the patient's satisfaction.    Discussed risks and benefits of procedure. The patient is agreeable to proceed.   [place]

## 2020-12-15 ENCOUNTER — Telehealth: Payer: Self-pay | Admitting: Pulmonary Disease

## 2020-12-15 NOTE — Telephone Encounter (Signed)
Spoke with the pt  He is asking when to start back on his Eliquis  He had bronch done 12/14/20  He also wanted to let Dr Valeta Harms know how much he appreciates his care, and that he is doing well today  Please advise on Eliquis, thanks!

## 2020-12-15 NOTE — Telephone Encounter (Signed)
He can restart now Garner Nash, DO [place] 12/15/2020 4:33 PM

## 2020-12-15 NOTE — Telephone Encounter (Signed)
Spoke with the pt and notified of response per Dr. Valeta Harms  Pt verbalized understanding and nothing further needed

## 2020-12-17 ENCOUNTER — Telehealth: Payer: Self-pay | Admitting: Internal Medicine

## 2020-12-17 ENCOUNTER — Telehealth: Payer: Self-pay | Admitting: *Deleted

## 2020-12-17 ENCOUNTER — Inpatient Hospital Stay: Payer: BC Managed Care – PPO

## 2020-12-17 ENCOUNTER — Encounter: Payer: Self-pay | Admitting: *Deleted

## 2020-12-17 ENCOUNTER — Inpatient Hospital Stay: Payer: BC Managed Care – PPO | Attending: Nurse Practitioner | Admitting: Nurse Practitioner

## 2020-12-17 DIAGNOSIS — I2510 Atherosclerotic heart disease of native coronary artery without angina pectoris: Secondary | ICD-10-CM | POA: Insufficient documentation

## 2020-12-17 DIAGNOSIS — R0600 Dyspnea, unspecified: Secondary | ICD-10-CM | POA: Insufficient documentation

## 2020-12-17 DIAGNOSIS — R52 Pain, unspecified: Secondary | ICD-10-CM | POA: Insufficient documentation

## 2020-12-17 DIAGNOSIS — Z7901 Long term (current) use of anticoagulants: Secondary | ICD-10-CM | POA: Insufficient documentation

## 2020-12-17 DIAGNOSIS — C227 Other specified carcinomas of liver: Secondary | ICD-10-CM | POA: Insufficient documentation

## 2020-12-17 DIAGNOSIS — Z7289 Other problems related to lifestyle: Secondary | ICD-10-CM | POA: Insufficient documentation

## 2020-12-17 DIAGNOSIS — Z87891 Personal history of nicotine dependence: Secondary | ICD-10-CM | POA: Insufficient documentation

## 2020-12-17 DIAGNOSIS — R634 Abnormal weight loss: Secondary | ICD-10-CM | POA: Insufficient documentation

## 2020-12-17 DIAGNOSIS — C778 Secondary and unspecified malignant neoplasm of lymph nodes of multiple regions: Secondary | ICD-10-CM | POA: Insufficient documentation

## 2020-12-17 DIAGNOSIS — I4891 Unspecified atrial fibrillation: Secondary | ICD-10-CM | POA: Insufficient documentation

## 2020-12-17 DIAGNOSIS — R630 Anorexia: Secondary | ICD-10-CM | POA: Insufficient documentation

## 2020-12-17 DIAGNOSIS — Z79899 Other long term (current) drug therapy: Secondary | ICD-10-CM | POA: Insufficient documentation

## 2020-12-17 DIAGNOSIS — J449 Chronic obstructive pulmonary disease, unspecified: Secondary | ICD-10-CM | POA: Insufficient documentation

## 2020-12-17 DIAGNOSIS — R609 Edema, unspecified: Secondary | ICD-10-CM | POA: Insufficient documentation

## 2020-12-17 LAB — CYTOLOGY - NON PAP

## 2020-12-17 LAB — SURGICAL PATHOLOGY

## 2020-12-17 NOTE — Telephone Encounter (Signed)
I received a message from Dr. Benay Spice nurse, Manuela Schwartz.  She updated me that patient was 30 minutes late to his appt today.  I called [name].  Patient states he was unaware of his appt.  I updated Manuela Schwartz.  I will update new patient coordinator to call him to re-schedule.  I updated patient.  He apologized for not making his appt but did not remember anyone updating him.  I explained that I apologize for any miscommunication and that we will get him scheduled again.

## 2020-12-17 NOTE — Progress Notes (Signed)
I followed up on Mr. Stehr's bx.  Results are pending. I reached out to pathology dept for an update due to patient is being seen by Ned Card NP today.  Wait for response.

## 2020-12-17 NOTE — Telephone Encounter (Signed)
New message   Received message from device clinic RN, to call and get pt scheduled for an appt with SK. Over due since 07.2021. Pt states he was just recently diagnosed with lung and liver cancer. He is waiting to hear about his treatment options. He said while he was in the hospital someone came and checked his PPM. He said he is unable to make an appt at this time, but would like Dr. Aquilla Hacker opinion because he doesn't know what is going on. He asked about making changes to his device to get more air and he states that he is always in afib.

## 2020-12-20 ENCOUNTER — Institutional Professional Consult (permissible substitution): Payer: BC Managed Care – PPO | Admitting: Pulmonary Disease

## 2020-12-20 NOTE — Progress Notes (Signed)
I spoke with patient's wife Daniel Richard regarding him missing his appointment last Friday.  I have rescheduled him for this Friday 12/29/2020 at 1:45 to arrive by 1:30.  She verbalized an understanding.

## 2020-12-24 ENCOUNTER — Other Ambulatory Visit: Payer: Self-pay | Admitting: *Deleted

## 2020-12-24 ENCOUNTER — Telehealth: Payer: Self-pay | Admitting: Oncology

## 2020-12-24 ENCOUNTER — Inpatient Hospital Stay: Payer: BC Managed Care – PPO | Admitting: Nurse Practitioner

## 2020-12-24 ENCOUNTER — Inpatient Hospital Stay: Payer: BC Managed Care – PPO

## 2020-12-24 ENCOUNTER — Encounter: Payer: Self-pay | Admitting: Nurse Practitioner

## 2020-12-24 ENCOUNTER — Other Ambulatory Visit: Payer: Self-pay

## 2020-12-24 ENCOUNTER — Telehealth: Payer: Self-pay

## 2020-12-24 VITALS — BP 119/63 | HR 81 | Temp 97.6°F | Resp 17 | Ht 72.0 in | Wt 174.8 lb

## 2020-12-24 DIAGNOSIS — Z7289 Other problems related to lifestyle: Secondary | ICD-10-CM | POA: Diagnosis not present

## 2020-12-24 DIAGNOSIS — I4891 Unspecified atrial fibrillation: Secondary | ICD-10-CM | POA: Diagnosis not present

## 2020-12-24 DIAGNOSIS — Z79899 Other long term (current) drug therapy: Secondary | ICD-10-CM | POA: Diagnosis not present

## 2020-12-24 DIAGNOSIS — C220 Liver cell carcinoma: Secondary | ICD-10-CM

## 2020-12-24 DIAGNOSIS — R52 Pain, unspecified: Secondary | ICD-10-CM | POA: Diagnosis not present

## 2020-12-24 DIAGNOSIS — J449 Chronic obstructive pulmonary disease, unspecified: Secondary | ICD-10-CM | POA: Diagnosis not present

## 2020-12-24 DIAGNOSIS — R634 Abnormal weight loss: Secondary | ICD-10-CM | POA: Diagnosis not present

## 2020-12-24 DIAGNOSIS — I2510 Atherosclerotic heart disease of native coronary artery without angina pectoris: Secondary | ICD-10-CM | POA: Diagnosis not present

## 2020-12-24 DIAGNOSIS — C778 Secondary and unspecified malignant neoplasm of lymph nodes of multiple regions: Secondary | ICD-10-CM | POA: Diagnosis not present

## 2020-12-24 DIAGNOSIS — D649 Anemia, unspecified: Secondary | ICD-10-CM

## 2020-12-24 DIAGNOSIS — Z7901 Long term (current) use of anticoagulants: Secondary | ICD-10-CM | POA: Diagnosis not present

## 2020-12-24 DIAGNOSIS — Z87891 Personal history of nicotine dependence: Secondary | ICD-10-CM | POA: Diagnosis not present

## 2020-12-24 DIAGNOSIS — R609 Edema, unspecified: Secondary | ICD-10-CM | POA: Diagnosis not present

## 2020-12-24 DIAGNOSIS — R0600 Dyspnea, unspecified: Secondary | ICD-10-CM | POA: Diagnosis not present

## 2020-12-24 DIAGNOSIS — R918 Other nonspecific abnormal finding of lung field: Secondary | ICD-10-CM

## 2020-12-24 DIAGNOSIS — C227 Other specified carcinomas of liver: Secondary | ICD-10-CM | POA: Diagnosis not present

## 2020-12-24 DIAGNOSIS — R630 Anorexia: Secondary | ICD-10-CM | POA: Diagnosis not present

## 2020-12-24 LAB — CBC WITH DIFFERENTIAL (CANCER CENTER ONLY)
Abs Immature Granulocytes: 0.78 10*3/uL — ABNORMAL HIGH (ref 0.00–0.07)
Basophils Absolute: 0.1 10*3/uL (ref 0.0–0.1)
Basophils Relative: 0 %
Eosinophils Absolute: 0 10*3/uL (ref 0.0–0.5)
Eosinophils Relative: 0 %
HCT: 20.7 % — ABNORMAL LOW (ref 39.0–52.0)
Hemoglobin: 6.3 g/dL — CL (ref 13.0–17.0)
Immature Granulocytes: 3 %
Lymphocytes Relative: 9 %
Lymphs Abs: 2.1 10*3/uL (ref 0.7–4.0)
MCH: 26.5 pg (ref 26.0–34.0)
MCHC: 30.4 g/dL (ref 30.0–36.0)
MCV: 87 fL (ref 80.0–100.0)
Monocytes Absolute: 2.6 10*3/uL — ABNORMAL HIGH (ref 0.1–1.0)
Monocytes Relative: 11 %
Neutro Abs: 18 10*3/uL — ABNORMAL HIGH (ref 1.7–7.7)
Neutrophils Relative %: 77 %
Platelet Count: 472 10*3/uL — ABNORMAL HIGH (ref 150–400)
RBC: 2.38 MIL/uL — ABNORMAL LOW (ref 4.22–5.81)
RDW: 18.1 % — ABNORMAL HIGH (ref 11.5–15.5)
WBC Count: 23.6 10*3/uL — ABNORMAL HIGH (ref 4.0–10.5)
nRBC: 0.1 % (ref 0.0–0.2)

## 2020-12-24 LAB — CMP (CANCER CENTER ONLY)
ALT: 39 U/L (ref 0–44)
AST: 56 U/L — ABNORMAL HIGH (ref 15–41)
Albumin: 2 g/dL — ABNORMAL LOW (ref 3.5–5.0)
Alkaline Phosphatase: 337 U/L — ABNORMAL HIGH (ref 38–126)
Anion gap: 10 (ref 5–15)
BUN: 8 mg/dL (ref 8–23)
CO2: 25 mmol/L (ref 22–32)
Calcium: 8.9 mg/dL (ref 8.9–10.3)
Chloride: 93 mmol/L — ABNORMAL LOW (ref 98–111)
Creatinine: 0.67 mg/dL (ref 0.61–1.24)
GFR, Estimated: >60 mL/min (ref >60)
Glucose, Bld: 114 mg/dL — ABNORMAL HIGH (ref 70–99)
Potassium: 4.4 mmol/L (ref 3.5–5.1)
Sodium: 128 mmol/L — ABNORMAL LOW (ref 135–145)
Total Bilirubin: 1 mg/dL (ref 0.3–1.2)
Total Protein: 6.7 g/dL (ref 6.5–8.1)

## 2020-12-24 LAB — PREPARE RBC (CROSSMATCH)

## 2020-12-24 LAB — ABO/RH: ABO/RH(D): O POS

## 2020-12-24 MED ORDER — PROCHLORPERAZINE MALEATE 5 MG PO TABS: 5.0000 mg | ORAL_TABLET | Freq: Four times a day (QID) | ORAL | 0 refills | Status: AC | PRN

## 2020-12-24 NOTE — Progress Notes (Signed)
CRITICAL VALUE STICKER  CRITICAL VALUE: HGB: 6.3  RECEIVER (on-site recipient of call): Yetta Glassman  DATE & TIME NOTIFIED: 12/24/20 3:30 pm  MESSENGER (representative from lab): Pam  MD NOTIFIED: Ned Card NP  TIME OF NOTIFICATION: 3:45 pm  RESPONSE: arranging trasfusion

## 2020-12-24 NOTE — Progress Notes (Signed)
Provided patient with script for type and screen for 2 units tomorrow--approved by blood bank. Will need 2 tubes collected 2-3 minutes apart (1st transfusion). Took patient to [facility] lab via w/c and instructed him and wife to keep blue bracelet on and to come to [facility] for transfusion. Ned Card, NP discussed transfusion with him.

## 2020-12-24 NOTE — Progress Notes (Signed)
Dr. Benay Spice is requesting PD-L1 testing on lung biopsy. Case #[id number] dated 12/14/20.  Email to [facility] pathology with request

## 2020-12-24 NOTE — Telephone Encounter (Signed)
Scheduled appointments per 1/14 los. Spoke to patient who is aware of appointments date and times. Gave patient calendar print out.

## 2020-12-24 NOTE — Addendum Note (Signed)
Addended by: Owens Shark on: 12/24/2020 04:50 PM   Modules accepted: Orders

## 2020-12-24 NOTE — Telephone Encounter (Signed)
CRITICAL VALUE STICKER  CRITICAL VALUE: Hgb = 6.3  RECEIVER (on-site recipient of call): Yetta Glassman, [place] NOTIFIED: 12/24/20 at 3:27pm  MESSENGER (representative from lab): Pam  MD NOTIFIED: Ned Card, NP  [place]: 12/24/20 at 3:30pm  RESPONSE: Notification given to Ilda Foil, RN for follow-up with provider.

## 2020-12-24 NOTE — Progress Notes (Addendum)
[place] OFFICE PROGRESS NOTE   Diagnosis: Poorly differentiated carcinoma involving a liver lesion and mediastinal lymph node  INTERVAL HISTORY:   Daniel Richard returns for follow-up.  He continues to have significant shortness of breath.  He utilizes supplemental oxygen and notes temporary relief with nebulizer treatments.  He has a cough that "comes and goes".  No fever.  He feels weak.  He has developed leg swelling.  The swelling improves with elevation.  He is having intermittent nausea.  He has right-sided abdominal pain.  He is taking 1 hydrocodone every 6 hours with partial relief.  Objective:  Vital signs in last 24 hours:  Blood pressure 119/63, pulse 81, temperature 97.6 F (36.4 C), temperature source Tympanic, resp. rate 17, height 6' (1.829 m), weight 174 lb 12.8 oz (79.3 kg), SpO2 97 %.    Resp: Breath sounds diminished right lower lung field. Cardio: Irregular. GI: Liver palpable right upper abdomen, associated tenderness. Vascular: Pitting edema at the lower leg/ankle bilaterally. Neuro: Alert and oriented.   Lab Results:  Lab Results  Component Value Date   WBC 26.0 (H) 12/14/2020   HGB 8.6 (L) 12/14/2020   HCT 29.4 (L) 12/14/2020   MCV 90.2 12/14/2020   PLT 377 12/14/2020   NEUTROABS 8.5 (H) 10/13/2020    Imaging:  No results found.  Medications: I have reviewed the patient's current medications.  Assessment/Plan: 1. Poorly differentiated carcinoma status post biopsy of a liver lesion   Abdominal ultrasound 11/04/2020-contour of the liver irregular and mildly nodular;large, solid appearing mass with heterogeneous echotexture involving the right hepatic lobe measuring 13.6 x 11.6 x 12.7 cm.    CTs chest/abdomen/pelvis 11/04/2020-large 12.7 x 11.6 x 12.4 cm irregular multilobular mass lesion versus collection of clustered smaller lesions in the upper liver; 2.8 cm subcapsular lesion noted lateral right liver; probable tiny 8 mm  lesion in the inferior right liver; no splenomegaly; 5.9 x 5.1 x 5.3 cm posterior right upper lobe pulmonary mass contiguous with the posterior aspect of the major fissure; 14 mm right paratracheal node; precarinal lymphadenopathy measuring up to 2.1 cm; 13 mm right hilar node; centrilobular and paraseptal emphysema.    Biopsy of right liver mass 11/19/2020-poorly differentiated carcinoma with immunoprofile most consistent with a poorly differentiated hepatocellular carcinoma  Bronchoscopy with endobronchial ultrasound and biopsies 12/14/2020- right upper lobe biopsy, benign lung tissue; right upper lobe brushing, no malignant cells identified; right upper lung lavage, no malignant cells identified; 4R lymph node, malignant cells consistent with poorly differentiated carcinoma (positive MOC31, Hepar-1, cytokeratin 7.  Negative for glypican-3, TTF-1, Napsin A, cytokeratin 5/6 and p40); morphology and immunohistochemistry similar to liver biopsy. 2. Atrial fibrillation 3. CAD/MI 4. COPD  5. History of tobacco and alcohol use 6. Anorexia/weight loss   Disposition: Dr. Benay Spice reviewed the pathology report from the recent bronchoscopy/biopsy which showed poorly differentiated carcinoma a mediastinal lymph node, similar to the biopsy of the liver lesion.  It remains unclear if the primary is lung or liver.  He will return to the lab for an AFP.  We will request PD1 testing on the lung biopsy.  Dr. Benay Spice discussed systemic treatment versus supportive care with a hospice referral.  Daniel Richard and his wife understand that no therapy will be curative.  He is not ready to make a decision.  He would like to consider the information provided to him today and return for a follow-up visit in 1 week for additional discussion.  Prescription sent to his pharmacy  for Compazine 5 mg every 6 hours as needed.  He will continue hydrocodone as needed for pain.  He understands he can take 2 hydrocodone tablets if 1 is  not effective.  Patient seen with Dr. Benay Spice.    Ned Card ANP/GNP-BC   12/24/2020  2:36 PM This was a shared visit with Ned Card.  Daniel Richard was interviewed and examined.  Biopsy of a mediastinal lymph node confirmed metastatic carcinoma, similar to the histology from the liver biopsy.  The clinical presentation is most consistent with metastatic hepatocellular carcinoma.  He appears to have cirrhosis.  I discussed treatment options with Daniel Richard and his wife.  I recommend treatment with a  atezolizumab/bevacizumab.  We reviewed potential toxicities associated with this regimen.  He is undecided as to whether he will begin a trial of systemic therapy or follow a comfort care approach.  No therapy will be curative.  He will return for an office visit and further discussion next week.  His chief complaint today is dyspnea.  I felt the dyspnea was related to COPD and the tumor burden.  He returned to the lab for a CBC and the hemoglobin returned at 6.3.  This likely explains the dyspnea.  He could leave bleeding from the GI tract.  He will be scheduled for Red cell transfusion and return for follow-up labs next week.  We will make a GI referral to consider an endoscopy to assess for varices.  Julieanne Manson, MD

## 2020-12-25 ENCOUNTER — Inpatient Hospital Stay: Payer: BC Managed Care – PPO

## 2020-12-25 DIAGNOSIS — D649 Anemia, unspecified: Secondary | ICD-10-CM

## 2020-12-25 DIAGNOSIS — Z7289 Other problems related to lifestyle: Secondary | ICD-10-CM | POA: Diagnosis not present

## 2020-12-25 DIAGNOSIS — I2510 Atherosclerotic heart disease of native coronary artery without angina pectoris: Secondary | ICD-10-CM | POA: Diagnosis not present

## 2020-12-25 DIAGNOSIS — Z79899 Other long term (current) drug therapy: Secondary | ICD-10-CM | POA: Diagnosis not present

## 2020-12-25 DIAGNOSIS — R52 Pain, unspecified: Secondary | ICD-10-CM | POA: Diagnosis not present

## 2020-12-25 DIAGNOSIS — R609 Edema, unspecified: Secondary | ICD-10-CM | POA: Diagnosis not present

## 2020-12-25 DIAGNOSIS — J449 Chronic obstructive pulmonary disease, unspecified: Secondary | ICD-10-CM | POA: Diagnosis not present

## 2020-12-25 DIAGNOSIS — R0600 Dyspnea, unspecified: Secondary | ICD-10-CM | POA: Diagnosis not present

## 2020-12-25 DIAGNOSIS — R630 Anorexia: Secondary | ICD-10-CM | POA: Diagnosis not present

## 2020-12-25 DIAGNOSIS — Z7901 Long term (current) use of anticoagulants: Secondary | ICD-10-CM | POA: Diagnosis not present

## 2020-12-25 DIAGNOSIS — C227 Other specified carcinomas of liver: Secondary | ICD-10-CM | POA: Diagnosis not present

## 2020-12-25 DIAGNOSIS — Z87891 Personal history of nicotine dependence: Secondary | ICD-10-CM | POA: Diagnosis not present

## 2020-12-25 DIAGNOSIS — I4891 Unspecified atrial fibrillation: Secondary | ICD-10-CM | POA: Diagnosis not present

## 2020-12-25 DIAGNOSIS — C778 Secondary and unspecified malignant neoplasm of lymph nodes of multiple regions: Secondary | ICD-10-CM | POA: Diagnosis not present

## 2020-12-25 DIAGNOSIS — R634 Abnormal weight loss: Secondary | ICD-10-CM | POA: Diagnosis not present

## 2020-12-25 LAB — AFP TUMOR MARKER: AFP, Serum, Tumor Marker: 1.4 ng/mL (ref 0.0–8.3)

## 2020-12-25 MED ORDER — SODIUM CHLORIDE 0.9% IV SOLUTION
250.0000 mL | Freq: Once | INTRAVENOUS | Status: AC
  Administered 2020-12-25: 250 mL via INTRAVENOUS
  Filled 2020-12-25: qty 250

## 2020-12-25 NOTE — Patient Instructions (Signed)

## 2020-12-26 LAB — TYPE AND SCREEN
ABO/RH(D): O POS
Antibody Screen: NEGATIVE
Unit division: 0
Unit division: 0

## 2020-12-26 LAB — BPAM RBC
Blood Product Expiration Date: 202202162359
Blood Product Expiration Date: 202202172359
ISSUE DATE / TIME: 202201150833
ISSUE DATE / TIME: 202201150833
Unit Type and Rh: 5100
Unit Type and Rh: 5100

## 2020-12-28 ENCOUNTER — Encounter (HOSPITAL_COMMUNITY): Payer: Self-pay

## 2020-12-28 ENCOUNTER — Telehealth: Payer: Self-pay

## 2020-12-28 ENCOUNTER — Emergency Department (HOSPITAL_COMMUNITY)
Admission: EM | Admit: 2020-12-28 | Discharge: 2020-12-28 | Disposition: A | Discharge status: Home / Self Care | Payer: BC Managed Care – PPO | Attending: Emergency Medicine | Admitting: Emergency Medicine

## 2020-12-28 ENCOUNTER — Other Ambulatory Visit: Payer: Self-pay

## 2020-12-28 DIAGNOSIS — I10 Essential (primary) hypertension: Secondary | ICD-10-CM | POA: Insufficient documentation

## 2020-12-28 DIAGNOSIS — Z87891 Personal history of nicotine dependence: Secondary | ICD-10-CM | POA: Insufficient documentation

## 2020-12-28 DIAGNOSIS — Z7951 Long term (current) use of inhaled steroids: Secondary | ICD-10-CM | POA: Insufficient documentation

## 2020-12-28 DIAGNOSIS — K625 Hemorrhage of anus and rectum: Secondary | ICD-10-CM | POA: Diagnosis not present

## 2020-12-28 DIAGNOSIS — Z7901 Long term (current) use of anticoagulants: Secondary | ICD-10-CM | POA: Diagnosis not present

## 2020-12-28 DIAGNOSIS — I4891 Unspecified atrial fibrillation: Secondary | ICD-10-CM | POA: Diagnosis not present

## 2020-12-28 DIAGNOSIS — Z79899 Other long term (current) drug therapy: Secondary | ICD-10-CM | POA: Diagnosis not present

## 2020-12-28 DIAGNOSIS — K9289 Other specified diseases of the digestive system: Secondary | ICD-10-CM | POA: Insufficient documentation

## 2020-12-28 DIAGNOSIS — Z955 Presence of coronary angioplasty implant and graft: Secondary | ICD-10-CM | POA: Diagnosis not present

## 2020-12-28 DIAGNOSIS — C801 Malignant (primary) neoplasm, unspecified: Secondary | ICD-10-CM | POA: Diagnosis not present

## 2020-12-28 DIAGNOSIS — C229 Malignant neoplasm of liver, not specified as primary or secondary: Secondary | ICD-10-CM | POA: Insufficient documentation

## 2020-12-28 DIAGNOSIS — R609 Edema, unspecified: Secondary | ICD-10-CM | POA: Insufficient documentation

## 2020-12-28 DIAGNOSIS — C3490 Malignant neoplasm of unspecified part of unspecified bronchus or lung: Secondary | ICD-10-CM | POA: Insufficient documentation

## 2020-12-28 DIAGNOSIS — Z95810 Presence of automatic (implantable) cardiac defibrillator: Secondary | ICD-10-CM | POA: Insufficient documentation

## 2020-12-28 DIAGNOSIS — R0902 Hypoxemia: Secondary | ICD-10-CM | POA: Diagnosis not present

## 2020-12-28 DIAGNOSIS — E871 Hypo-osmolality and hyponatremia: Secondary | ICD-10-CM

## 2020-12-28 DIAGNOSIS — R1011 Right upper quadrant pain: Secondary | ICD-10-CM | POA: Insufficient documentation

## 2020-12-28 DIAGNOSIS — R58 Hemorrhage, not elsewhere classified: Secondary | ICD-10-CM | POA: Diagnosis not present

## 2020-12-28 DIAGNOSIS — I25110 Atherosclerotic heart disease of native coronary artery with unstable angina pectoris: Secondary | ICD-10-CM | POA: Diagnosis not present

## 2020-12-28 DIAGNOSIS — R160 Hepatomegaly, not elsewhere classified: Secondary | ICD-10-CM

## 2020-12-28 LAB — CBC WITH DIFFERENTIAL/PLATELET
Abs Immature Granulocytes: 0.34 10*3/uL — ABNORMAL HIGH (ref 0.00–0.07)
Basophils Absolute: 0 10*3/uL (ref 0.0–0.1)
Basophils Relative: 0 %
Eosinophils Absolute: 0 10*3/uL (ref 0.0–0.5)
Eosinophils Relative: 0 %
HCT: 25.1 % — ABNORMAL LOW (ref 39.0–52.0)
Hemoglobin: 7.9 g/dL — ABNORMAL LOW (ref 13.0–17.0)
Immature Granulocytes: 1 %
Lymphocytes Relative: 6 %
Lymphs Abs: 1.5 10*3/uL (ref 0.7–4.0)
MCH: 27.7 pg (ref 26.0–34.0)
MCHC: 31.5 g/dL (ref 30.0–36.0)
MCV: 88.1 fL (ref 80.0–100.0)
Monocytes Absolute: 1.8 10*3/uL — ABNORMAL HIGH (ref 0.1–1.0)
Monocytes Relative: 8 %
Neutro Abs: 19.8 10*3/uL — ABNORMAL HIGH (ref 1.7–7.7)
Neutrophils Relative %: 85 %
Platelets: 284 10*3/uL (ref 150–400)
RBC: 2.85 MIL/uL — ABNORMAL LOW (ref 4.22–5.81)
RDW: 17.4 % — ABNORMAL HIGH (ref 11.5–15.5)
WBC: 23.5 10*3/uL — ABNORMAL HIGH (ref 4.0–10.5)
nRBC: 0.2 % (ref 0.0–0.2)

## 2020-12-28 LAB — COMPREHENSIVE METABOLIC PANEL
ALT: 32 U/L (ref 0–44)
AST: 38 U/L (ref 15–41)
Albumin: 2.1 g/dL — ABNORMAL LOW (ref 3.5–5.0)
Alkaline Phosphatase: 284 U/L — ABNORMAL HIGH (ref 38–126)
Anion gap: 11 (ref 5–15)
BUN: 11 mg/dL (ref 8–23)
CO2: 23 mmol/L (ref 22–32)
Calcium: 8.1 mg/dL — ABNORMAL LOW (ref 8.9–10.3)
Chloride: 92 mmol/L — ABNORMAL LOW (ref 98–111)
Creatinine, Ser: 0.58 mg/dL — ABNORMAL LOW (ref 0.61–1.24)
GFR, Estimated: >60 mL/min (ref >60)
Glucose, Bld: 108 mg/dL — ABNORMAL HIGH (ref 70–99)
Potassium: 4.2 mmol/L (ref 3.5–5.1)
Sodium: 126 mmol/L — ABNORMAL LOW (ref 135–145)
Total Bilirubin: 1.5 mg/dL — ABNORMAL HIGH (ref 0.3–1.2)
Total Protein: 6.5 g/dL (ref 6.5–8.1)

## 2020-12-28 LAB — POC OCCULT BLOOD, ED: Fecal Occult Bld: POSITIVE — AB

## 2020-12-28 LAB — PROTIME-INR
INR: 1.9 — ABNORMAL HIGH (ref 0.8–1.2)
Prothrombin Time: 21.2 seconds — ABNORMAL HIGH (ref 11.4–15.2)

## 2020-12-28 LAB — TYPE AND SCREEN
ABO/RH(D): O POS
Antibody Screen: NEGATIVE

## 2020-12-28 NOTE — Telephone Encounter (Signed)
Left message on machine to call back  

## 2020-12-28 NOTE — ED Triage Notes (Signed)
Pt BIB EMS  Per EMS, pt went to use the bathroom and noticed a lot of bright red blood in bowel movement, currently on Eliquis for Hx of AFIB, recently Dx with cancer to right lung and liver. Recently took stool softener to have a bowel movement. Pt reports SOB with normal activity, placed on 2L via [place].   Vitals  102/70 90 HR  16 R 94 2L via [place]    20 Gauge to left hand   Has not started chemo or radiation; Supposed to start on the 21st of January.

## 2020-12-28 NOTE — Telephone Encounter (Signed)
Patient's daughter Daniel Richard calls to let us know that patient had blood transfusion Saturday morning, did okay over the weekend then yesterday started with headache, more SOB.  Today he had a BM and patient told his wife that there were large amounts of frank blood int he toilet.  The wife is planning to call 911 to have him evaluated at hospital.  I tried to call her right back and received her voice message.  I recommended he proceed to ED.  Dr. Benay Spice has been made aware.

## 2020-12-28 NOTE — ED Notes (Signed)
POC Occult POS given to MD and RN

## 2020-12-28 NOTE — ED Provider Notes (Signed)
[facility] DEPT Provider Note   CSN: [id number] Arrival date & time: 12/28/20  1628     History Chief Complaint  Patient presents with  . Lower GI Bleed    Daniel Richard is a 65 y.o. male.  Patient has a history of A. fib and is on Eliquis.  He is also newly diagnosed with liver and lung cancer.  He said he has been constipated for the last few days and took a stool softener.  He felt like he needed to move his bowels today so he had to push out hard stool.  He noticed some blood.  Did not fill the bowl with blood.  One episode.  He is concerned because he needed a blood transfusion recently for a low hemoglobin.  Has not seen any other bleeding.  Shortness of breath is at baseline.  Uses home oxygen.  The history is provided by the patient and the EMS personnel.  Rectal Bleeding Quality:  Bright red Amount:  Moderate Timing: once. Chronicity:  New Context: constipation, defecation and hemorrhoids   Relieved by:  None tried Worsened by:  Defecation Ineffective treatments:  None tried Associated symptoms: abdominal pain (chronic - no change)   Associated symptoms: no fever, no hematemesis, no loss of consciousness and no vomiting   Risk factors: anticoagulant use and liver disease        Past Medical History:  Diagnosis Date  . AICD (automatic cardioverter/defibrillator) present    ST JUDE  . ALLERGIC RHINITIS   . ANXIETY   . Arthritis    IN FINGERS  . Cardiac arrest ([place]) 11/21/2015  . Cardiac device in situ   . Coronary artery disease   . HYPERLIPIDEMIA   . HYPERTENSION   . LOW BACK PAIN   . Myocardial infarction ([place]) 11/21/2015  . Paroxysmal atrial fibrillation [facility])     Patient Active Problem List   Diagnosis Date Noted  . S/P bronchoscopy with biopsy   . Aortic atherosclerosis ([place]) 11/09/2020  . Pulmonary mass 11/09/2020  . Liver mass 11/09/2020  . Atrial fibrillation with rapid ventricular response ([place]) 10/13/2020   . LLQ pain 12/17/2019  . Insomnia 10/11/2019  . Left inguinal hernia 11/28/2016  . Hyperlipidemia LDL goal <70 11/30/2015  . Cardiac device in situ   . Sinus tachycardia   . Intercostal pain   . Coronary artery disease involving native coronary artery of native heart with angina pectoris ([place])   . Paroxysmal atrial fibrillation ([facility])   . Cardiac arrest ([place]) 11/21/2015  . Headache(784.0) 03/29/2014  . Occipital neuralgia of left side 03/25/2014  . Unspecified sinusitis (chronic) 03/25/2014  . Mouth pain 01/30/2014  . Myalgia 08/29/2013  . Encounter for well adult exam with abnormal findings 02/21/2013  . Anxiety state 07/16/2007  . Essential hypertension, benign 07/16/2007  . ALLERGIC RHINITIS 07/16/2007  . LOW BACK PAIN 07/16/2007    Past Surgical History:  Procedure Laterality Date  . BRONCHIAL BIOPSY  12/14/2020   Procedure: BRONCHIAL BIOPSIES;  Surgeon: Garner Nash, DO;  Location: [facility];  Service: Pulmonary;;  . BRONCHIAL NEEDLE ASPIRATION BIOPSY  12/14/2020   Procedure: BRONCHIAL NEEDLE ASPIRATION BIOPSIES;  Surgeon: Garner Nash, DO;  Location: [facility];  Service: Pulmonary;;  . CARDIAC CATHETERIZATION N/A 11/21/2015   Procedure: Left Heart Cath and Coronary Angiography;  Surgeon: Wellington Hampshire, MD;  Location: [facility] CV [facility];  Service: Cardiovascular;  Laterality: N/A;  . EP IMPLANTABLE DEVICE N/A 11/29/2015   Procedure:  ICD Implant;  Surgeon: Deboraha Sprang, MD;  Location: [place] CV LAB;  Service: Cardiovascular;  Laterality: N/A;  . INGUINAL HERNIA REPAIR Left 02/09/2017   Procedure: OPEN REPAIR LEFT INGUINAL HERNIA ;  Surgeon: Greer Pickerel, MD;  Location: [facility];  Service: General;  Laterality: Left;  . INSERTION OF MESH Left 02/09/2017   Procedure: INSERTION OF MESH;  Surgeon: Greer Pickerel, MD;  Location: [facility];  Service: General;  Laterality: Left;  Mar[place] Kitchen VIDEO BRONCHOSCOPY WITH ENDOBRONCHIAL ULTRASOUND N/A 12/14/2020   Procedure: VIDEO BRONCHOSCOPY  WITH ENDOBRONCHIAL ULTRASOUND;  Surgeon: Garner Nash, DO;  Location: [facility];  Service: Pulmonary;  Laterality: N/A;       Family History  Problem Relation Age of Onset  . Hypertension Other     Social History   Tobacco Use  . Smoking status: Former Smoker    Packs/day: 1.00    Years: 20.00    Pack years: 20.00    Types: Cigarettes    Quit date: 12/12/2007    Years since quitting: 13.0  . Smokeless tobacco: Never Used  Substance Use Topics  . Alcohol use: Yes    Comment: occ  . Drug use: No    Home Medications Prior to Admission medications   Medication Sig Start Date End Date Taking? Authorizing Provider  albuterol (PROVENTIL) (2.5 MG/3ML) 0.083% nebulizer solution Take 3 mLs (2.5 mg total) by nebulization every 6 (six) hours as needed for wheezing or shortness of breath. 12/09/20   Icard, Octavio Graves, DO  albuterol (VENTOLIN HFA) 108 (90 Base) MCG/ACT inhaler Inhale 2 puffs into the lungs every 6 (six) hours as needed for wheezing or shortness of breath. 11/29/20   Owens Shark, NP  carvedilol (COREG) 25 MG tablet TAKE 1/2 TABLET BY MOUTH TWICE A DAY WITH MEAL 12/13/20   Biagio Borg, MD  cholecalciferol (VITAMIN D3) 25 MCG (1000 UNIT) tablet Take 3,000 Units by mouth daily.    [provider]  diltiazem (CARDIZEM CD) 180 MG 24 hr capsule Take 1 capsule (180 mg total) by mouth daily. 10/13/20   Biagio Borg, MD  ELIQUIS 5 MG TABS tablet TAKE 1 TABLET BY MOUTH TWICE A DAY 12/13/20   Biagio Borg, MD  Fluticasone-Umeclidin-Vilant (TRELEGY ELLIPTA) 100-62.5-25 MCG/INH AEPB Inhale 1 puff into the lungs daily. 12/08/20   Icard, Octavio Graves, DO  Fluticasone-Umeclidin-Vilant (TRELEGY ELLIPTA) 100-62.5-25 MCG/INH AEPB Inhale 1 puff into the lungs daily. Patient not taking: Reported on 12/24/2020 12/08/20   Garner Nash, DO  HYDROcodone-acetaminophen (NORCO/VICODIN) 5-325 MG tablet Take 1 tablet by mouth every 6 (six) hours as needed for moderate pain. 12/13/20   Biagio Borg, MD  ibuprofen (ADVIL,MOTRIN) 200 MG tablet Take 200 mg by mouth every 6 (six) hours as needed (Pain).    [provider]  Multiple Vitamins-Minerals (MULTIVITAMIN WITH MINERALS) tablet Take 1 tablet by mouth daily.    [provider]  prochlorperazine (COMPAZINE) 5 MG tablet Take 1 tablet (5 mg total) by mouth every 6 (six) hours as needed for nausea or vomiting. 12/24/20   Owens Shark, NP  rosuvastatin (CRESTOR) 40 MG tablet TAKE 1 TABLET BY MOUTH EVERY DAY Patient taking differently: Take 40 mg by mouth daily. 10/18/20   Biagio Borg, MD    Allergies    Lipitor [atorvastatin] and Meperidine hcl  Review of Systems   Review of Systems  Constitutional: Negative for fever.  HENT: Negative for sore throat.   Eyes: Negative  for visual disturbance.  Respiratory: Positive for shortness of breath (chronic).   Cardiovascular: Negative for chest pain.  Gastrointestinal: Positive for abdominal pain (chronic - no change) and hematochezia. Negative for hematemesis and vomiting.  Genitourinary: Negative for dysuria.  Musculoskeletal: Negative for back pain.  Skin: Negative for rash.  Neurological: Negative for loss of consciousness and headaches.    Physical Exam Updated Vital Signs BP 108/85 (BP Location: Left Arm)   Pulse (!) 115   Temp 98.2 F (36.8 C) (Oral)   Resp (!) 28   Ht 6' (1.829 m)   Wt 77 kg   SpO2 95%   BMI 23.02 kg/m   Physical Exam Vitals and nursing note reviewed.  Constitutional:      Appearance: Normal appearance. He is well-developed and well-nourished.  HENT:     Head: Normocephalic and atraumatic.  Eyes:     Conjunctiva/sclera: Conjunctivae normal.  Cardiovascular:     Rate and Rhythm: Normal rate. Rhythm irregular.     Heart sounds: No murmur heard.   Pulmonary:     Effort: Pulmonary effort is normal. No respiratory distress.     Breath sounds: Normal breath sounds.  Abdominal:     Palpations: Abdomen is soft. There is mass  (ruq).     Tenderness: There is no abdominal tenderness.  Musculoskeletal:     Cervical back: Neck supple.     Right lower leg: Edema present.     Left lower leg: Edema present.  Skin:    General: Skin is warm and dry.  Neurological:     General: No focal deficit present.     Mental Status: He is alert.  Psychiatric:        Mood and Affect: Mood and affect normal.     ED Results / Procedures / Treatments   Labs (all labs ordered are listed, but only abnormal results are displayed) Labs Reviewed  CBC WITH DIFFERENTIAL/PLATELET - Abnormal; Notable for the following components:      Result Value   WBC 23.5 (*)    RBC 2.85 (*)    Hemoglobin 7.9 (*)    HCT 25.1 (*)    RDW 17.4 (*)    Neutro Abs 19.8 (*)    Monocytes Absolute 1.8 (*)    Abs Immature Granulocytes 0.34 (*)    All other components within normal limits  PROTIME-INR - Abnormal; Notable for the following components:   Prothrombin Time 21.2 (*)    INR 1.9 (*)    All other components within normal limits  COMPREHENSIVE METABOLIC PANEL - Abnormal; Notable for the following components:   Sodium 126 (*)    Chloride 92 (*)    Glucose, Bld 108 (*)    Creatinine, Ser 0.58 (*)    Calcium 8.1 (*)    Albumin 2.1 (*)    Alkaline Phosphatase 284 (*)    Total Bilirubin 1.5 (*)    All other components within normal limits  POC OCCULT BLOOD, ED - Abnormal; Notable for the following components:   Fecal Occult Bld POSITIVE (*)    All other components within normal limits  TYPE AND SCREEN    EKG EKG Interpretation  Date/Time:  Tuesday December 28 2020 16:55:47 EST Ventricular Rate:  114 PR Interval:    QRS Duration: 109 QT Interval:  334 QTC Calculation: 460 R Axis:   62 Text Interpretation: Atrial fibrillation nonspecific st/ts No significant change since prior 1/22 Confirmed by Aletta Edouard [phone]) on 12/28/2020 5:03:50 PM  Radiology No results found.  Procedures Procedures (including critical care  time)  Medications Ordered in ED Medications - No data to display  ED Course  I have reviewed the triage vital signs and the nursing notes.  Pertinent labs & imaging results that were available during my care of the patient were reviewed by me and considered in my medical decision making (see chart for details).  Clinical Course as of 12/29/20 1111  Tue Dec 28, 2020  1702 Rectal exam done with nurse as chaperone.  He has multiple tender nonbleeding hemorrhoids.  Hard stool in vault. [MB]  2047 Reviewed labs with Dr. Earlie Server. He wasn't too worried about the low sodium and felt that he could get closely follow-up with Dr. Benay Spice. [MB]  2048 Reviewed labs with patient. He is comfortable plan for outpatient follow-up with Dr. Benay Spice. Return instructions discussed. [MB]    Clinical Course User Index [MB] Hayden Rasmussen, MD   MDM Rules/Calculators/A&P                         This patient complains of rectal bleeding; this involves an extensive number of treatment Options and is a complaint that carries with it a high risk of complications and Morbidity. The differential includes lower GI bleed, hemorrhoids, constipation, anemia  I ordered, reviewed and interpreted labs, which included CBC with elevated white count, similar to priors, hemoglobin better than prior, chemistries with low sodium worsened from prior, normal BUN and creatinine, fecal occult positive  Additional history obtained from patient's wife Previous records obtained and reviewed in epic including prior oncology visits I consulted Dr. Julien Nordmann oncology and discussed lab and imaging findings  Critical Interventions: None  After the interventions stated above, I reevaluated the patient and found patient still to be tachycardic and tachypneic.  He said he feels at baseline.  He has had no further bleeding other than that one episode.  Likely related to constipation and hemorrhoids.  He is at increased risk of being on  blood thinner.  He is comfortable plan for close outpatient follow-up with oncology.  Return instructions discussed.   Final Clinical Impression(s) / ED Diagnoses Final diagnoses:  Rectal bleeding  Hyponatremia  Malignancy [facility])    Rx / DC Orders ED Discharge Orders    None       Hayden Rasmussen, MD 12/29/20 1113

## 2020-12-28 NOTE — Telephone Encounter (Signed)
Appt made for 12/31/20 at 950 am with Dr Ardis Hughs.  Labs have been entered to be completed a day prior.

## 2020-12-28 NOTE — Discharge Instructions (Addendum)
You were seen in the emergency department for evaluation of rectal bleeding. This is likely related to constipation and straining to move your bowels along with being on a blood thinner. You had tender hemorrhoids with some signs of bleeding. Fortunately your blood count isn't critically low right now. Your sodium was also low and this will need to be followed up with your oncologist. Return to the emergency department if any increased bleeding or other concerns.

## 2020-12-28 NOTE — Telephone Encounter (Signed)
-----   Message from Milus Banister, MD sent at 12/28/2020  6:32 AM EST ----- Got it, thanks  Raysean Graumann, Can you add him in to my [place] afternoon office schedule, double book if needed. He needs cbc, cmet, inr a day prior.  Thanks   ----- Message ----- From: Owens Shark, NP Sent: 12/24/2020   4:59 PM EST To: Milus Banister, MD  Dr. Ardis Hughs,   This is the patient Dr Benay Spice spoke with you about; new diagnosis HCC, severe anemia; needs evaluation for varices.  Thanks, Ned Card

## 2020-12-29 NOTE — Telephone Encounter (Signed)
Pt's daughter is requesting a missed call from the nurse.

## 2020-12-29 NOTE — Telephone Encounter (Signed)
Left message on machine to call back  

## 2020-12-29 NOTE — Progress Notes (Signed)
Spoke with patient's wife and will set up My Chart video visit for tomorrow 1/20 at 11:30.  I explained to her that Vikki Ports (their daughter) is not in the chart as someone we can discuss his status with.

## 2020-12-29 NOTE — Progress Notes (Signed)
Left voice message for patient as a follow up to him going to ED yesterday checking on his status.  I asked that he call me back on my direct line.

## 2020-12-30 ENCOUNTER — Inpatient Hospital Stay (HOSPITAL_BASED_OUTPATIENT_CLINIC_OR_DEPARTMENT_OTHER): Payer: BC Managed Care – PPO | Admitting: Oncology

## 2020-12-30 ENCOUNTER — Other Ambulatory Visit: Payer: Self-pay

## 2020-12-30 DIAGNOSIS — D649 Anemia, unspecified: Secondary | ICD-10-CM | POA: Diagnosis not present

## 2020-12-30 MED ORDER — PREDNISONE 5 MG PO TABS: ORAL_TABLET | ORAL | 0 refills | Status: AC

## 2020-12-30 NOTE — Telephone Encounter (Signed)
I have spoken to the pt's daughter and she was given the appt for tomorrow and also the need for labs.  She is concerned that she will not be able to get the pt here.  She says he is very weak and SOB.  She has a virtual visit coming up now with Dr Benay Spice and will discuss her concerns with him as well. FYI Dr Ardis Hughs

## 2020-12-30 NOTE — Progress Notes (Signed)
[place] OFFICE VISIT PROGRESS NOTE  I connected with Daniel Richard on 12/30/20 at 11:50 AM by video and verified that I am speaking with the correct person using two identifiers.   I discussed the limitations, risks, security and privacy concerns of performing an evaluation and management service by telemedicine and the availability of in-person appointments. I also discussed with the patient that there may be a patient responsible charge related to this service. The patient expressed understanding and agreed to proceed.  Other persons participating in the visit and their role in the encounter: Daughter  Patient's location: Home Provider's location: Office   Diagnosis: Poorly differentiated carcinoma  INTERVAL HISTORY:   Daniel Richard is seen today for a video visit per his request.  When he was here on 12/24/2020 the hemoglobin returned low.  He was transfused with 2 units of packed red blood cells on 12/25/2020.  He reports feeling mildly better after the red cell transfusion.  He continues to have severe exertional dyspnea.  He has dyspnea at rest and with ambulation.  He has difficulty ambulating in the home. He presented to the emergency room on 12/28/2020 after an episode of bright red blood per rectum.  This occurred after a firm bowel movement.  The hemoglobin returned at 7.9 compared to 6.3 prior to the Red cell transfusion.  Stool was Hemoccult positive.  He is scheduled to see Dr. Ardis Hughs tomorrow.  Daniel Richard reports it is very difficult for him to attend medical appointments due to exertional dyspnea.  He has mild improvement with nebulizer and inhaler treatment.  He is using home oxygen.  He reports improvement in dyspnea when he was treated with prednisone by Dr. Valeta Harms    Lab Results:  Lab Results  Component Value Date   WBC 23.5 (H) 12/28/2020   HGB 7.9 (L) 12/28/2020   HCT 25.1 (L) 12/28/2020   MCV 88.1  12/28/2020   PLT 284 12/28/2020   NEUTROABS 19.8 (H) 12/28/2020     Medications: I have reviewed the patient's current medications.  Assessment/Plan: 1. Poorly differentiated carcinoma status post biopsy of a liver lesion   Abdominal ultrasound 11/04/2020-contour of the liver irregular and mildly nodular;large, solid appearing mass with heterogeneous echotexture involving the right hepatic lobe measuring 13.6 x 11.6 x 12.7 cm.    CTs chest/abdomen/pelvis 11/04/2020-large 12.7 x 11.6 x 12.4 cm irregular multilobular mass lesion versus collection of clustered smaller lesions in the upper liver; 2.8 cm subcapsular lesion noted lateral right liver; probable tiny 8 mm lesion in the inferior right liver; no splenomegaly; 5.9 x 5.1 x 5.3 cm posterior right upper lobe pulmonary mass contiguous with the posterior aspect of the major fissure; 14 mm right paratracheal node; precarinal lymphadenopathy measuring up to 2.1 cm; 13 mm right hilar node; centrilobular and paraseptal emphysema.    Biopsy of right liver mass 11/19/2020-poorly differentiated carcinoma with immunoprofile most consistent with a poorly differentiated hepatocellular carcinoma  Bronchoscopy with endobronchial ultrasound and biopsies 12/14/2020- right upper lobe biopsy, benign lung tissue; right upper lobe brushing, no malignant cells identified; right upper lung lavage, no malignant cells identified; 4R lymph node, malignant cells consistent with poorly differentiated carcinoma (positive MOC31, Hepar-1, cytokeratin 7.  Negative for glypican-3, TTF-1, Napsin A, cytokeratin 5/6 and p40); morphology and immunohistochemistry similar to liver biopsy. 2. Atrial fibrillation 3. CAD/MI 4. COPD  5. History of tobacco and alcohol use 6. Anorexia/weight loss 7. Severe anemia 12/24/2020- potentially related to bleeding  2 units of packed red blood cells 12/25/2020   Disposition: Daniel Richard has been diagnosed with poorly differentiated  carcinoma involving a right liver mass and chest lymph nodes.  The clinical presentation is most consistent with hepatocellular carcinoma, though he could have synchronous primary tumors or metastatic lung cancer.  The AFP is normal.  He appears to have cirrhosis.  He is symptomatic with severe dyspnea.  The dyspnea is likely multifactorial including components from COPD, the right lung mass, restrictive disease from the liver mass, and severe anemia.  I encouraged him to use the nebulizer and inhalers.  He will continue home oxygen.  He reports partial relief when he was prescribed prednisone by Dr. Valeta Harms.  I will prescribe another steroid taper.  I encouraged him to follow-up with Dr. Ardis Hughs to evaluate him for cirrhosis and consider the indication for an upper endoscopy.  The bright red blood per rectum earlier this week may have been related to constipation and hemorrhoids.  He may not be a candidate for Avastin if he has varices.  He was also noted to have blood in the right upper lobe airway on bronchoscopy 12/14/2020.  We discussed a trial of systemic therapy versus hospice.  The anemia will be addressed further prior to making a decision on systemic therapy.  Daniel Richard stated that it is okay to discuss his medical information with his daughter.  He agrees to a return visit on 1/24/202  We discussed the risk and benefit of continue anticoagulation therapy.  He understands the risk of stroke when off of anticoagulation.  He agrees to discontinue apixaban.   I discussed the assessment and treatment plan with the patient. The patient was provided an opportunity to ask questions and all were answered. The patient agreed with the plan and demonstrated an understanding of the instructions.   The patient was advised to call back or seek an in-person evaluation if the symptoms worsen or if the condition fails to improve as anticipated.   I provided 30 minutes of video, chart review, and  documentation time during this encounter, and > 50% was spent counseling as documented under my assessment & plan.  Betsy Coder ANP/GNP-BC   12/30/2020 11:56 AM

## 2020-12-31 ENCOUNTER — Ambulatory Visit: Payer: BC Managed Care – PPO | Admitting: Gastroenterology

## 2020-12-31 ENCOUNTER — Inpatient Hospital Stay: Payer: BC Managed Care – PPO

## 2020-12-31 ENCOUNTER — Inpatient Hospital Stay: Payer: BC Managed Care – PPO | Admitting: Oncology

## 2021-01-03 ENCOUNTER — Inpatient Hospital Stay: Payer: BC Managed Care – PPO | Admitting: Oncology

## 2021-01-03 ENCOUNTER — Other Ambulatory Visit: Payer: Self-pay | Admitting: *Deleted

## 2021-01-03 ENCOUNTER — Inpatient Hospital Stay: Payer: BC Managed Care – PPO

## 2021-01-03 ENCOUNTER — Other Ambulatory Visit: Payer: Self-pay

## 2021-01-03 ENCOUNTER — Telehealth: Payer: Self-pay | Admitting: *Deleted

## 2021-01-03 ENCOUNTER — Telehealth: Payer: Self-pay

## 2021-01-03 VITALS — BP 103/68 | HR 80 | Temp 97.2°F | Resp 21 | Ht 72.0 in | Wt 175.6 lb

## 2021-01-03 DIAGNOSIS — R0600 Dyspnea, unspecified: Secondary | ICD-10-CM | POA: Diagnosis not present

## 2021-01-03 DIAGNOSIS — D649 Anemia, unspecified: Secondary | ICD-10-CM | POA: Diagnosis not present

## 2021-01-03 DIAGNOSIS — C220 Liver cell carcinoma: Secondary | ICD-10-CM | POA: Diagnosis not present

## 2021-01-03 DIAGNOSIS — R634 Abnormal weight loss: Secondary | ICD-10-CM | POA: Diagnosis not present

## 2021-01-03 DIAGNOSIS — R52 Pain, unspecified: Secondary | ICD-10-CM | POA: Diagnosis not present

## 2021-01-03 DIAGNOSIS — I4891 Unspecified atrial fibrillation: Secondary | ICD-10-CM | POA: Diagnosis not present

## 2021-01-03 DIAGNOSIS — R630 Anorexia: Secondary | ICD-10-CM | POA: Diagnosis not present

## 2021-01-03 DIAGNOSIS — Z79899 Other long term (current) drug therapy: Secondary | ICD-10-CM | POA: Diagnosis not present

## 2021-01-03 DIAGNOSIS — Z87891 Personal history of nicotine dependence: Secondary | ICD-10-CM | POA: Diagnosis not present

## 2021-01-03 DIAGNOSIS — Z7289 Other problems related to lifestyle: Secondary | ICD-10-CM | POA: Diagnosis not present

## 2021-01-03 DIAGNOSIS — Z7901 Long term (current) use of anticoagulants: Secondary | ICD-10-CM | POA: Diagnosis not present

## 2021-01-03 DIAGNOSIS — R918 Other nonspecific abnormal finding of lung field: Secondary | ICD-10-CM | POA: Diagnosis not present

## 2021-01-03 DIAGNOSIS — R609 Edema, unspecified: Secondary | ICD-10-CM | POA: Diagnosis not present

## 2021-01-03 DIAGNOSIS — C227 Other specified carcinomas of liver: Secondary | ICD-10-CM | POA: Diagnosis not present

## 2021-01-03 DIAGNOSIS — C778 Secondary and unspecified malignant neoplasm of lymph nodes of multiple regions: Secondary | ICD-10-CM | POA: Diagnosis not present

## 2021-01-03 DIAGNOSIS — J449 Chronic obstructive pulmonary disease, unspecified: Secondary | ICD-10-CM | POA: Diagnosis not present

## 2021-01-03 DIAGNOSIS — I2510 Atherosclerotic heart disease of native coronary artery without angina pectoris: Secondary | ICD-10-CM | POA: Diagnosis not present

## 2021-01-03 LAB — CBC WITH DIFFERENTIAL (CANCER CENTER ONLY)
Abs Immature Granulocytes: 0.61 10*3/uL — ABNORMAL HIGH (ref 0.00–0.07)
Basophils Absolute: 0 10*3/uL (ref 0.0–0.1)
Basophils Relative: 0 %
Eosinophils Absolute: 0 10*3/uL (ref 0.0–0.5)
Eosinophils Relative: 0 %
HCT: 20.8 % — ABNORMAL LOW (ref 39.0–52.0)
Hemoglobin: 6.5 g/dL — CL (ref 13.0–17.0)
Immature Granulocytes: 3 %
Lymphocytes Relative: 7 %
Lymphs Abs: 1.6 10*3/uL (ref 0.7–4.0)
MCH: 27 pg (ref 26.0–34.0)
MCHC: 31.3 g/dL (ref 30.0–36.0)
MCV: 86.3 fL (ref 80.0–100.0)
Monocytes Absolute: 2.1 10*3/uL — ABNORMAL HIGH (ref 0.1–1.0)
Monocytes Relative: 9 %
Neutro Abs: 18 10*3/uL — ABNORMAL HIGH (ref 1.7–7.7)
Neutrophils Relative %: 81 %
Platelet Count: 335 10*3/uL (ref 150–400)
RBC: 2.41 MIL/uL — ABNORMAL LOW (ref 4.22–5.81)
RDW: 17.8 % — ABNORMAL HIGH (ref 11.5–15.5)
WBC Count: 22.3 10*3/uL — ABNORMAL HIGH (ref 4.0–10.5)
nRBC: 0.1 % (ref 0.0–0.2)

## 2021-01-03 LAB — PREPARE RBC (CROSSMATCH)

## 2021-01-03 LAB — SAVE SMEAR(SSMR), FOR PROVIDER SLIDE REVIEW

## 2021-01-03 LAB — RETICULOCYTES
Immature Retic Fract: 28.9 % — ABNORMAL HIGH (ref 2.3–15.9)
RBC.: 2.42 MIL/uL — ABNORMAL LOW (ref 4.22–5.81)
Retic Count, Absolute: 160 10*3/uL (ref 19.0–186.0)
Retic Ct Pct: 6.6 % — ABNORMAL HIGH (ref 0.4–3.1)

## 2021-01-03 LAB — SAMPLE TO BLOOD BANK

## 2021-01-03 MED ORDER — HEPARIN SOD (PORK) LOCK FLUSH 100 UNIT/ML IV SOLN
500.0000 [IU] | Freq: Every day | INTRAVENOUS | Status: DC | PRN
  Filled 2021-01-03: qty 5

## 2021-01-03 MED ORDER — HYDROCODONE-ACETAMINOPHEN 5-325 MG PO TABS: 1.0000 | ORAL_TABLET | Freq: Four times a day (QID) | ORAL | 0 refills | Status: AC | PRN

## 2021-01-03 MED ORDER — SODIUM CHLORIDE 0.9% FLUSH
10.0000 mL | INTRAVENOUS | Status: DC | PRN
  Filled 2021-01-03: qty 10

## 2021-01-03 MED ORDER — SODIUM CHLORIDE 0.9% IV SOLUTION
250.0000 mL | Freq: Once | INTRAVENOUS | Status: AC
  Administered 2021-01-03: 250 mL via INTRAVENOUS
  Filled 2021-01-03: qty 250

## 2021-01-03 NOTE — Telephone Encounter (Signed)
Spoke to patient's wife who in turn asked him if he thought he could make it in for an appointment with Dr. Benay Spice today.  He states "he will try".  I asked them to be here at 11:30 for lab work and then see Dr. Benay Spice at 59. They verbalized an understanding.

## 2021-01-03 NOTE — Patient Instructions (Signed)

## 2021-01-03 NOTE — Telephone Encounter (Signed)
Referral called to [facility]

## 2021-01-03 NOTE — Progress Notes (Addendum)
[place] OFFICE PROGRESS NOTE   Diagnosis: Hepatocellular carcinoma  INTERVAL HISTORY:   Daniel Richard returns for a scheduled visit.  He is here with his wife.  He continues to feel "terrible ".  He complains of dyspnea.  He has diffuse aching.  He takes hydrocodone as needed.  No bleeding.  He decided to cancel the appointment with Dr. Ardis Hughs.  He is not sure whether he started the prednisone I prescribed on 01/30/2021.  He has difficulty ambulating secondary to dyspnea.  Objective:  Vital signs in last 24 hours:  Blood pressure 103/68, pulse 80, temperature (!) 97.2 F (36.2 C), temperature source Temporal, resp. rate (!) 21, height 6' (1.829 m), weight 175 lb 9.6 oz (79.7 kg), SpO2 92 %.    HEENT: The tongue is dry, no thrush Resp: Decreased breath sounds at the right lower chest Cardio: Irregular GI: Mild fullness in the right upper abdomen with associated tenderness Vascular: Pitting edema at the left greater than right lower leg   Lab Results:  Lab Results  Component Value Date   WBC 22.3 (H) 01/03/2021   HGB 6.5 (LL) 01/03/2021   HCT 20.8 (L) 01/03/2021   MCV 86.3 01/03/2021   PLT 335 01/03/2021   NEUTROABS 18.0 (H) 01/03/2021    CMP  Lab Results  Component Value Date   NA 126 (L) 12/28/2020   K 4.2 12/28/2020   CL 92 (L) 12/28/2020   CO2 23 12/28/2020   GLUCOSE 108 (H) 12/28/2020   BUN 11 12/28/2020   CREATININE 0.58 (L) 12/28/2020   CALCIUM 8.1 (L) 12/28/2020   PROT 6.5 12/28/2020   ALBUMIN 2.1 (L) 12/28/2020   AST 38 12/28/2020   ALT 32 12/28/2020   ALKPHOS 284 (H) 12/28/2020   BILITOT 1.5 (H) 12/28/2020   GFRNONAA >60 12/28/2020   GFRAA >60 02/01/2017    Medications: I have reviewed the patient's current medications.   Assessment/Plan: 1. Poorly differentiated carcinoma status post biopsy of a liver lesion   Abdominal ultrasound 11/04/2020-contour of the liver irregular and mildly nodular;large, solid appearing mass with  heterogeneous echotexture involving the right hepatic lobe measuring 13.6 x 11.6 x 12.7 cm.    CTs chest/abdomen/pelvis 11/04/2020-large 12.7 x 11.6 x 12.4 cm irregular multilobular mass lesion versus collection of clustered smaller lesions in the upper liver; 2.8 cm subcapsular lesion noted lateral right liver; probable tiny 8 mm lesion in the inferior right liver; no splenomegaly; 5.9 x 5.1 x 5.3 cm posterior right upper lobe pulmonary mass contiguous with the posterior aspect of the major fissure; 14 mm right paratracheal node; precarinal lymphadenopathy measuring up to 2.1 cm; 13 mm right hilar node; centrilobular and paraseptal emphysema.    Biopsy of right liver mass 11/19/2020-poorly differentiated carcinoma with immunoprofile most consistent with a poorly differentiated hepatocellular carcinoma  Bronchoscopy with endobronchial ultrasound and biopsies 12/14/2020- right upper lobe biopsy, benign lung tissue; right upper lobe brushing, no malignant cells identified; right upper lung lavage, no malignant cells identified; 4R lymph node, malignant cells consistent with poorly differentiated carcinoma (positive MOC31, Hepar-1, cytokeratin 7.  Negative for glypican-3, TTF-1, Napsin A, cytokeratin 5/6 and p40); morphology and immunohistochemistry similar to liver biopsy. 2. Atrial fibrillation 3. CAD/MI 4. COPD  5. History of tobacco and alcohol use 6. Anorexia/weight loss 7. Severe anemia 12/24/2020- potentially related to bleeding  2 units of packed red blood cells 12/25/2020     Disposition: Daniel Richard has been diagnosed with "hepatocellular carcinoma ".  He also has  a mass in the right lung and chest adenopathy.  It is not clear whether he has separate primary tumor sites or metastatic disease from either the lung or liver.  He has a poor performance status.  He is symptomatic with dyspnea and pain.  Daniel Richard stated repeatedly today that he does not wish to undergo further diagnostic  evaluation or treatment.  He does not want to go in the hospital.  He agrees to a red cell transfusion today.  The anemia may be related to bleeding or another process.  He could have tumor in the bone marrow.  I will review the peripheral blood smear today.  Daniel Richard agrees to a hospice referral for home hospice care.  We discussed CPR and ACLS.  He will be placed on a no CODE BLUE status.  He does not wish to schedule follow-up at the Cancer center.  I am available to see him as needed.  Anticoagulation therapy was discontinued last week.  I refilled his prescription for hydrocodone.  Betsy Coder, MD  01/03/2021  12:35 PM I reviewed the peripheral blood smear since there is no clear explanation for the anemia. The polychromasia is increased.  Few ovalocytes.  No nucleated red cells.  The platelets appear normal in number.  The majority of the white cells are mature neutrophils.  Few myelocytes.  Increased number of plasmacytoid lymphocytes.  I saw 1 blast cell.  We will obtain a myeloma panel to be sure he does not have a plasmacytic neoplasm.

## 2021-01-04 LAB — MULTIPLE MYELOMA PANEL, SERUM
Albumin SerPl Elph-Mcnc: 2.1 g/dL — ABNORMAL LOW (ref 2.9–4.4)
Albumin/Glob SerPl: 0.6 — ABNORMAL LOW (ref 0.7–1.7)
Alpha 1: 0.7 g/dL — ABNORMAL HIGH (ref 0.0–0.4)
Alpha2 Glob SerPl Elph-Mcnc: 0.6 g/dL (ref 0.4–1.0)
B-Globulin SerPl Elph-Mcnc: 1.3 g/dL (ref 0.7–1.3)
Gamma Glob SerPl Elph-Mcnc: 1.2 g/dL (ref 0.4–1.8)
Globulin, Total: 3.8 g/dL (ref 2.2–3.9)
IgA: 658 mg/dL — ABNORMAL HIGH (ref 61–437)
IgG (Immunoglobin G), Serum: 1172 mg/dL (ref 603–1613)
IgM (Immunoglobulin M), Srm: 63 mg/dL (ref 20–172)
Total Protein ELP: 5.9 g/dL — ABNORMAL LOW (ref 6.0–8.5)

## 2021-01-04 LAB — BPAM RBC
Blood Product Expiration Date: 202202192359
Blood Product Expiration Date: 202202192359
ISSUE DATE / TIME: 202201241328
ISSUE DATE / TIME: 202201241330
Unit Type and Rh: 5100
Unit Type and Rh: 5100

## 2021-01-04 LAB — TYPE AND SCREEN
ABO/RH(D): O POS
Antibody Screen: NEGATIVE
Unit division: 0
Unit division: 0

## 2021-01-04 LAB — KAPPA/LAMBDA LIGHT CHAINS
Kappa free light chain: 55.6 mg/L — ABNORMAL HIGH (ref 3.3–19.4)
Kappa, lambda light chain ratio: 1.26 (ref 0.26–1.65)
Lambda free light chains: 44.3 mg/L — ABNORMAL HIGH (ref 5.7–26.3)

## 2021-01-05 ENCOUNTER — Other Ambulatory Visit: Payer: Self-pay

## 2021-01-05 ENCOUNTER — Telehealth: Payer: Self-pay

## 2021-01-05 DIAGNOSIS — C229 Malignant neoplasm of liver, not specified as primary or secondary: Secondary | ICD-10-CM | POA: Diagnosis not present

## 2021-01-05 DIAGNOSIS — J449 Chronic obstructive pulmonary disease, unspecified: Secondary | ICD-10-CM | POA: Diagnosis not present

## 2021-01-05 DIAGNOSIS — I2510 Atherosclerotic heart disease of native coronary artery without angina pectoris: Secondary | ICD-10-CM | POA: Diagnosis not present

## 2021-01-05 DIAGNOSIS — C3411 Malignant neoplasm of upper lobe, right bronchus or lung: Secondary | ICD-10-CM | POA: Diagnosis not present

## 2021-01-05 DIAGNOSIS — J302 Other seasonal allergic rhinitis: Secondary | ICD-10-CM | POA: Diagnosis not present

## 2021-01-05 DIAGNOSIS — I4891 Unspecified atrial fibrillation: Secondary | ICD-10-CM | POA: Diagnosis not present

## 2021-01-05 DIAGNOSIS — Z6823 Body mass index (BMI) 23.0-23.9, adult: Secondary | ICD-10-CM | POA: Diagnosis not present

## 2021-01-05 DIAGNOSIS — I10 Essential (primary) hypertension: Secondary | ICD-10-CM | POA: Diagnosis not present

## 2021-01-05 DIAGNOSIS — E785 Hyperlipidemia, unspecified: Secondary | ICD-10-CM | POA: Diagnosis not present

## 2021-01-05 DIAGNOSIS — K922 Gastrointestinal hemorrhage, unspecified: Secondary | ICD-10-CM | POA: Diagnosis not present

## 2021-01-05 DIAGNOSIS — Z87891 Personal history of nicotine dependence: Secondary | ICD-10-CM | POA: Diagnosis not present

## 2021-01-05 DIAGNOSIS — Z95810 Presence of automatic (implantable) cardiac defibrillator: Secondary | ICD-10-CM | POA: Diagnosis not present

## 2021-01-05 MED ORDER — LORAZEPAM 0.5 MG PO TABS: 0.5000 mg | ORAL_TABLET | Freq: Three times a day (TID) | ORAL | 0 refills | Status: AC | PRN

## 2021-01-05 NOTE — Telephone Encounter (Signed)
Per Ned Card NP new prescription for Ativan 0.5 mg po every 8 hours as needed to be prescribed for anxiety /sleep.

## 2021-01-05 NOTE — Addendum Note (Signed)
Addended by: Shawn Stall on: 01/05/2021 03:48 PM   Modules accepted: Orders

## 2021-01-05 NOTE — Telephone Encounter (Signed)
[facility] called in Sherian Rein) to report : Pt is having increased anxiety and unable to sleep. Can he have something to help? Also reported that 1 pill of the Norco was not helping with his pain and pt states that 2 pills make him feel weird. Is there some other option ? NP notified.

## 2021-01-06 DIAGNOSIS — C3411 Malignant neoplasm of upper lobe, right bronchus or lung: Secondary | ICD-10-CM | POA: Diagnosis not present

## 2021-01-06 DIAGNOSIS — Z95810 Presence of automatic (implantable) cardiac defibrillator: Secondary | ICD-10-CM | POA: Diagnosis not present

## 2021-01-06 DIAGNOSIS — I10 Essential (primary) hypertension: Secondary | ICD-10-CM | POA: Diagnosis not present

## 2021-01-06 DIAGNOSIS — C229 Malignant neoplasm of liver, not specified as primary or secondary: Secondary | ICD-10-CM | POA: Diagnosis not present

## 2021-01-06 DIAGNOSIS — E785 Hyperlipidemia, unspecified: Secondary | ICD-10-CM | POA: Diagnosis not present

## 2021-01-06 DIAGNOSIS — J302 Other seasonal allergic rhinitis: Secondary | ICD-10-CM | POA: Diagnosis not present

## 2021-01-06 DIAGNOSIS — Z6823 Body mass index (BMI) 23.0-23.9, adult: Secondary | ICD-10-CM | POA: Diagnosis not present

## 2021-01-06 DIAGNOSIS — K922 Gastrointestinal hemorrhage, unspecified: Secondary | ICD-10-CM | POA: Diagnosis not present

## 2021-01-06 DIAGNOSIS — I4891 Unspecified atrial fibrillation: Secondary | ICD-10-CM | POA: Diagnosis not present

## 2021-01-06 DIAGNOSIS — Z87891 Personal history of nicotine dependence: Secondary | ICD-10-CM | POA: Diagnosis not present

## 2021-01-06 DIAGNOSIS — J449 Chronic obstructive pulmonary disease, unspecified: Secondary | ICD-10-CM | POA: Diagnosis not present

## 2021-01-06 DIAGNOSIS — I2510 Atherosclerotic heart disease of native coronary artery without angina pectoris: Secondary | ICD-10-CM | POA: Diagnosis not present

## 2021-01-07 DIAGNOSIS — Z87891 Personal history of nicotine dependence: Secondary | ICD-10-CM | POA: Diagnosis not present

## 2021-01-07 DIAGNOSIS — K922 Gastrointestinal hemorrhage, unspecified: Secondary | ICD-10-CM | POA: Diagnosis not present

## 2021-01-07 DIAGNOSIS — C3411 Malignant neoplasm of upper lobe, right bronchus or lung: Secondary | ICD-10-CM | POA: Diagnosis not present

## 2021-01-07 DIAGNOSIS — Z95810 Presence of automatic (implantable) cardiac defibrillator: Secondary | ICD-10-CM | POA: Diagnosis not present

## 2021-01-07 DIAGNOSIS — E785 Hyperlipidemia, unspecified: Secondary | ICD-10-CM | POA: Diagnosis not present

## 2021-01-07 DIAGNOSIS — I2510 Atherosclerotic heart disease of native coronary artery without angina pectoris: Secondary | ICD-10-CM | POA: Diagnosis not present

## 2021-01-07 DIAGNOSIS — I10 Essential (primary) hypertension: Secondary | ICD-10-CM | POA: Diagnosis not present

## 2021-01-07 DIAGNOSIS — I4891 Unspecified atrial fibrillation: Secondary | ICD-10-CM | POA: Diagnosis not present

## 2021-01-07 DIAGNOSIS — C229 Malignant neoplasm of liver, not specified as primary or secondary: Secondary | ICD-10-CM | POA: Diagnosis not present

## 2021-01-07 DIAGNOSIS — J449 Chronic obstructive pulmonary disease, unspecified: Secondary | ICD-10-CM | POA: Diagnosis not present

## 2021-01-07 DIAGNOSIS — J302 Other seasonal allergic rhinitis: Secondary | ICD-10-CM | POA: Diagnosis not present

## 2021-01-07 DIAGNOSIS — Z6823 Body mass index (BMI) 23.0-23.9, adult: Secondary | ICD-10-CM | POA: Diagnosis not present

## 2021-01-08 DIAGNOSIS — Z6823 Body mass index (BMI) 23.0-23.9, adult: Secondary | ICD-10-CM | POA: Diagnosis not present

## 2021-01-08 DIAGNOSIS — I2510 Atherosclerotic heart disease of native coronary artery without angina pectoris: Secondary | ICD-10-CM | POA: Diagnosis not present

## 2021-01-08 DIAGNOSIS — I4891 Unspecified atrial fibrillation: Secondary | ICD-10-CM | POA: Diagnosis not present

## 2021-01-08 DIAGNOSIS — J449 Chronic obstructive pulmonary disease, unspecified: Secondary | ICD-10-CM | POA: Diagnosis not present

## 2021-01-08 DIAGNOSIS — Z95810 Presence of automatic (implantable) cardiac defibrillator: Secondary | ICD-10-CM | POA: Diagnosis not present

## 2021-01-08 DIAGNOSIS — I10 Essential (primary) hypertension: Secondary | ICD-10-CM | POA: Diagnosis not present

## 2021-01-08 DIAGNOSIS — K922 Gastrointestinal hemorrhage, unspecified: Secondary | ICD-10-CM | POA: Diagnosis not present

## 2021-01-08 DIAGNOSIS — C229 Malignant neoplasm of liver, not specified as primary or secondary: Secondary | ICD-10-CM | POA: Diagnosis not present

## 2021-01-08 DIAGNOSIS — C3411 Malignant neoplasm of upper lobe, right bronchus or lung: Secondary | ICD-10-CM | POA: Diagnosis not present

## 2021-01-08 DIAGNOSIS — J302 Other seasonal allergic rhinitis: Secondary | ICD-10-CM | POA: Diagnosis not present

## 2021-01-08 DIAGNOSIS — Z87891 Personal history of nicotine dependence: Secondary | ICD-10-CM | POA: Diagnosis not present

## 2021-01-08 DIAGNOSIS — E785 Hyperlipidemia, unspecified: Secondary | ICD-10-CM | POA: Diagnosis not present

## 2021-01-09 DIAGNOSIS — E785 Hyperlipidemia, unspecified: Secondary | ICD-10-CM | POA: Diagnosis not present

## 2021-01-09 DIAGNOSIS — C229 Malignant neoplasm of liver, not specified as primary or secondary: Secondary | ICD-10-CM | POA: Diagnosis not present

## 2021-01-09 DIAGNOSIS — J302 Other seasonal allergic rhinitis: Secondary | ICD-10-CM | POA: Diagnosis not present

## 2021-01-09 DIAGNOSIS — Z6823 Body mass index (BMI) 23.0-23.9, adult: Secondary | ICD-10-CM | POA: Diagnosis not present

## 2021-01-09 DIAGNOSIS — C3411 Malignant neoplasm of upper lobe, right bronchus or lung: Secondary | ICD-10-CM | POA: Diagnosis not present

## 2021-01-09 DIAGNOSIS — I2510 Atherosclerotic heart disease of native coronary artery without angina pectoris: Secondary | ICD-10-CM | POA: Diagnosis not present

## 2021-01-09 DIAGNOSIS — Z87891 Personal history of nicotine dependence: Secondary | ICD-10-CM | POA: Diagnosis not present

## 2021-01-09 DIAGNOSIS — J449 Chronic obstructive pulmonary disease, unspecified: Secondary | ICD-10-CM | POA: Diagnosis not present

## 2021-01-09 DIAGNOSIS — K922 Gastrointestinal hemorrhage, unspecified: Secondary | ICD-10-CM | POA: Diagnosis not present

## 2021-01-09 DIAGNOSIS — I4891 Unspecified atrial fibrillation: Secondary | ICD-10-CM | POA: Diagnosis not present

## 2021-01-09 DIAGNOSIS — Z95810 Presence of automatic (implantable) cardiac defibrillator: Secondary | ICD-10-CM | POA: Diagnosis not present

## 2021-01-09 DIAGNOSIS — I10 Essential (primary) hypertension: Secondary | ICD-10-CM | POA: Diagnosis not present

## 2021-01-10 DIAGNOSIS — I10 Essential (primary) hypertension: Secondary | ICD-10-CM | POA: Diagnosis not present

## 2021-01-10 DIAGNOSIS — Z6823 Body mass index (BMI) 23.0-23.9, adult: Secondary | ICD-10-CM | POA: Diagnosis not present

## 2021-01-10 DIAGNOSIS — J302 Other seasonal allergic rhinitis: Secondary | ICD-10-CM | POA: Diagnosis not present

## 2021-01-10 DIAGNOSIS — Z87891 Personal history of nicotine dependence: Secondary | ICD-10-CM | POA: Diagnosis not present

## 2021-01-10 DIAGNOSIS — I4891 Unspecified atrial fibrillation: Secondary | ICD-10-CM | POA: Diagnosis not present

## 2021-01-10 DIAGNOSIS — Z95810 Presence of automatic (implantable) cardiac defibrillator: Secondary | ICD-10-CM | POA: Diagnosis not present

## 2021-01-10 DIAGNOSIS — C229 Malignant neoplasm of liver, not specified as primary or secondary: Secondary | ICD-10-CM | POA: Diagnosis not present

## 2021-01-10 DIAGNOSIS — I2510 Atherosclerotic heart disease of native coronary artery without angina pectoris: Secondary | ICD-10-CM | POA: Diagnosis not present

## 2021-01-10 DIAGNOSIS — E785 Hyperlipidemia, unspecified: Secondary | ICD-10-CM | POA: Diagnosis not present

## 2021-01-10 DIAGNOSIS — C3411 Malignant neoplasm of upper lobe, right bronchus or lung: Secondary | ICD-10-CM | POA: Diagnosis not present

## 2021-01-10 DIAGNOSIS — J449 Chronic obstructive pulmonary disease, unspecified: Secondary | ICD-10-CM | POA: Diagnosis not present

## 2021-01-10 DIAGNOSIS — K922 Gastrointestinal hemorrhage, unspecified: Secondary | ICD-10-CM | POA: Diagnosis not present

## 2021-01-11 ENCOUNTER — Other Ambulatory Visit: Payer: Self-pay

## 2021-01-11 ENCOUNTER — Emergency Department (HOSPITAL_COMMUNITY)
Admission: EM | Admit: 2021-01-11 | Discharge: 2021-01-11 | Disposition: A | Discharge status: Home / Self Care | Payer: BC Managed Care – PPO | Attending: Emergency Medicine | Admitting: Emergency Medicine

## 2021-01-11 DIAGNOSIS — E785 Hyperlipidemia, unspecified: Secondary | ICD-10-CM | POA: Diagnosis not present

## 2021-01-11 DIAGNOSIS — Z4502 Encounter for adjustment and management of automatic implantable cardiac defibrillator: Secondary | ICD-10-CM | POA: Diagnosis not present

## 2021-01-11 DIAGNOSIS — Z79899 Other long term (current) drug therapy: Secondary | ICD-10-CM | POA: Diagnosis not present

## 2021-01-11 DIAGNOSIS — Z87891 Personal history of nicotine dependence: Secondary | ICD-10-CM | POA: Insufficient documentation

## 2021-01-11 DIAGNOSIS — I471 Supraventricular tachycardia: Secondary | ICD-10-CM | POA: Diagnosis not present

## 2021-01-11 DIAGNOSIS — I10 Essential (primary) hypertension: Secondary | ICD-10-CM | POA: Insufficient documentation

## 2021-01-11 DIAGNOSIS — K922 Gastrointestinal hemorrhage, unspecified: Secondary | ICD-10-CM | POA: Diagnosis not present

## 2021-01-11 DIAGNOSIS — I4891 Unspecified atrial fibrillation: Secondary | ICD-10-CM | POA: Diagnosis not present

## 2021-01-11 DIAGNOSIS — T82897A Other specified complication of cardiac prosthetic devices, implants and grafts, initial encounter: Secondary | ICD-10-CM | POA: Insufficient documentation

## 2021-01-11 DIAGNOSIS — R509 Fever, unspecified: Secondary | ICD-10-CM | POA: Diagnosis not present

## 2021-01-11 DIAGNOSIS — J302 Other seasonal allergic rhinitis: Secondary | ICD-10-CM | POA: Diagnosis not present

## 2021-01-11 DIAGNOSIS — Y840 Cardiac catheterization as the cause of abnormal reaction of the patient, or of later complication, without mention of misadventure at the time of the procedure: Secondary | ICD-10-CM | POA: Insufficient documentation

## 2021-01-11 DIAGNOSIS — I2510 Atherosclerotic heart disease of native coronary artery without angina pectoris: Secondary | ICD-10-CM | POA: Insufficient documentation

## 2021-01-11 DIAGNOSIS — Z7901 Long term (current) use of anticoagulants: Secondary | ICD-10-CM | POA: Diagnosis not present

## 2021-01-11 DIAGNOSIS — C229 Malignant neoplasm of liver, not specified as primary or secondary: Secondary | ICD-10-CM | POA: Diagnosis not present

## 2021-01-11 DIAGNOSIS — Z20822 Contact with and (suspected) exposure to covid-19: Secondary | ICD-10-CM | POA: Insufficient documentation

## 2021-01-11 DIAGNOSIS — J449 Chronic obstructive pulmonary disease, unspecified: Secondary | ICD-10-CM | POA: Diagnosis not present

## 2021-01-11 DIAGNOSIS — Z515 Encounter for palliative care: Secondary | ICD-10-CM | POA: Insufficient documentation

## 2021-01-11 DIAGNOSIS — C3411 Malignant neoplasm of upper lobe, right bronchus or lung: Secondary | ICD-10-CM | POA: Diagnosis not present

## 2021-01-11 DIAGNOSIS — Z6823 Body mass index (BMI) 23.0-23.9, adult: Secondary | ICD-10-CM | POA: Diagnosis not present

## 2021-01-11 DIAGNOSIS — Z95810 Presence of automatic (implantable) cardiac defibrillator: Secondary | ICD-10-CM | POA: Diagnosis not present

## 2021-01-11 LAB — BASIC METABOLIC PANEL
Anion gap: 11 (ref 5–15)
BUN: 13 mg/dL (ref 8–23)
CO2: 25 mmol/L (ref 22–32)
Calcium: 8.2 mg/dL — ABNORMAL LOW (ref 8.9–10.3)
Chloride: 95 mmol/L — ABNORMAL LOW (ref 98–111)
Creatinine, Ser: 0.69 mg/dL (ref 0.61–1.24)
GFR, Estimated: >60 mL/min (ref >60)
Glucose, Bld: 90 mg/dL (ref 70–99)
Potassium: 4 mmol/L (ref 3.5–5.1)
Sodium: 131 mmol/L — ABNORMAL LOW (ref 135–145)

## 2021-01-11 LAB — CBC WITH DIFFERENTIAL/PLATELET
Abs Immature Granulocytes: 0.6 10*3/uL — ABNORMAL HIGH (ref 0.00–0.07)
Basophils Absolute: 0 10*3/uL (ref 0.0–0.1)
Basophils Relative: 0 %
Eosinophils Absolute: 0.3 10*3/uL (ref 0.0–0.5)
Eosinophils Relative: 1 %
HCT: 26.4 % — ABNORMAL LOW (ref 39.0–52.0)
Hemoglobin: 8.3 g/dL — ABNORMAL LOW (ref 13.0–17.0)
Lymphocytes Relative: 1 %
Lymphs Abs: 0.3 10*3/uL — ABNORMAL LOW (ref 0.7–4.0)
MCH: 29.1 pg (ref 26.0–34.0)
MCHC: 31.4 g/dL (ref 30.0–36.0)
MCV: 92.6 fL (ref 80.0–100.0)
Monocytes Absolute: 1.2 10*3/uL — ABNORMAL HIGH (ref 0.1–1.0)
Monocytes Relative: 4 %
Myelocytes: 1 %
Neutro Abs: 27.6 10*3/uL — ABNORMAL HIGH (ref 1.7–7.7)
Neutrophils Relative %: 92 %
Platelets: 259 10*3/uL (ref 150–400)
Promyelocytes Relative: 1 %
RBC: 2.85 MIL/uL — ABNORMAL LOW (ref 4.22–5.81)
RDW: 17.6 % — ABNORMAL HIGH (ref 11.5–15.5)
WBC: 30 10*3/uL — ABNORMAL HIGH (ref 4.0–10.5)
nRBC: 0 % (ref 0.0–0.2)
nRBC: 2 /100 WBC — ABNORMAL HIGH

## 2021-01-11 LAB — MAGNESIUM: Magnesium: 1.8 mg/dL (ref 1.7–2.4)

## 2021-01-11 LAB — SARS CORONAVIRUS 2 BY RT PCR (HOSPITAL ORDER, PERFORMED IN ~~LOC~~ HOSPITAL LAB): SARS Coronavirus 2: NEGATIVE

## 2021-01-11 LAB — POC SARS CORONAVIRUS 2 AG -  ED: SARS Coronavirus 2 Ag: NEGATIVE

## 2021-01-11 MED ORDER — OXYCODONE-ACETAMINOPHEN 5-325 MG PO TABS
1.0000 | ORAL_TABLET | Freq: Once | ORAL | Status: AC
  Administered 2021-01-11: 1 via ORAL
  Filled 2021-01-11: qty 1

## 2021-01-11 MED ORDER — MORPHINE SULFATE (PF) 4 MG/ML IV SOLN
4.0000 mg | Freq: Once | INTRAVENOUS | Status: AC
  Administered 2021-01-11: 4 mg via INTRAVENOUS
  Filled 2021-01-11: qty 1

## 2021-01-11 MED ORDER — ACETAMINOPHEN 500 MG PO TABS
1000.0000 mg | ORAL_TABLET | Freq: Once | ORAL | Status: AC
  Administered 2021-01-11: 1000 mg via ORAL
  Filled 2021-01-11: qty 2

## 2021-01-11 MED ORDER — LORAZEPAM 1 MG PO TABS
1.0000 mg | ORAL_TABLET | Freq: Once | ORAL | Status: AC
  Administered 2021-01-11: 1 mg via ORAL
  Filled 2021-01-11: qty 1

## 2021-01-11 NOTE — ED Notes (Signed)
Food provided for pt.

## 2021-01-11 NOTE — Progress Notes (Signed)
Brief Palliative Medicine Progress Note:  PMT consult received and chart reviewed.   Noted Mr. Ladouceur is a current hospice patient with [facility] He has presented to the ED to have his AICD turned off. Discussed case with Threasa Beards, PMT RN - she has been trying to contact [employer] organization to have device deactivated. I called [employer] and rep has been contacted for device deactivation in [facility] ED.   Notified attending of recommendation to allow patient to return home as soon as AICD is turned off as he is at a high risk of decline if he stays (may become too unstable for transport home).   I was also notified by primary RN that patient's wife was requesting patient stay overnight so she could sleep. I notified [facility] liaison of wife's concerns - liaison will reach out to wife and provide support staff to assist her.   Updated information was received that device was deactivated 1 hour ago.   Thank you for allowing PMT to assist in the care of this patient.  Cesia Orf M. Tamala Julian, FNP-BC Palliative Medicine Team Team Phone: [phone]  Total time: 15 minutes Greater than 50%  of this time was spent counseling and coordinating care related to the above assessment and plan.

## 2021-01-11 NOTE — Progress Notes (Signed)
Palliative Medicine RN Note: Consult order noted for "Palliative Care." Patient is an active pt with [facility] under their hospice arm.   Mr Yusko has requested that his AICD be turned off, and I see that the order has been placed in his chart. By chart review, pt has an [employer] device, and I don't have a number for their liaison. I paged the Cards Master for contact information. Did not rec'v a call back, but I did call the HF team who provided contact information. PMT NP Amber paged their rep to the PMT office.  Marjie Skiff Thorin Starner, RN, BSN, [facility] Palliative Medicine Team 01/11/2021 3:51 PM Office [phone]   ADDENDUM: Spoke w [employer] liaison. Device was deactivated about an hour ago.  Marjie Skiff Crystian Frith, RN, BSN, [facility] Palliative Medicine Team 01/11/2021 3:55 PM Office [phone]

## 2021-01-11 NOTE — ED Notes (Signed)
Ptar called by Raia Amico  pt is number 7 on the list

## 2021-01-11 NOTE — Progress Notes (Signed)
[facility])   This patient was admitted to hospice services on 01/05/21 with a terminal diagnosis of lung cancer.  He was admitted with a life expectancy of >2 weeks.  Per RN notes he has been sleeping more and needing a new rx for ativan to address increased anxiety.  AID was not discontinued prior to hospice admission.  [facility] will continue to follow for any discharge planning needs and to coordinate continuation of hospice care.  Thank you for the opportunity to participate in this patient's care.     Domenic Moras, BSN, RN [facility] Liaison   [phone] (24h on call)

## 2021-01-11 NOTE — ED Provider Notes (Signed)
[facility] EMERGENCY DEPARTMENT Provider Note   CSN: [id number] Arrival date & time: 01/11/21  1339     History Chief Complaint  Patient presents with  . Pacemaker Problem    Daniel Richard is a 65 y.o. male.  History of A. fib, liver and lung cancer, likely primary liver presents to ER for device firing.  Has an AICD.  Patient recently enrolled in hospice care services.  Has DNR/DNI.  Patient states earlier this afternoon he was at home when his device started firing, went off around 20 times.  States that he has no pain at present.  No difficulty breathing, no chest pain.  Patient and wife and daughter state they are all in agreement that he does not want any aggressive care and wants his care to be only focused on comfort measures.  They all agree they would like his device turned off.  HPI     Past Medical History:  Diagnosis Date  . AICD (automatic cardioverter/defibrillator) present    ST JUDE  . ALLERGIC RHINITIS   . ANXIETY   . Arthritis    IN FINGERS  . Cardiac arrest ([place]) 11/21/2015  . Cardiac device in situ   . Coronary artery disease   . HYPERLIPIDEMIA   . HYPERTENSION   . LOW BACK PAIN   . Myocardial infarction ([place]) 11/21/2015  . Paroxysmal atrial fibrillation [facility])     Patient Active Problem List   Diagnosis Date Noted  . S/P bronchoscopy with biopsy   . Aortic atherosclerosis ([place]) 11/09/2020  . Pulmonary mass 11/09/2020  . Liver mass 11/09/2020  . Atrial fibrillation with rapid ventricular response ([place]) 10/13/2020  . LLQ pain 12/17/2019  . Insomnia 10/11/2019  . Left inguinal hernia 11/28/2016  . Hyperlipidemia LDL goal <70 11/30/2015  . Cardiac device in situ   . Sinus tachycardia   . Intercostal pain   . Coronary artery disease involving native coronary artery of native heart with angina pectoris ([place])   . Paroxysmal atrial fibrillation (H[facility])   . Cardiac arrest ([place]) 11/21/2015  . Headache(784.0) 03/29/2014  .  Occipital neuralgia of left side 03/25/2014  . Unspecified sinusitis (chronic) 03/25/2014  . Mouth pain 01/30/2014  . Myalgia 08/29/2013  . Encounter for well adult exam with abnormal findings 02/21/2013  . Anxiety state 07/16/2007  . Essential hypertension, benign 07/16/2007  . ALLERGIC RHINITIS 07/16/2007  . LOW BACK PAIN 07/16/2007    Past Surgical History:  Procedure Laterality Date  . BRONCHIAL BIOPSY  12/14/2020   Procedure: BRONCHIAL BIOPSIES;  Surgeon: Garner Nash, DO;  Location: [facility];  Service: Pulmonary;;  . BRONCHIAL NEEDLE ASPIRATION BIOPSY  12/14/2020   Procedure: BRONCHIAL NEEDLE ASPIRATION BIOPSIES;  Surgeon: Garner Nash, DO;  Location: [facility];  Service: Pulmonary;;  . CARDIAC CATHETERIZATION N/A 11/21/2015   Procedure: Left Heart Cath and Coronary Angiography;  Surgeon: Wellington Hampshire, MD;  Location: [facility];  Service: Cardiovascular;  Laterality: N/A;  . EP IMPLANTABLE DEVICE N/A 11/29/2015   Procedure: ICD Implant;  Surgeon: Deboraha Sprang, MD;  Location: [facility];  Service: Cardiovascular;  Laterality: N/A;  . INGUINAL HERNIA REPAIR Left 02/09/2017   Procedure: OPEN REPAIR LEFT INGUINAL HERNIA ;  Surgeon: Greer Pickerel, MD;  Location: [facility];  Service: General;  Laterality: Left;  . INSERTION OF MESH Left 02/09/2017   Procedure: INSERTION OF MESH;  Surgeon: Greer Pickerel, MD;  Location: [facility];  Service: General;  Laterality:  Left;  . VIDEO BRONCHOSCOPY WITH ENDOBRONCHIAL ULTRASOUND N/A 12/14/2020   Procedure: VIDEO BRONCHOSCOPY WITH ENDOBRONCHIAL ULTRASOUND;  Surgeon: Garner Nash, DO;  Location: [facility];  Service: Pulmonary;  Laterality: N/A;       Family History  Problem Relation Age of Onset  . Hypertension Other     Social History   Tobacco Use  . Smoking status: Former Smoker    Packs/day: 1.00    Years: 20.00    Pack years: 20.00    Types: Cigarettes    Quit date: 12/12/2007    Years since quitting: 13.0   . Smokeless tobacco: Never Used  Substance Use Topics  . Alcohol use: Yes    Comment: occ  . Drug use: No    Home Medications Prior to Admission medications   Medication Sig Start Date End Date Taking? Authorizing Provider  albuterol (PROVENTIL) (2.5 MG/3ML) 0.083% nebulizer solution Take 3 mLs (2.5 mg total) by nebulization every 6 (six) hours as needed for wheezing or shortness of breath. 12/09/20   Icard, Octavio Graves, DO  albuterol (VENTOLIN HFA) 108 (90 Base) MCG/ACT inhaler Inhale 2 puffs into the lungs every 6 (six) hours as needed for wheezing or shortness of breath. 11/29/20   Owens Shark, NP  carvedilol (COREG) 25 MG tablet TAKE 1/2 TABLET BY MOUTH TWICE A DAY WITH MEAL 12/13/20   Biagio Borg, MD  cholecalciferol (VITAMIN D3) 25 MCG (1000 UNIT) tablet Take 3,000 Units by mouth daily.    [provider]  diltiazem (CARDIZEM CD) 180 MG 24 hr capsule Take 1 capsule (180 mg total) by mouth daily. 10/13/20   Biagio Borg, MD  ELIQUIS 5 MG TABS tablet TAKE 1 TABLET BY MOUTH TWICE A DAY 12/13/20   Biagio Borg, MD  Fluticasone-Umeclidin-Vilant (TRELEGY ELLIPTA) 100-62.5-25 MCG/INH AEPB Inhale 1 puff into the lungs daily. 12/08/20   Icard, Octavio Graves, DO  Fluticasone-Umeclidin-Vilant (TRELEGY ELLIPTA) 100-62.5-25 MCG/INH AEPB Inhale 1 puff into the lungs daily. Patient not taking: No sig reported 12/08/20   Icard, Leory Plowman L, DO  HYDROcodone-acetaminophen (NORCO/VICODIN) 5-325 MG tablet Take 1 tablet by mouth every 6 (six) hours as needed for moderate pain. 01/03/21   Ladell Pier, MD  ibuprofen (ADVIL,MOTRIN) 200 MG tablet Take 200 mg by mouth every 6 (six) hours as needed (Pain).    [provider]  LORazepam (ATIVAN) 0.5 MG tablet Take 1 tablet (0.5 mg total) by mouth every 8 (eight) hours as needed for anxiety or sleep. 01/05/21   Owens Shark, NP  Multiple Vitamins-Minerals (MULTIVITAMIN WITH MINERALS) tablet Take 1 tablet by mouth daily.    [provider]   predniSONE (DELTASONE) 5 MG tablet Take 40mg  (8 tablets) for 2 days. Then take 30mg  (6 tablets) for 2 days. Then take 20mg  (4 tablets) for 2 days. Then take 10 mg (2 tablets) for 2 days then STOP. 12/30/20   Ladell Pier, MD  prochlorperazine (COMPAZINE) 5 MG tablet Take 1 tablet (5 mg total) by mouth every 6 (six) hours as needed for nausea or vomiting. 12/24/20   Owens Shark, NP  rosuvastatin (CRESTOR) 40 MG tablet TAKE 1 TABLET BY MOUTH EVERY DAY Patient taking differently: Take 40 mg by mouth daily. 10/18/20   Biagio Borg, MD    Allergies    Lipitor [atorvastatin] and Meperidine hcl  Review of Systems   Review of Systems  Constitutional: Negative for chills and fever.  HENT: Negative for ear pain and sore  throat.   Eyes: Negative for pain and visual disturbance.  Respiratory: Negative for cough and shortness of breath.   Cardiovascular: Positive for chest pain. Negative for palpitations.  Gastrointestinal: Negative for abdominal pain and vomiting.  Genitourinary: Negative for dysuria and hematuria.  Musculoskeletal: Negative for arthralgias and back pain.  Skin: Negative for color change and rash.  Neurological: Negative for seizures and syncope.  All other systems reviewed and are negative.   Physical Exam Updated Vital Signs BP 119/74 (BP Location: Right Arm)   Pulse (!) 115   Temp 98.7 F (37.1 C) (Oral)   Resp 19   Ht 6' (1.829 m)   Wt 77.1 kg   SpO2 94%   BMI 23.06 kg/m   Physical Exam Vitals and nursing note reviewed.  Constitutional:      Appearance: He is well-developed and well-nourished.     Comments: Chronically ill but no distress  HENT:     Head: Normocephalic and atraumatic.  Eyes:     Conjunctiva/sclera: Conjunctivae normal.  Cardiovascular:     Rate and Rhythm: Regular rhythm. Tachycardia present.     Heart sounds: No murmur heard.   Pulmonary:     Effort: Pulmonary effort is normal. No respiratory distress.     Breath sounds: Normal  breath sounds.  Abdominal:     Palpations: Abdomen is soft.     Tenderness: There is no abdominal tenderness.  Musculoskeletal:        General: No edema.     Cervical back: Neck supple.  Skin:    General: Skin is warm and dry.  Neurological:     Mental Status: He is alert.  Psychiatric:        Mood and Affect: Mood and affect normal.     ED Results / Procedures / Treatments   Labs (all labs ordered are listed, but only abnormal results are displayed) Labs Reviewed  SARS CORONAVIRUS 2 BY RT PCR (HOSPITAL ORDER, [facility])  CBC WITH DIFFERENTIAL/PLATELET  BASIC METABOLIC PANEL  MAGNESIUM  POC SARS CORONAVIRUS 2 AG -  ED    EKG None  Radiology No results found.  Procedures .Critical Care Performed by: Lucrezia Starch, MD Authorized by: Lucrezia Starch, MD   Critical care provider statement:    Critical care time (minutes):  40   Critical care was necessary to treat or prevent imminent or life-threatening deterioration of the following conditions:  Cardiac failure   Critical care was time spent personally by me on the following activities:  Discussions with consultants, evaluation of patient's response to treatment, examination of patient, ordering and performing treatments and interventions, ordering and review of laboratory studies, ordering and review of radiographic studies, pulse oximetry, re-evaluation of patient's condition, obtaining history from patient or surrogate and review of old charts     Medications Ordered in ED Medications  acetaminophen (TYLENOL) tablet 1,000 mg (1,000 mg Oral Given 01/11/21 1449)  LORazepam (ATIVAN) tablet 1 mg (1 mg Oral Given 01/11/21 1449)    ED Course  I have reviewed the triage vital signs and the nursing notes.  Pertinent labs & imaging results that were available during my care of the patient were reviewed by me and considered in my medical decision making (see chart for details).    MDM  Rules/Calculators/A&P                         65 year old male with history of  lung and liver cancer (disease of undetermined primary), A. fib, cardiac arrest post ICD placement ([place]) presents to ER after device firing.  On arrival here, patient looks well, no distress, vitals noted for fever, tachycardia but stable BP.  Recently enrolled in hospice care.  Daughter, wife and patient are all in agreement that his goals of care are focused on comfort measures and do not want any aggressive interventions.  They would like his device turned off.  Device rep turned off. States episodes were from rapid atrial tachycardia and not VT or VF. Regarding fever, etiology not clear at this time.  I discussed admission for IV antibiotics, broad sepsis work-up however given his broader goals of care, they stated for this issue as well his goals are only related to comfort and do not want aggressive care, IV antibiotics, etc.  Therefore, did not call sepsis alert and did not start empiric antibiotics.  Did place orders for some basic labs and Covid testing.  Given events from today, decision to pursue comfort measures, believe patient would benefit from admission for observation this evening, consultation with palliative care.  While awaiting callback from palliative, basic labs and Covid testing, signed out to Dr. Ron Parker.   Final Clinical Impression(s) / ED Diagnoses Final diagnoses:  Fever, unspecified fever cause  Atrial tachycardia ([place])  ICD (implantable cardioverter-defibrillator) discharge  Palliative care encounter    Rx / DC Orders ED Discharge Orders    None       Lucrezia Starch, MD 01/11/21 1528

## 2021-01-11 NOTE — ED Provider Notes (Signed)
With patient has been under our care and wishes to be discharged home.  He understands that he could potentially have a life-threatening infection.  And with his ICD turned off he could have a life-threatening arrhythmia.  Given his metastatic disease in his family wishes as well as his wishes, he is willing to take these risks to be home and focus on his comfort.  The palliative care team says he has home hospice they can provide respite care and comfort care for this patient at home.  They recommend discharge home.  Review of his labs shows no significant derangements other than his leukocytosis which appears chronic chronic anemia.  Some minor chronic metabolic changes.  I feel that given his wishes and his mental status being normal if he wants to make this choice to be discharged home for comfort care measures and his family agrees and it is safe to do so.  He is invited to come back anytime for reevaluation.  He will be discharged.   Breck Coons, MD 01/11/21 [phone]

## 2021-01-11 NOTE — ED Triage Notes (Signed)
Pt arrives from home via EMS with complaints of defibulator shocks upwards of 20 times per pt report. Pt was sitting when it began to fire.  Pt alert and oriented X4. Pt states he wants the device removed today.  Recent dx of stage 4 lung and liver cancer.   125/80 HR 120 RR 16 97% RA

## 2021-01-12 DIAGNOSIS — I471 Supraventricular tachycardia: Secondary | ICD-10-CM | POA: Insufficient documentation

## 2021-01-12 DIAGNOSIS — I10 Essential (primary) hypertension: Secondary | ICD-10-CM | POA: Diagnosis not present

## 2021-01-12 DIAGNOSIS — C229 Malignant neoplasm of liver, not specified as primary or secondary: Secondary | ICD-10-CM | POA: Diagnosis not present

## 2021-01-12 DIAGNOSIS — Z4502 Encounter for adjustment and management of automatic implantable cardiac defibrillator: Secondary | ICD-10-CM | POA: Insufficient documentation

## 2021-01-12 DIAGNOSIS — C3411 Malignant neoplasm of upper lobe, right bronchus or lung: Secondary | ICD-10-CM | POA: Diagnosis not present

## 2021-01-12 DIAGNOSIS — J302 Other seasonal allergic rhinitis: Secondary | ICD-10-CM | POA: Diagnosis not present

## 2021-01-12 DIAGNOSIS — J449 Chronic obstructive pulmonary disease, unspecified: Secondary | ICD-10-CM | POA: Diagnosis not present

## 2021-01-12 DIAGNOSIS — K922 Gastrointestinal hemorrhage, unspecified: Secondary | ICD-10-CM | POA: Diagnosis not present

## 2021-01-12 DIAGNOSIS — Z515 Encounter for palliative care: Secondary | ICD-10-CM | POA: Insufficient documentation

## 2021-01-12 DIAGNOSIS — Z6823 Body mass index (BMI) 23.0-23.9, adult: Secondary | ICD-10-CM | POA: Diagnosis not present

## 2021-01-12 DIAGNOSIS — Z95810 Presence of automatic (implantable) cardiac defibrillator: Secondary | ICD-10-CM | POA: Diagnosis not present

## 2021-01-12 DIAGNOSIS — E785 Hyperlipidemia, unspecified: Secondary | ICD-10-CM | POA: Diagnosis not present

## 2021-01-12 DIAGNOSIS — I4891 Unspecified atrial fibrillation: Secondary | ICD-10-CM | POA: Diagnosis not present

## 2021-01-12 DIAGNOSIS — R509 Fever, unspecified: Secondary | ICD-10-CM | POA: Insufficient documentation

## 2021-01-12 DIAGNOSIS — Z87891 Personal history of nicotine dependence: Secondary | ICD-10-CM | POA: Diagnosis not present

## 2021-01-12 DIAGNOSIS — I2510 Atherosclerotic heart disease of native coronary artery without angina pectoris: Secondary | ICD-10-CM | POA: Diagnosis not present

## 2021-01-13 DIAGNOSIS — I10 Essential (primary) hypertension: Secondary | ICD-10-CM | POA: Diagnosis not present

## 2021-01-13 DIAGNOSIS — I2510 Atherosclerotic heart disease of native coronary artery without angina pectoris: Secondary | ICD-10-CM | POA: Diagnosis not present

## 2021-01-13 DIAGNOSIS — C229 Malignant neoplasm of liver, not specified as primary or secondary: Secondary | ICD-10-CM | POA: Diagnosis not present

## 2021-01-13 DIAGNOSIS — K922 Gastrointestinal hemorrhage, unspecified: Secondary | ICD-10-CM | POA: Diagnosis not present

## 2021-01-13 DIAGNOSIS — Z6823 Body mass index (BMI) 23.0-23.9, adult: Secondary | ICD-10-CM | POA: Diagnosis not present

## 2021-01-13 DIAGNOSIS — Z87891 Personal history of nicotine dependence: Secondary | ICD-10-CM | POA: Diagnosis not present

## 2021-01-13 DIAGNOSIS — J449 Chronic obstructive pulmonary disease, unspecified: Secondary | ICD-10-CM | POA: Diagnosis not present

## 2021-01-13 DIAGNOSIS — I4891 Unspecified atrial fibrillation: Secondary | ICD-10-CM | POA: Diagnosis not present

## 2021-01-13 DIAGNOSIS — C3411 Malignant neoplasm of upper lobe, right bronchus or lung: Secondary | ICD-10-CM | POA: Diagnosis not present

## 2021-01-13 DIAGNOSIS — E785 Hyperlipidemia, unspecified: Secondary | ICD-10-CM | POA: Diagnosis not present

## 2021-01-13 DIAGNOSIS — J302 Other seasonal allergic rhinitis: Secondary | ICD-10-CM | POA: Diagnosis not present

## 2021-01-13 DIAGNOSIS — Z95810 Presence of automatic (implantable) cardiac defibrillator: Secondary | ICD-10-CM | POA: Diagnosis not present

## 2021-01-14 ENCOUNTER — Other Ambulatory Visit: Payer: Self-pay | Admitting: Internal Medicine

## 2021-01-14 DIAGNOSIS — I2510 Atherosclerotic heart disease of native coronary artery without angina pectoris: Secondary | ICD-10-CM | POA: Diagnosis not present

## 2021-01-14 DIAGNOSIS — C229 Malignant neoplasm of liver, not specified as primary or secondary: Secondary | ICD-10-CM | POA: Diagnosis not present

## 2021-01-14 DIAGNOSIS — Z6823 Body mass index (BMI) 23.0-23.9, adult: Secondary | ICD-10-CM | POA: Diagnosis not present

## 2021-01-14 DIAGNOSIS — Z95810 Presence of automatic (implantable) cardiac defibrillator: Secondary | ICD-10-CM | POA: Diagnosis not present

## 2021-01-14 DIAGNOSIS — J302 Other seasonal allergic rhinitis: Secondary | ICD-10-CM | POA: Diagnosis not present

## 2021-01-14 DIAGNOSIS — C3411 Malignant neoplasm of upper lobe, right bronchus or lung: Secondary | ICD-10-CM | POA: Diagnosis not present

## 2021-01-14 DIAGNOSIS — I10 Essential (primary) hypertension: Secondary | ICD-10-CM | POA: Diagnosis not present

## 2021-01-14 DIAGNOSIS — K922 Gastrointestinal hemorrhage, unspecified: Secondary | ICD-10-CM | POA: Diagnosis not present

## 2021-01-14 DIAGNOSIS — I4891 Unspecified atrial fibrillation: Secondary | ICD-10-CM | POA: Diagnosis not present

## 2021-01-14 DIAGNOSIS — Z87891 Personal history of nicotine dependence: Secondary | ICD-10-CM | POA: Diagnosis not present

## 2021-01-14 DIAGNOSIS — E785 Hyperlipidemia, unspecified: Secondary | ICD-10-CM | POA: Diagnosis not present

## 2021-01-14 DIAGNOSIS — J449 Chronic obstructive pulmonary disease, unspecified: Secondary | ICD-10-CM | POA: Diagnosis not present

## 2021-01-15 DIAGNOSIS — J449 Chronic obstructive pulmonary disease, unspecified: Secondary | ICD-10-CM | POA: Diagnosis not present

## 2021-01-15 DIAGNOSIS — C229 Malignant neoplasm of liver, not specified as primary or secondary: Secondary | ICD-10-CM | POA: Diagnosis not present

## 2021-01-15 DIAGNOSIS — Z87891 Personal history of nicotine dependence: Secondary | ICD-10-CM | POA: Diagnosis not present

## 2021-01-15 DIAGNOSIS — J302 Other seasonal allergic rhinitis: Secondary | ICD-10-CM | POA: Diagnosis not present

## 2021-01-15 DIAGNOSIS — Z95810 Presence of automatic (implantable) cardiac defibrillator: Secondary | ICD-10-CM | POA: Diagnosis not present

## 2021-01-15 DIAGNOSIS — I10 Essential (primary) hypertension: Secondary | ICD-10-CM | POA: Diagnosis not present

## 2021-01-15 DIAGNOSIS — C3411 Malignant neoplasm of upper lobe, right bronchus or lung: Secondary | ICD-10-CM | POA: Diagnosis not present

## 2021-01-15 DIAGNOSIS — Z6823 Body mass index (BMI) 23.0-23.9, adult: Secondary | ICD-10-CM | POA: Diagnosis not present

## 2021-01-15 DIAGNOSIS — E785 Hyperlipidemia, unspecified: Secondary | ICD-10-CM | POA: Diagnosis not present

## 2021-01-15 DIAGNOSIS — I4891 Unspecified atrial fibrillation: Secondary | ICD-10-CM | POA: Diagnosis not present

## 2021-01-15 DIAGNOSIS — K922 Gastrointestinal hemorrhage, unspecified: Secondary | ICD-10-CM | POA: Diagnosis not present

## 2021-01-15 DIAGNOSIS — I2510 Atherosclerotic heart disease of native coronary artery without angina pectoris: Secondary | ICD-10-CM | POA: Diagnosis not present

## 2021-01-16 DIAGNOSIS — R404 Transient alteration of awareness: Secondary | ICD-10-CM | POA: Diagnosis not present

## 2021-01-16 DIAGNOSIS — R0902 Hypoxemia: Secondary | ICD-10-CM | POA: Diagnosis not present

## 2021-01-16 DIAGNOSIS — Z6823 Body mass index (BMI) 23.0-23.9, adult: Secondary | ICD-10-CM | POA: Diagnosis not present

## 2021-01-16 DIAGNOSIS — J449 Chronic obstructive pulmonary disease, unspecified: Secondary | ICD-10-CM | POA: Diagnosis not present

## 2021-01-16 DIAGNOSIS — K922 Gastrointestinal hemorrhage, unspecified: Secondary | ICD-10-CM | POA: Diagnosis not present

## 2021-01-16 DIAGNOSIS — I4891 Unspecified atrial fibrillation: Secondary | ICD-10-CM | POA: Diagnosis not present

## 2021-01-16 DIAGNOSIS — Z87891 Personal history of nicotine dependence: Secondary | ICD-10-CM | POA: Diagnosis not present

## 2021-01-16 DIAGNOSIS — C229 Malignant neoplasm of liver, not specified as primary or secondary: Secondary | ICD-10-CM | POA: Diagnosis not present

## 2021-01-16 DIAGNOSIS — Z95810 Presence of automatic (implantable) cardiac defibrillator: Secondary | ICD-10-CM | POA: Diagnosis not present

## 2021-01-16 DIAGNOSIS — E785 Hyperlipidemia, unspecified: Secondary | ICD-10-CM | POA: Diagnosis not present

## 2021-01-16 DIAGNOSIS — Z743 Need for continuous supervision: Secondary | ICD-10-CM | POA: Diagnosis not present

## 2021-01-16 DIAGNOSIS — R279 Unspecified lack of coordination: Secondary | ICD-10-CM | POA: Diagnosis not present

## 2021-01-16 DIAGNOSIS — C3411 Malignant neoplasm of upper lobe, right bronchus or lung: Secondary | ICD-10-CM | POA: Diagnosis not present

## 2021-01-16 DIAGNOSIS — J302 Other seasonal allergic rhinitis: Secondary | ICD-10-CM | POA: Diagnosis not present

## 2021-01-16 DIAGNOSIS — I10 Essential (primary) hypertension: Secondary | ICD-10-CM | POA: Diagnosis not present

## 2021-01-16 DIAGNOSIS — I2510 Atherosclerotic heart disease of native coronary artery without angina pectoris: Secondary | ICD-10-CM | POA: Diagnosis not present

## 2021-01-17 DIAGNOSIS — Z87891 Personal history of nicotine dependence: Secondary | ICD-10-CM | POA: Diagnosis not present

## 2021-01-17 DIAGNOSIS — I10 Essential (primary) hypertension: Secondary | ICD-10-CM | POA: Diagnosis not present

## 2021-01-17 DIAGNOSIS — I2510 Atherosclerotic heart disease of native coronary artery without angina pectoris: Secondary | ICD-10-CM | POA: Diagnosis not present

## 2021-01-17 DIAGNOSIS — C3411 Malignant neoplasm of upper lobe, right bronchus or lung: Secondary | ICD-10-CM | POA: Diagnosis not present

## 2021-01-17 DIAGNOSIS — E785 Hyperlipidemia, unspecified: Secondary | ICD-10-CM | POA: Diagnosis not present

## 2021-01-17 DIAGNOSIS — Z95810 Presence of automatic (implantable) cardiac defibrillator: Secondary | ICD-10-CM | POA: Diagnosis not present

## 2021-01-17 DIAGNOSIS — Z6823 Body mass index (BMI) 23.0-23.9, adult: Secondary | ICD-10-CM | POA: Diagnosis not present

## 2021-01-17 DIAGNOSIS — C229 Malignant neoplasm of liver, not specified as primary or secondary: Secondary | ICD-10-CM | POA: Diagnosis not present

## 2021-01-17 DIAGNOSIS — I4891 Unspecified atrial fibrillation: Secondary | ICD-10-CM | POA: Diagnosis not present

## 2021-01-17 DIAGNOSIS — J302 Other seasonal allergic rhinitis: Secondary | ICD-10-CM | POA: Diagnosis not present

## 2021-01-17 DIAGNOSIS — K922 Gastrointestinal hemorrhage, unspecified: Secondary | ICD-10-CM | POA: Diagnosis not present

## 2021-01-17 DIAGNOSIS — J449 Chronic obstructive pulmonary disease, unspecified: Secondary | ICD-10-CM | POA: Diagnosis not present

## 2021-02-02 ENCOUNTER — Ambulatory Visit: Payer: BC Managed Care – PPO | Admitting: Pulmonary Disease

## 2021-02-07 ENCOUNTER — Telehealth: Payer: Self-pay

## 2021-02-07 NOTE — Telephone Encounter (Signed)
Patient marked "E" in [place].

## 2021-05-11 ENCOUNTER — Telehealth: Payer: Self-pay | Admitting: Emergency Medicine

## 2021-06-10 NOTE — Telephone Encounter (Signed)
Received transmission after patient was deceased. Patient was cremated and ICD in room with monitor. Monitor will be unplugged and disposed of. Expressed condolences for loss of patient .

## 2021-06-10 DEATH — deceased

## 2022-09-18 IMAGING — CT CT CHEST W/ CM
2 of 4 series · 15 of 46 positions shown, 17 images · IV contrast (omnipaque)
Comparison: Ultrasound exam earlier same day.

CLINICAL DATA: Cancer of unknown primary.  Right-sided pain.

EXAM:
CT CHEST, ABDOMEN, AND PELVIS WITH CONTRAST
TECHNIQUE: Multidetector CT imaging of the chest, abdomen and pelvis was
performed following the standard protocol during bolus
administration of intravenous contrast.
CONTRAST:  100mL OMNIPAQUE IOHEXOL 300 MG/ML  SOLN

[Series 5: lungs · axial · 0.67mm/px · z∈[-293,-37]mm · 12 of 146 slices shown, 14 images]
[im 9/146  soft-tissue]
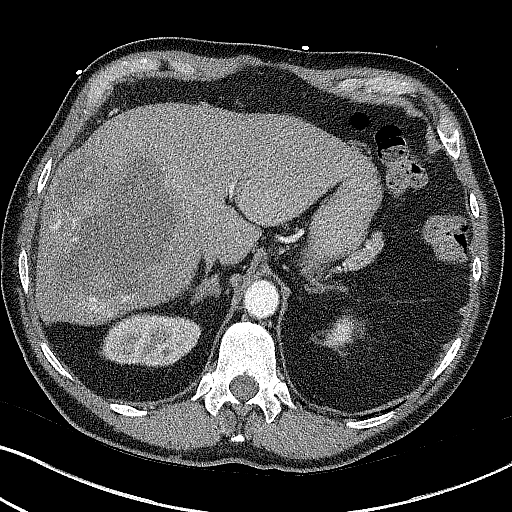
[im 9/146  bone]
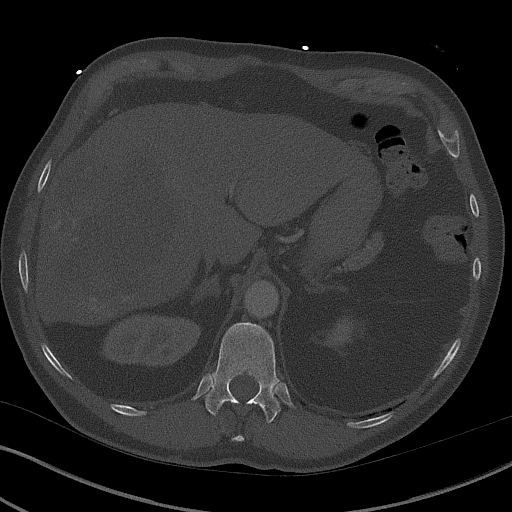
[im 25/146  soft-tissue]
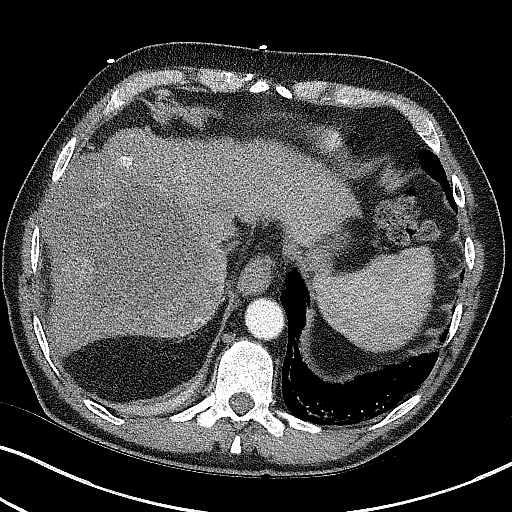
[im 33/146  soft-tissue]
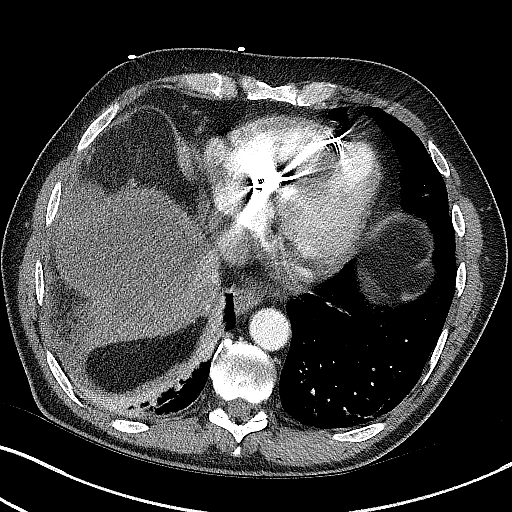
[im 41/146  soft-tissue]
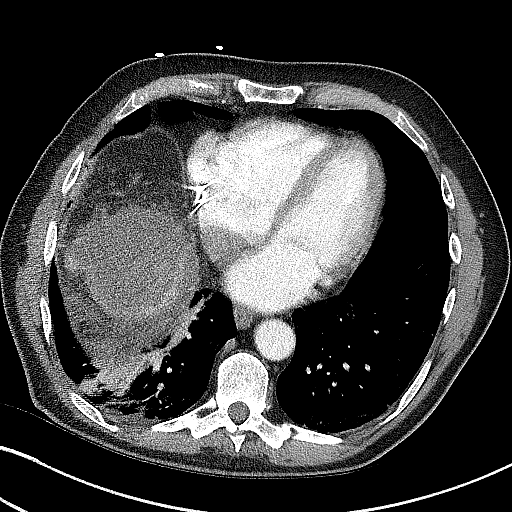
[im 57/146  soft-tissue]
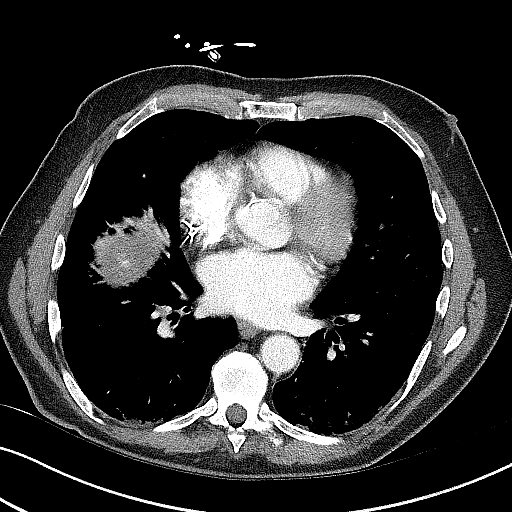
[im 65/146  soft-tissue]
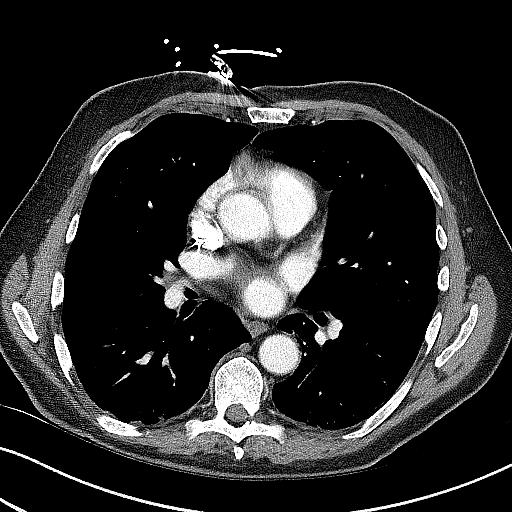
[im 81/146  soft-tissue]
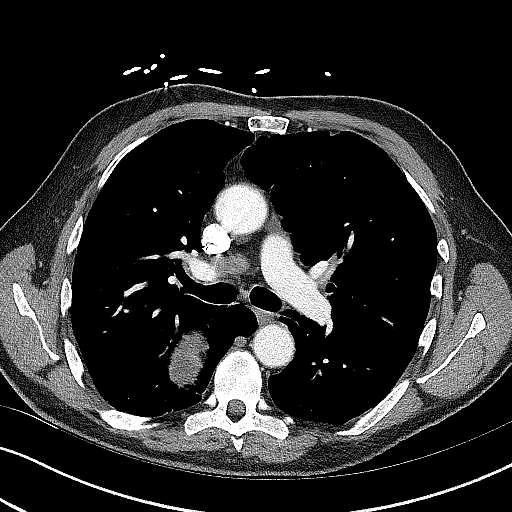
[im 89/146  soft-tissue]
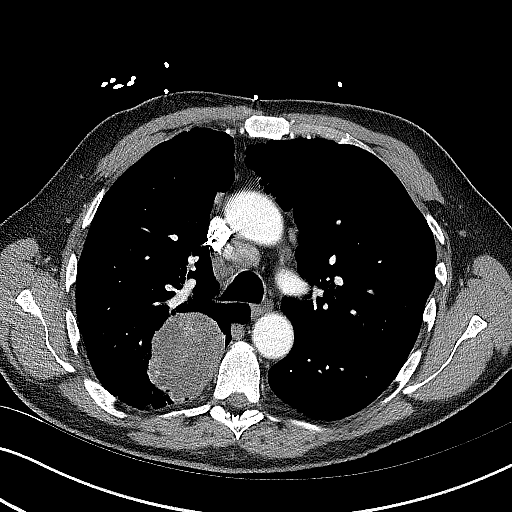
[im 105/146  soft-tissue]
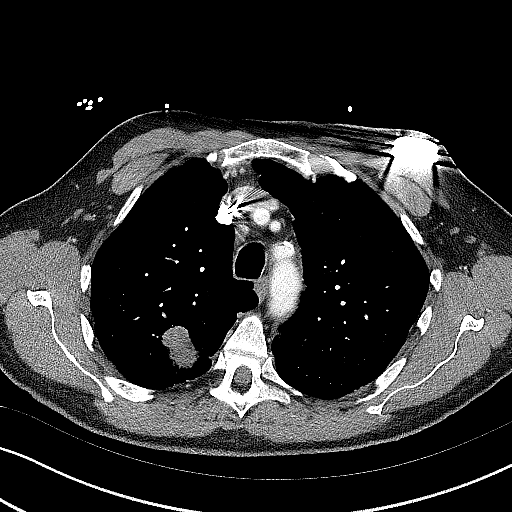
[im 105/146  bone]
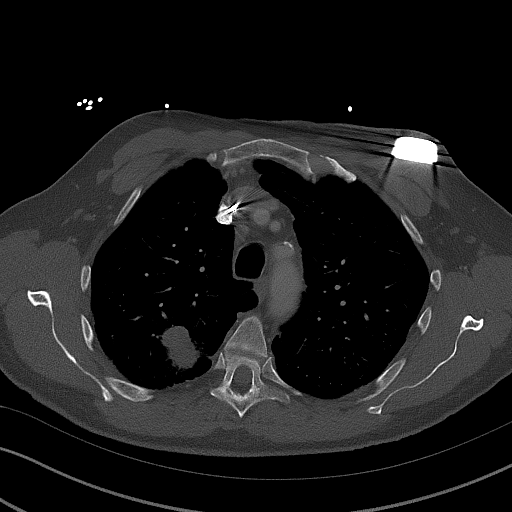
[im 113/146  soft-tissue]
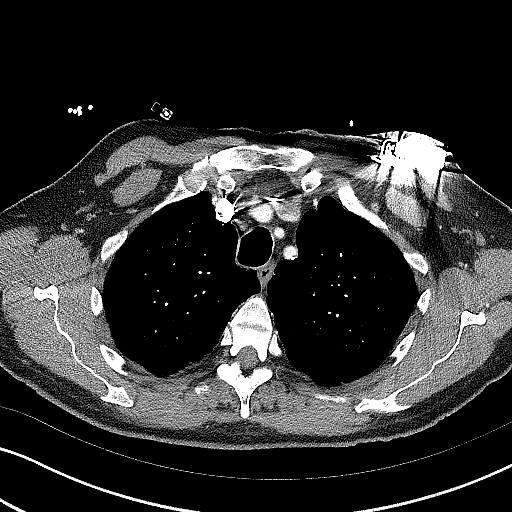
[im 121/146  soft-tissue]
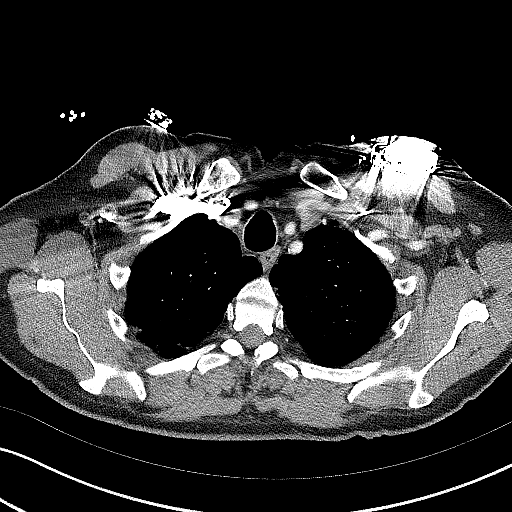
[im 137/146  soft-tissue]
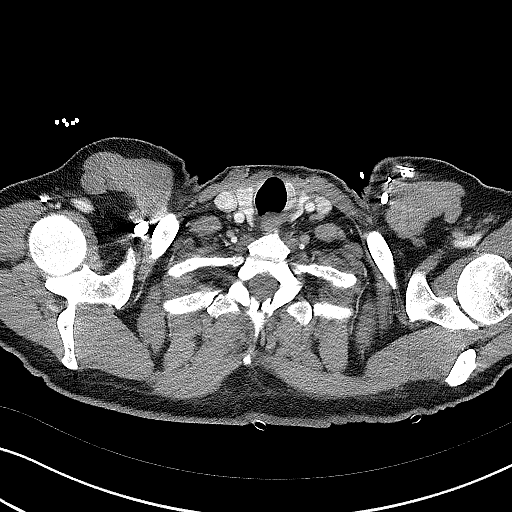

[Series 6: cor post · coronal · 0.65mm/px · 3 of 100 slices shown]
[im 34/100  soft-tissue]
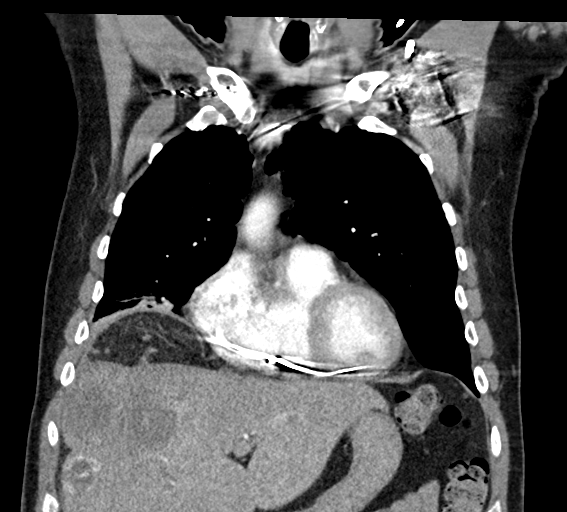
[im 45/100  soft-tissue]
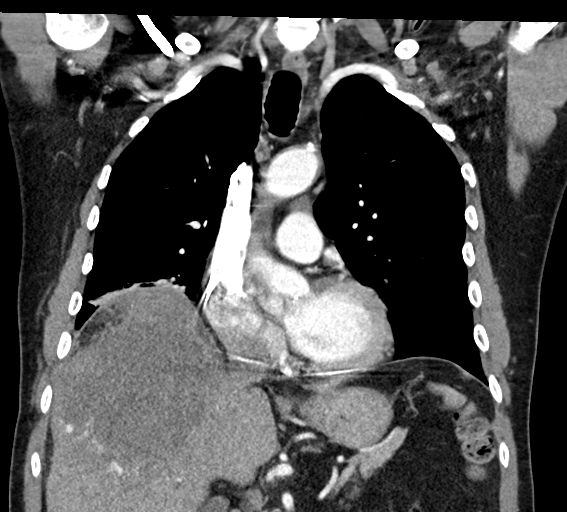
[im 56/100  soft-tissue]
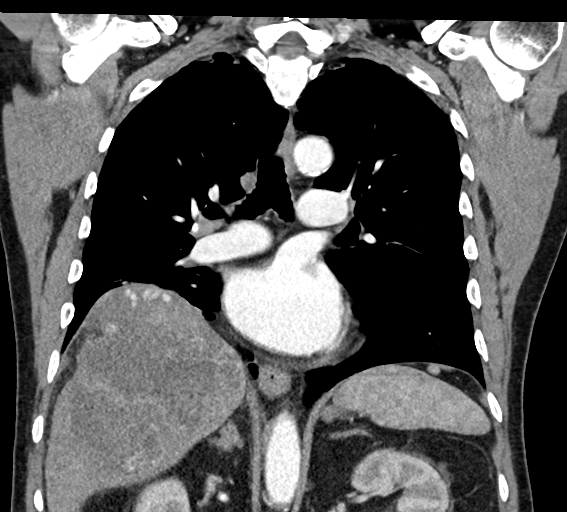

[15 of 46 positions shown; findings below may reference images not displayed]

FINDINGS: CT CHEST FINDINGS

Cardiovascular: The heart size is normal. No substantial pericardial
effusion. Atherosclerotic calcification is noted in the wall of the
thoracic aorta. Left permanent pacemaker noted.

Mediastinum/Nodes: 14 mm short axis right paratracheal node is noted
on 35/3. Precarinal lymphadenopathy measures up to 2.1 cm short
axis. 13 mm short axis right hilar node identified. No left hilar
lymphadenopathy. There is no axillary lymphadenopathy.The esophagus
has normal imaging features.

Lungs/Pleura: Centrilobular and paraseptal emphysema evident. 5.9 x
5.1 x 5.3 cm posterior right upper lobe pulmonary mass is contiguous
with the posterior aspect of the major fissure. Atelectasis noted in
the lower lobes bilaterally. No substantial pleural effusion.

Musculoskeletal: No worrisome lytic or sclerotic osseous
abnormality.

CT ABDOMEN PELVIS FINDINGS

Hepatobiliary: Large 12.7 x 11.6 x 12.4 cm irregular multi lobular
mass lesion versus collection of clustered smaller lesions
identified in the upper liver, largely replacing segments VII and
VIII. Lesion shows irregular peripheral enhancement. 2.8 cm
subcapsular lesion noted lateral right liver on 51/8. There is
probably a tiny 8 mm lesion in the inferior right liver on 73/8.
There is no evidence for gallstones, gallbladder wall thickening, or
pericholecystic fluid. No intrahepatic or extrahepatic biliary
dilation.

Pancreas: No focal mass lesion. No dilatation of the main duct. No
intraparenchymal cyst. No peripancreatic edema.

Spleen: No splenomegaly. No focal mass lesion.

Adrenals/Urinary Tract: No adrenal nodule or mass. Right kidney
unremarkable. 8 mm subcapsular lesion lower pole left kidney is too
small to characterize. No evidence for hydroureter. The urinary
bladder appears normal for the degree of distention.

Stomach/Bowel: Stomach is unremarkable. No gastric wall thickening.
No evidence of outlet obstruction. Duodenum is normally positioned
as is the ligament of Treitz. No small bowel wall thickening. No
small bowel dilatation. The terminal ileum is normal. The appendix
is not visualized, but there is no edema or inflammation in the
region of the cecum. No gross colonic mass. No colonic wall
thickening.

Vascular/Lymphatic: There is abdominal aortic atherosclerosis
without aneurysm. There is no gastrohepatic or hepatoduodenal
ligament lymphadenopathy. No retroperitoneal or mesenteric
lymphadenopathy. No pelvic sidewall lymphadenopathy.

Reproductive: The prostate gland and seminal vesicles are
unremarkable.

Other: No intraperitoneal free fluid.

Musculoskeletal: No worrisome lytic or sclerotic osseous
abnormality.
IMPRESSION: 1. 5.9 x 5.1 x 5.3 cm posterior right upper lobe pulmonary mass with
mediastinal and right hilar lymphadenopathy compatible with lymph
node metastases. Pulmonary lesion could represent primary
bronchogenic neoplasm or metastatic disease given findings in the
liver.
2. 12.7 x 11.6 x 12.4 cm irregular multi lobular mass lesion versus
collection of clustered smaller lesions in the upper liver, largely
replacing segments VII and VIII. Imaging features highly concerning
for neoplasm, likely metastatic disease although primary hepatic
neoplasm not excluded. Additional smaller lesions in the liver
suggest metastatic involvement.
3. 8 mm subcapsular lesion lower pole left kidney is too small to
characterize. Attention on follow-up recommended.
4. Aortic Atherosclerosis (OQ7WN-VCW.W) and Emphysema ([id number].4).
